# Patient Record
Sex: Female | Born: 1955
Health system: Southern US, Community
[De-identification: ages and names within clinical notes are randomized; demographics above are authoritative.]

## PROBLEM LIST (undated history)

## (undated) DIAGNOSIS — E079 Disorder of thyroid, unspecified: Secondary | ICD-10-CM

## (undated) DIAGNOSIS — N809 Endometriosis, unspecified: Secondary | ICD-10-CM

## (undated) DIAGNOSIS — D649 Anemia, unspecified: Secondary | ICD-10-CM

## (undated) DIAGNOSIS — J45909 Unspecified asthma, uncomplicated: Secondary | ICD-10-CM

## (undated) DIAGNOSIS — I10 Essential (primary) hypertension: Secondary | ICD-10-CM

## (undated) DIAGNOSIS — E119 Type 2 diabetes mellitus without complications: Secondary | ICD-10-CM

## (undated) DIAGNOSIS — K219 Gastro-esophageal reflux disease without esophagitis: Secondary | ICD-10-CM

## (undated) DIAGNOSIS — E039 Hypothyroidism, unspecified: Secondary | ICD-10-CM

## (undated) DIAGNOSIS — F419 Anxiety disorder, unspecified: Secondary | ICD-10-CM

## (undated) DIAGNOSIS — J449 Chronic obstructive pulmonary disease, unspecified: Secondary | ICD-10-CM

## (undated) DIAGNOSIS — F32A Depression, unspecified: Secondary | ICD-10-CM

## (undated) DIAGNOSIS — G4730 Sleep apnea, unspecified: Secondary | ICD-10-CM

## (undated) DIAGNOSIS — K259 Gastric ulcer, unspecified as acute or chronic, without hemorrhage or perforation: Secondary | ICD-10-CM

## (undated) HISTORY — DX: Chronic obstructive pulmonary disease, unspecified: J44.9

## (undated) HISTORY — DX: Endometriosis, unspecified: N80.9

## (undated) HISTORY — DX: Disorder of thyroid, unspecified: E07.9

## (undated) HISTORY — DX: Essential (primary) hypertension: I10

## (undated) HISTORY — DX: Anxiety disorder, unspecified: F41.9

## (undated) HISTORY — DX: Type 2 diabetes mellitus without complications: E11.9

## (undated) HISTORY — DX: Depression, unspecified: F32.A

## (undated) HISTORY — PX: TUBAL LIGATION: SHX77

## (undated) HISTORY — DX: Anemia, unspecified: D64.9

---

## 2016-07-10 DIAGNOSIS — Z6841 Body Mass Index (BMI) 40.0 and over, adult: Secondary | ICD-10-CM | POA: Diagnosis not present

## 2016-07-10 DIAGNOSIS — E119 Type 2 diabetes mellitus without complications: Secondary | ICD-10-CM | POA: Diagnosis not present

## 2016-07-10 DIAGNOSIS — J9610 Chronic respiratory failure, unspecified whether with hypoxia or hypercapnia: Secondary | ICD-10-CM | POA: Diagnosis not present

## 2016-07-10 DIAGNOSIS — K279 Peptic ulcer, site unspecified, unspecified as acute or chronic, without hemorrhage or perforation: Secondary | ICD-10-CM | POA: Diagnosis not present

## 2016-07-10 DIAGNOSIS — I10 Essential (primary) hypertension: Secondary | ICD-10-CM | POA: Diagnosis not present

## 2016-07-10 DIAGNOSIS — E039 Hypothyroidism, unspecified: Secondary | ICD-10-CM | POA: Diagnosis not present

## 2016-07-10 DIAGNOSIS — J439 Emphysema, unspecified: Secondary | ICD-10-CM | POA: Diagnosis not present

## 2016-07-10 DIAGNOSIS — Z1389 Encounter for screening for other disorder: Secondary | ICD-10-CM | POA: Diagnosis not present

## 2016-07-10 DIAGNOSIS — Z23 Encounter for immunization: Secondary | ICD-10-CM | POA: Diagnosis not present

## 2016-08-07 DIAGNOSIS — J439 Emphysema, unspecified: Secondary | ICD-10-CM | POA: Diagnosis not present

## 2016-08-07 DIAGNOSIS — Z6841 Body Mass Index (BMI) 40.0 and over, adult: Secondary | ICD-10-CM | POA: Diagnosis not present

## 2016-08-07 DIAGNOSIS — E7800 Pure hypercholesterolemia, unspecified: Secondary | ICD-10-CM | POA: Diagnosis not present

## 2016-08-07 DIAGNOSIS — I10 Essential (primary) hypertension: Secondary | ICD-10-CM | POA: Diagnosis not present

## 2016-08-07 DIAGNOSIS — E039 Hypothyroidism, unspecified: Secondary | ICD-10-CM | POA: Diagnosis not present

## 2016-08-07 DIAGNOSIS — F329 Major depressive disorder, single episode, unspecified: Secondary | ICD-10-CM | POA: Diagnosis not present

## 2016-08-07 DIAGNOSIS — E119 Type 2 diabetes mellitus without complications: Secondary | ICD-10-CM | POA: Diagnosis not present

## 2016-12-09 DIAGNOSIS — Z6839 Body mass index (BMI) 39.0-39.9, adult: Secondary | ICD-10-CM | POA: Diagnosis not present

## 2016-12-09 DIAGNOSIS — E119 Type 2 diabetes mellitus without complications: Secondary | ICD-10-CM | POA: Diagnosis not present

## 2016-12-09 DIAGNOSIS — E039 Hypothyroidism, unspecified: Secondary | ICD-10-CM | POA: Diagnosis not present

## 2016-12-09 DIAGNOSIS — F329 Major depressive disorder, single episode, unspecified: Secondary | ICD-10-CM | POA: Diagnosis not present

## 2016-12-09 DIAGNOSIS — Z1389 Encounter for screening for other disorder: Secondary | ICD-10-CM | POA: Diagnosis not present

## 2016-12-09 DIAGNOSIS — I10 Essential (primary) hypertension: Secondary | ICD-10-CM | POA: Diagnosis not present

## 2016-12-09 DIAGNOSIS — E669 Obesity, unspecified: Secondary | ICD-10-CM | POA: Diagnosis not present

## 2016-12-09 DIAGNOSIS — J439 Emphysema, unspecified: Secondary | ICD-10-CM | POA: Diagnosis not present

## 2017-01-12 DIAGNOSIS — E876 Hypokalemia: Secondary | ICD-10-CM | POA: Diagnosis not present

## 2017-04-14 DIAGNOSIS — J9610 Chronic respiratory failure, unspecified whether with hypoxia or hypercapnia: Secondary | ICD-10-CM | POA: Diagnosis not present

## 2017-04-14 DIAGNOSIS — Z0183 Encounter for blood typing: Secondary | ICD-10-CM | POA: Diagnosis not present

## 2017-04-14 DIAGNOSIS — J439 Emphysema, unspecified: Secondary | ICD-10-CM | POA: Diagnosis not present

## 2017-04-14 DIAGNOSIS — E039 Hypothyroidism, unspecified: Secondary | ICD-10-CM | POA: Diagnosis not present

## 2017-04-14 DIAGNOSIS — I10 Essential (primary) hypertension: Secondary | ICD-10-CM | POA: Diagnosis not present

## 2017-04-14 DIAGNOSIS — G250 Essential tremor: Secondary | ICD-10-CM | POA: Diagnosis not present

## 2017-04-14 DIAGNOSIS — E119 Type 2 diabetes mellitus without complications: Secondary | ICD-10-CM | POA: Diagnosis not present

## 2017-04-14 DIAGNOSIS — Z23 Encounter for immunization: Secondary | ICD-10-CM | POA: Diagnosis not present

## 2017-04-14 DIAGNOSIS — F329 Major depressive disorder, single episode, unspecified: Secondary | ICD-10-CM | POA: Diagnosis not present

## 2017-04-14 DIAGNOSIS — E7800 Pure hypercholesterolemia, unspecified: Secondary | ICD-10-CM | POA: Diagnosis not present

## 2017-08-20 DIAGNOSIS — E119 Type 2 diabetes mellitus without complications: Secondary | ICD-10-CM | POA: Diagnosis not present

## 2017-08-20 DIAGNOSIS — F329 Major depressive disorder, single episode, unspecified: Secondary | ICD-10-CM | POA: Diagnosis not present

## 2017-08-20 DIAGNOSIS — I10 Essential (primary) hypertension: Secondary | ICD-10-CM | POA: Diagnosis not present

## 2017-08-20 DIAGNOSIS — Z1211 Encounter for screening for malignant neoplasm of colon: Secondary | ICD-10-CM | POA: Diagnosis not present

## 2017-08-20 DIAGNOSIS — E7800 Pure hypercholesterolemia, unspecified: Secondary | ICD-10-CM | POA: Diagnosis not present

## 2017-08-20 DIAGNOSIS — M549 Dorsalgia, unspecified: Secondary | ICD-10-CM | POA: Diagnosis not present

## 2017-08-20 DIAGNOSIS — E039 Hypothyroidism, unspecified: Secondary | ICD-10-CM | POA: Diagnosis not present

## 2017-08-26 DIAGNOSIS — Z1211 Encounter for screening for malignant neoplasm of colon: Secondary | ICD-10-CM | POA: Diagnosis not present

## 2017-08-26 DIAGNOSIS — Z1212 Encounter for screening for malignant neoplasm of rectum: Secondary | ICD-10-CM | POA: Diagnosis not present

## 2017-10-01 DIAGNOSIS — Z1211 Encounter for screening for malignant neoplasm of colon: Secondary | ICD-10-CM | POA: Diagnosis not present

## 2017-10-14 DIAGNOSIS — K5732 Diverticulitis of large intestine without perforation or abscess without bleeding: Secondary | ICD-10-CM | POA: Diagnosis not present

## 2017-10-14 DIAGNOSIS — J449 Chronic obstructive pulmonary disease, unspecified: Secondary | ICD-10-CM | POA: Diagnosis not present

## 2017-10-14 DIAGNOSIS — D124 Benign neoplasm of descending colon: Secondary | ICD-10-CM | POA: Diagnosis not present

## 2017-10-14 DIAGNOSIS — E119 Type 2 diabetes mellitus without complications: Secondary | ICD-10-CM | POA: Diagnosis not present

## 2017-10-14 DIAGNOSIS — D127 Benign neoplasm of rectosigmoid junction: Secondary | ICD-10-CM | POA: Diagnosis not present

## 2017-10-14 DIAGNOSIS — K5730 Diverticulosis of large intestine without perforation or abscess without bleeding: Secondary | ICD-10-CM | POA: Diagnosis not present

## 2017-10-14 DIAGNOSIS — K5790 Diverticulosis of intestine, part unspecified, without perforation or abscess without bleeding: Secondary | ICD-10-CM | POA: Diagnosis not present

## 2017-10-14 DIAGNOSIS — R195 Other fecal abnormalities: Secondary | ICD-10-CM | POA: Diagnosis not present

## 2017-10-14 DIAGNOSIS — D369 Benign neoplasm, unspecified site: Secondary | ICD-10-CM | POA: Diagnosis not present

## 2017-10-14 DIAGNOSIS — K621 Rectal polyp: Secondary | ICD-10-CM | POA: Diagnosis not present

## 2017-10-14 DIAGNOSIS — K635 Polyp of colon: Secondary | ICD-10-CM | POA: Diagnosis not present

## 2017-10-14 DIAGNOSIS — K644 Residual hemorrhoidal skin tags: Secondary | ICD-10-CM | POA: Diagnosis not present

## 2017-10-14 DIAGNOSIS — K648 Other hemorrhoids: Secondary | ICD-10-CM | POA: Diagnosis not present

## 2017-10-14 DIAGNOSIS — Z1211 Encounter for screening for malignant neoplasm of colon: Secondary | ICD-10-CM | POA: Diagnosis not present

## 2017-10-14 DIAGNOSIS — E785 Hyperlipidemia, unspecified: Secondary | ICD-10-CM | POA: Diagnosis not present

## 2017-10-14 LAB — HM COLONOSCOPY

## 2017-11-25 DIAGNOSIS — Z6841 Body Mass Index (BMI) 40.0 and over, adult: Secondary | ICD-10-CM | POA: Diagnosis not present

## 2017-11-25 DIAGNOSIS — L259 Unspecified contact dermatitis, unspecified cause: Secondary | ICD-10-CM | POA: Diagnosis not present

## 2017-12-28 DIAGNOSIS — E119 Type 2 diabetes mellitus without complications: Secondary | ICD-10-CM | POA: Diagnosis not present

## 2017-12-28 DIAGNOSIS — Z79899 Other long term (current) drug therapy: Secondary | ICD-10-CM | POA: Diagnosis not present

## 2017-12-28 DIAGNOSIS — J439 Emphysema, unspecified: Secondary | ICD-10-CM | POA: Diagnosis not present

## 2017-12-28 DIAGNOSIS — Z1339 Encounter for screening examination for other mental health and behavioral disorders: Secondary | ICD-10-CM | POA: Diagnosis not present

## 2017-12-28 DIAGNOSIS — E039 Hypothyroidism, unspecified: Secondary | ICD-10-CM | POA: Diagnosis not present

## 2017-12-28 DIAGNOSIS — Z1331 Encounter for screening for depression: Secondary | ICD-10-CM | POA: Diagnosis not present

## 2017-12-28 DIAGNOSIS — Z1159 Encounter for screening for other viral diseases: Secondary | ICD-10-CM | POA: Diagnosis not present

## 2017-12-28 DIAGNOSIS — M549 Dorsalgia, unspecified: Secondary | ICD-10-CM | POA: Diagnosis not present

## 2017-12-28 DIAGNOSIS — I10 Essential (primary) hypertension: Secondary | ICD-10-CM | POA: Diagnosis not present

## 2017-12-28 DIAGNOSIS — E7800 Pure hypercholesterolemia, unspecified: Secondary | ICD-10-CM | POA: Diagnosis not present

## 2017-12-28 DIAGNOSIS — J9610 Chronic respiratory failure, unspecified whether with hypoxia or hypercapnia: Secondary | ICD-10-CM | POA: Diagnosis not present

## 2018-01-29 DIAGNOSIS — Z6841 Body Mass Index (BMI) 40.0 and over, adult: Secondary | ICD-10-CM | POA: Diagnosis not present

## 2018-01-29 DIAGNOSIS — L309 Dermatitis, unspecified: Secondary | ICD-10-CM | POA: Diagnosis not present

## 2018-01-29 DIAGNOSIS — R05 Cough: Secondary | ICD-10-CM | POA: Diagnosis not present

## 2018-01-29 DIAGNOSIS — E039 Hypothyroidism, unspecified: Secondary | ICD-10-CM | POA: Diagnosis not present

## 2018-01-29 DIAGNOSIS — F329 Major depressive disorder, single episode, unspecified: Secondary | ICD-10-CM | POA: Diagnosis not present

## 2018-01-29 DIAGNOSIS — J439 Emphysema, unspecified: Secondary | ICD-10-CM | POA: Diagnosis not present

## 2018-01-29 DIAGNOSIS — K219 Gastro-esophageal reflux disease without esophagitis: Secondary | ICD-10-CM | POA: Diagnosis not present

## 2018-03-02 DIAGNOSIS — J439 Emphysema, unspecified: Secondary | ICD-10-CM | POA: Diagnosis not present

## 2018-03-02 DIAGNOSIS — Z6841 Body Mass Index (BMI) 40.0 and over, adult: Secondary | ICD-10-CM | POA: Diagnosis not present

## 2018-03-02 DIAGNOSIS — L309 Dermatitis, unspecified: Secondary | ICD-10-CM | POA: Diagnosis not present

## 2018-03-02 DIAGNOSIS — Z79899 Other long term (current) drug therapy: Secondary | ICD-10-CM | POA: Diagnosis not present

## 2018-03-02 DIAGNOSIS — R05 Cough: Secondary | ICD-10-CM | POA: Diagnosis not present

## 2018-03-02 DIAGNOSIS — E1140 Type 2 diabetes mellitus with diabetic neuropathy, unspecified: Secondary | ICD-10-CM | POA: Diagnosis not present

## 2018-03-02 DIAGNOSIS — Z23 Encounter for immunization: Secondary | ICD-10-CM | POA: Diagnosis not present

## 2018-03-02 DIAGNOSIS — G5790 Unspecified mononeuropathy of unspecified lower limb: Secondary | ICD-10-CM | POA: Diagnosis not present

## 2018-03-02 DIAGNOSIS — K219 Gastro-esophageal reflux disease without esophagitis: Secondary | ICD-10-CM | POA: Diagnosis not present

## 2018-03-17 DIAGNOSIS — J439 Emphysema, unspecified: Secondary | ICD-10-CM | POA: Diagnosis not present

## 2018-03-17 DIAGNOSIS — J9610 Chronic respiratory failure, unspecified whether with hypoxia or hypercapnia: Secondary | ICD-10-CM | POA: Diagnosis not present

## 2018-03-17 DIAGNOSIS — L309 Dermatitis, unspecified: Secondary | ICD-10-CM | POA: Diagnosis not present

## 2018-03-17 DIAGNOSIS — M549 Dorsalgia, unspecified: Secondary | ICD-10-CM | POA: Diagnosis not present

## 2018-03-17 DIAGNOSIS — Z6841 Body Mass Index (BMI) 40.0 and over, adult: Secondary | ICD-10-CM | POA: Diagnosis not present

## 2018-05-06 DIAGNOSIS — J439 Emphysema, unspecified: Secondary | ICD-10-CM | POA: Diagnosis not present

## 2018-05-06 DIAGNOSIS — D229 Melanocytic nevi, unspecified: Secondary | ICD-10-CM | POA: Diagnosis not present

## 2018-05-06 DIAGNOSIS — Z6841 Body Mass Index (BMI) 40.0 and over, adult: Secondary | ICD-10-CM | POA: Diagnosis not present

## 2018-05-06 DIAGNOSIS — D179 Benign lipomatous neoplasm, unspecified: Secondary | ICD-10-CM | POA: Diagnosis not present

## 2018-05-13 DIAGNOSIS — J449 Chronic obstructive pulmonary disease, unspecified: Secondary | ICD-10-CM | POA: Diagnosis not present

## 2018-06-13 DIAGNOSIS — J449 Chronic obstructive pulmonary disease, unspecified: Secondary | ICD-10-CM | POA: Diagnosis not present

## 2018-07-06 DIAGNOSIS — E119 Type 2 diabetes mellitus without complications: Secondary | ICD-10-CM | POA: Diagnosis not present

## 2018-07-06 DIAGNOSIS — E039 Hypothyroidism, unspecified: Secondary | ICD-10-CM | POA: Diagnosis not present

## 2018-07-06 DIAGNOSIS — J9610 Chronic respiratory failure, unspecified whether with hypoxia or hypercapnia: Secondary | ICD-10-CM | POA: Diagnosis not present

## 2018-07-06 DIAGNOSIS — K219 Gastro-esophageal reflux disease without esophagitis: Secondary | ICD-10-CM | POA: Diagnosis not present

## 2018-07-06 DIAGNOSIS — G629 Polyneuropathy, unspecified: Secondary | ICD-10-CM | POA: Diagnosis not present

## 2018-07-06 DIAGNOSIS — E7800 Pure hypercholesterolemia, unspecified: Secondary | ICD-10-CM | POA: Diagnosis not present

## 2018-07-06 DIAGNOSIS — Z79899 Other long term (current) drug therapy: Secondary | ICD-10-CM | POA: Diagnosis not present

## 2018-07-06 DIAGNOSIS — J439 Emphysema, unspecified: Secondary | ICD-10-CM | POA: Diagnosis not present

## 2018-07-06 DIAGNOSIS — F329 Major depressive disorder, single episode, unspecified: Secondary | ICD-10-CM | POA: Diagnosis not present

## 2018-07-06 DIAGNOSIS — I10 Essential (primary) hypertension: Secondary | ICD-10-CM | POA: Diagnosis not present

## 2018-07-06 DIAGNOSIS — M549 Dorsalgia, unspecified: Secondary | ICD-10-CM | POA: Diagnosis not present

## 2018-07-13 DIAGNOSIS — R0602 Shortness of breath: Secondary | ICD-10-CM | POA: Diagnosis not present

## 2018-07-13 DIAGNOSIS — G4733 Obstructive sleep apnea (adult) (pediatric): Secondary | ICD-10-CM | POA: Diagnosis not present

## 2018-07-14 DIAGNOSIS — R0602 Shortness of breath: Secondary | ICD-10-CM | POA: Diagnosis not present

## 2018-07-14 DIAGNOSIS — G4733 Obstructive sleep apnea (adult) (pediatric): Secondary | ICD-10-CM | POA: Diagnosis not present

## 2018-07-14 DIAGNOSIS — J449 Chronic obstructive pulmonary disease, unspecified: Secondary | ICD-10-CM | POA: Diagnosis not present

## 2018-08-12 DIAGNOSIS — J449 Chronic obstructive pulmonary disease, unspecified: Secondary | ICD-10-CM | POA: Diagnosis not present

## 2018-08-17 DIAGNOSIS — E039 Hypothyroidism, unspecified: Secondary | ICD-10-CM | POA: Diagnosis not present

## 2018-08-17 DIAGNOSIS — M549 Dorsalgia, unspecified: Secondary | ICD-10-CM | POA: Diagnosis not present

## 2018-08-17 DIAGNOSIS — Z720 Tobacco use: Secondary | ICD-10-CM | POA: Diagnosis not present

## 2018-08-17 DIAGNOSIS — J309 Allergic rhinitis, unspecified: Secondary | ICD-10-CM | POA: Diagnosis not present

## 2020-05-07 ENCOUNTER — Other Ambulatory Visit: Payer: Self-pay | Admitting: Family Medicine

## 2020-05-07 ENCOUNTER — Ambulatory Visit (INDEPENDENT_AMBULATORY_CARE_PROVIDER_SITE_OTHER): Payer: Medicare Other | Admitting: Family Medicine

## 2020-05-07 ENCOUNTER — Other Ambulatory Visit: Payer: Self-pay

## 2020-05-07 ENCOUNTER — Encounter: Payer: Self-pay | Admitting: Family Medicine

## 2020-05-07 VITALS — BP 130/88 | HR 73 | Temp 96.3°F | Ht 67.0 in | Wt 270.0 lb

## 2020-05-07 DIAGNOSIS — Z23 Encounter for immunization: Secondary | ICD-10-CM

## 2020-05-07 DIAGNOSIS — E1169 Type 2 diabetes mellitus with other specified complication: Secondary | ICD-10-CM | POA: Diagnosis not present

## 2020-05-07 DIAGNOSIS — I152 Hypertension secondary to endocrine disorders: Secondary | ICD-10-CM

## 2020-05-07 DIAGNOSIS — E039 Hypothyroidism, unspecified: Secondary | ICD-10-CM

## 2020-05-07 DIAGNOSIS — K219 Gastro-esophageal reflux disease without esophagitis: Secondary | ICD-10-CM | POA: Insufficient documentation

## 2020-05-07 DIAGNOSIS — G629 Polyneuropathy, unspecified: Secondary | ICD-10-CM | POA: Insufficient documentation

## 2020-05-07 DIAGNOSIS — E1159 Type 2 diabetes mellitus with other circulatory complications: Secondary | ICD-10-CM

## 2020-05-07 DIAGNOSIS — J449 Chronic obstructive pulmonary disease, unspecified: Secondary | ICD-10-CM

## 2020-05-07 DIAGNOSIS — Z720 Tobacco use: Secondary | ICD-10-CM | POA: Insufficient documentation

## 2020-05-07 DIAGNOSIS — F321 Major depressive disorder, single episode, moderate: Secondary | ICD-10-CM

## 2020-05-07 DIAGNOSIS — G8929 Other chronic pain: Secondary | ICD-10-CM | POA: Insufficient documentation

## 2020-05-07 DIAGNOSIS — E1122 Type 2 diabetes mellitus with diabetic chronic kidney disease: Secondary | ICD-10-CM | POA: Insufficient documentation

## 2020-05-07 DIAGNOSIS — L609 Nail disorder, unspecified: Secondary | ICD-10-CM

## 2020-05-07 LAB — T4, FREE: Free T4: 0.94 ng/dL (ref 0.60–1.60)

## 2020-05-07 LAB — TSH: TSH: 2.33 u[IU]/mL (ref 0.35–4.50)

## 2020-05-07 LAB — T3, FREE: T3, Free: 3.5 pg/mL (ref 2.3–4.2)

## 2020-05-07 MED ORDER — BUPROPION HCL ER (SR) 150 MG PO TB12: ORAL_TABLET | ORAL | 1 refills | Status: DC

## 2020-05-07 NOTE — Progress Notes (Signed)
Subjective:     Valerie Lamb is a 64 y.o. female presenting for Establish Care (concerns about her thyroid ) and Immunizations (Tdap)     HPI   #Depression  - husband was diagnosed with liver cancer in February - sisters have cancer now - first marriage was not healthy  #GERD - tried stopping for 1 month but severe symptoms  #Tobacco use - tried chantix w/ improvement - but when she stopped this she started smoking more - interested in completely quitting  #Nail changes - 6 months - did have her thyoid medications in that time   Review of Systems   Social History   Tobacco Use  Smoking Status Current Every Day Smoker  . Packs/day: 0.25  . Years: 46.00  . Pack years: 11.50  Smokeless Tobacco Never Used  Tobacco Comment   2 ppd previously, cut back 2021        Objective:    BP Readings from Last 3 Encounters:  05/07/20 130/88   Wt Readings from Last 3 Encounters:  05/07/20 270 lb (122.5 kg)    BP 130/88   Pulse 73   Temp (!) 96.3 F (35.7 C) (Temporal)   Ht 5\' 7"  (1.702 m)   Wt 270 lb (122.5 kg)   SpO2 96%   BMI 42.29 kg/m    Physical Exam Constitutional:      General: She is not in acute distress.    Appearance: She is well-developed. She is not diaphoretic.  HENT:     Right Ear: External ear normal.     Left Ear: External ear normal.     Nose: Nose normal.  Eyes:     Conjunctiva/sclera: Conjunctivae normal.  Cardiovascular:     Rate and Rhythm: Normal rate and regular rhythm.     Heart sounds: No murmur heard.   Pulmonary:     Effort: Pulmonary effort is normal. No respiratory distress.     Breath sounds: Normal breath sounds. No wheezing.  Musculoskeletal:     Cervical back: Neck supple.  Skin:    General: Skin is warm and dry.     Capillary Refill: Capillary refill takes less than 2 seconds.  Neurological:     Mental Status: She is alert. Mental status is at baseline.  Psychiatric:        Mood and Affect: Mood normal.         Behavior: Behavior normal.     Depression screen PHQ 2/9 05/07/2020  Decreased Interest 3  Down, Depressed, Hopeless 2  PHQ - 2 Score 5  Altered sleeping 3  Tired, decreased energy 3  Change in appetite 3  Feeling bad or failure about yourself  3  Trouble concentrating 3  Moving slowly or fidgety/restless 2  Suicidal thoughts 0  PHQ-9 Score 22  Difficult doing work/chores Somewhat difficult        Assessment & Plan:   Problem List Items Addressed This Visit      Cardiovascular and Mediastinum   Hypertension associated with diabetes ([place])    BP at goal on HCTZ 25 mg. Cont but may consider switching to ACE or ARB given diabetes      Relevant Medications   glipiZIDE (GLUCOTROL) 5 MG tablet   hydrochlorothiazide (HYDRODIURIL) 25 MG tablet   pioglitazone (ACTOS) 30 MG tablet     Respiratory   COPD (chronic obstructive pulmonary disease) (HCC)    Stable. Cont tiotropium daily. Work on quitting smoking.       Relevant  Medications   Tiotropium Bromide Monohydrate 2.5 MCG/ACT AERS   albuterol (VENTOLIN HFA) 108 (90 Base) MCG/ACT inhaler     Digestive   GERD (gastroesophageal reflux disease)    Suspect worse with tobacco use. Cont omeprazole. Smoking cessation      Relevant Medications   omeprazole (PRILOSEC) 20 MG capsule     Endocrine   Hypothyroidism    Brittle nails, will recheck. Cont levothyroxine 100 mcg daily.       Relevant Medications   levothyroxine (SYNTHROID) 100 MCG tablet   Other Relevant Orders   TSH   T4, free   T3, free   Type 2 diabetes mellitus with other specified complication ([place])    Encouraged diabetic diet. C/b HTN and neuropathy. Cont Actos 30 mg and gabapentin 5 mg.       Relevant Medications   glipiZIDE (GLUCOTROL) 5 MG tablet   pioglitazone (ACTOS) 30 MG tablet     Nervous and Auditory   Neuropathy    Discussed OK to increase gabapentin 600 mg to BID.         Other   Current moderate episode of major depressive  disorder without prior episode (HCC) - Primary    PHQ-9 elevated for months in setting of husband's cancer diagnosis. Encouraged therapy and will start Wellbutrin to help with smoking cessation. Return in 6 weeks.      Relevant Medications   buPROPion (WELLBUTRIN SR) 150 MG 12 hr tablet   Tobacco abuse    Wellbutrin for cessation support       Other Visit Diagnoses    Need for Tdap vaccination       Relevant Orders   Tdap vaccine greater than or equal to 7yo IM (Completed)       Return in about 6 weeks (around 06/18/2020) for depression and diabetes.  Lesleigh Noe, MD  This visit occurred during the SARS-CoV-2 public health emergency.  Safety protocols were in place, including screening questions prior to the visit, additional usage of staff PPE, and extensive cleaning of exam room while observing appropriate contact time as indicated for disinfecting solutions.

## 2020-05-07 NOTE — Assessment & Plan Note (Signed)
PHQ-9 elevated for months in setting of husband's cancer diagnosis. Encouraged therapy and will start Wellbutrin to help with smoking cessation. Return in 6 weeks.

## 2020-05-07 NOTE — Patient Instructions (Signed)
Diabetes - try to follow diabetic diet - we will plan to check your diabetes at our next visit - continue medication    Wellbutrin - for smoking - set a quit date 2 weeks after starting - for depression see below    You are going to start a new antidepressant medication.   One of the risks of this medication is increase in suicidal thoughts.   Your suicide Action plan is as follows:  1) Call family 2) Call the Suicide Hotline [phone]) [phone] which is available 24 hours 3) Call the Clinic    The most common side effect is stomach upset. If this happens it means the medication is working. It should get better in 1-3 weeks.   Medication for depression and anxiety often takes 6-8 weeks to have a noticeable difference so stick with it. Also the best way for recovery is taking medication and seeing a therapist -- this is so important.    How to help anxiety and depression  1) Regular Exercise - walking, jogging, cycling, dancing, strength training - aiming for 150 minutes of exercise a week --> Yoga has been shown in research to reduce depression and anxiety -- with even just one hour long session per week  2)  Begin a Mindfulness/Meditation practice -- this can take a little as 3 minutes and is helpful for all kinds of mood issues -- You can find resources in books -- Or you can download apps like  ---- Headspace App  ---- Calm  ---- Insignt Timer ---- Stop, Breathe & Think  # With each of these Apps - you should decline the "start free trial" offer and as you search through the App should be able to access some of their free content. You can also chose to pay for the content if you find one that works well for you.   # Many of them also offer sleep specific content which may help with insomnia  3) Healthy Diet -- Avoid or decrease Caffeine -- Avoid or decrease Alcohol -- Drink plenty of water, have a balanced diet -- Avoid cigarettes and marijuana (as well as other  recreational drugs)  4) Find a therapist  -- [facility] is one option. Call [phone] -- Or you can check out www.psychologytoday.com -- you can read bios of therapists and see if they accept insurance -- Check with your insurance to see if you have coverage and who may take your insurance

## 2020-05-07 NOTE — Assessment & Plan Note (Signed)
Stable. Cont tiotropium daily. Work on quitting smoking.

## 2020-05-07 NOTE — Assessment & Plan Note (Signed)
Encouraged diabetic diet. C/b HTN and neuropathy. Cont Actos 30 mg and gabapentin 5 mg.

## 2020-05-07 NOTE — Assessment & Plan Note (Signed)
Suspect worse with tobacco use. Cont omeprazole. Smoking cessation

## 2020-05-07 NOTE — Assessment & Plan Note (Signed)
BP at goal on HCTZ 25 mg. Cont but may consider switching to ACE or ARB given diabetes

## 2020-05-07 NOTE — Assessment & Plan Note (Signed)
Brittle nails, will recheck. Cont levothyroxine 100 mcg daily.

## 2020-05-07 NOTE — Assessment & Plan Note (Signed)
Discussed OK to increase gabapentin 600 mg to BID.

## 2020-05-07 NOTE — Assessment & Plan Note (Signed)
Wellbutrin for cessation support

## 2020-05-10 DIAGNOSIS — N950 Postmenopausal bleeding: Secondary | ICD-10-CM | POA: Insufficient documentation

## 2020-05-15 DIAGNOSIS — N8501 Benign endometrial hyperplasia: Secondary | ICD-10-CM | POA: Insufficient documentation

## 2020-05-30 ENCOUNTER — Other Ambulatory Visit: Payer: Self-pay | Admitting: Family Medicine

## 2020-05-30 DIAGNOSIS — F321 Major depressive disorder, single episode, moderate: Secondary | ICD-10-CM

## 2020-06-05 ENCOUNTER — Telehealth: Payer: Self-pay

## 2020-06-05 NOTE — Telephone Encounter (Signed)
Pt left v/m requesting refill omeprazole 40 mg taking one cap bid to [facility].

## 2020-06-06 ENCOUNTER — Encounter: Payer: Self-pay | Admitting: Family Medicine

## 2020-06-06 MED ORDER — OMEPRAZOLE 20 MG PO CPDR
40.0000 mg | DELAYED_RELEASE_CAPSULE | Freq: Two times a day (BID) | ORAL | 0 refills | Status: DC
Start: 2020-06-06 — End: 2020-06-29

## 2020-06-06 NOTE — Telephone Encounter (Signed)
Will send in 30 day supply of 40 mg BID. Will discuss reducing dose at our follow-up visit in a few weeks

## 2020-06-06 NOTE — Telephone Encounter (Signed)
This medication is on pt's med list as 40 mg once a day but they are requesting an amount for BID. Last filled by historical provider. Please advise.

## 2020-06-12 DIAGNOSIS — Z01818 Encounter for other preprocedural examination: Secondary | ICD-10-CM | POA: Diagnosis not present

## 2020-06-15 ENCOUNTER — Encounter: Payer: Self-pay | Admitting: Family Medicine

## 2020-06-19 ENCOUNTER — Encounter: Payer: Self-pay | Admitting: Family Medicine

## 2020-06-25 ENCOUNTER — Telehealth (INDEPENDENT_AMBULATORY_CARE_PROVIDER_SITE_OTHER): Payer: Medicare Other | Admitting: Family Medicine

## 2020-06-25 DIAGNOSIS — G4700 Insomnia, unspecified: Secondary | ICD-10-CM | POA: Diagnosis not present

## 2020-06-25 DIAGNOSIS — Z720 Tobacco use: Secondary | ICD-10-CM

## 2020-06-25 DIAGNOSIS — F321 Major depressive disorder, single episode, moderate: Secondary | ICD-10-CM

## 2020-06-25 MED ORDER — ALPRAZOLAM 0.25 MG PO TABS: 0.2500 mg | ORAL_TABLET | Freq: Every day | ORAL | 0 refills | Status: DC | PRN

## 2020-06-25 NOTE — Assessment & Plan Note (Signed)
Chronic and worsening - difficulty staying asleep. Advised nicotine reduction. Trial of melatonin then unisom. Will consider trazodone if not responding.

## 2020-06-25 NOTE — Assessment & Plan Note (Signed)
Husband likely transitioning to hospice care. More anger. No change with wellbutrin. Will stop. Xanax for extremely bad days. Will consider SSRI at follow-up. Strongly encouraged therapy and support groups - she is going to consider.

## 2020-06-25 NOTE — Progress Notes (Signed)
I connected with Valerie Lamb on 06/25/20 at  9:20 AM EST by video and verified that I am speaking with the correct person using two identifiers.   I discussed the limitations, risks, security and privacy concerns of performing an evaluation and management service by video and the availability of in person appointments. I also discussed with the patient that there may be a patient responsible charge related to this service. The patient expressed understanding and agreed to proceed.  Patient location: Home Provider Location: [place] Participants: Lesleigh Noe and Valerie Lamb   Subjective:     Valerie Lamb is a 65 y.o. female presenting for Follow-up (6 week- having bursts of anger ), Numbness (Pinky and ring finger on L hand x 2 weeks ), Tremors (Both hands x few weeks ), and Insomnia     HPI   #Burst of anger - wellbutrin was started in December  - has been smoking more than normal - that is her escape which only works for a few minutes - husband went to the hospital over the weekend - and is likely going to hospice care - losing her temper quickly and this resolves quickly as well - can be sitting there and the dog or her husband will set her off  - has is starting to find a hospice group  - has not done therapy at this time - is working with hospice  No change with wellbutrin  #insomnia - months/years - was not able to address at the last office visit - waking up every 1/2 hour and is awake for 2-3 hours - last cig before bed - does not get out of bed in the middle of the night to smoke, last cig is 6-7 pm and bedtime 10 pm - treatments: warm milk, dark room, no TV, covers clock - has not tried melatonin   Review of Systems   05/07/2020: Clinic - HTN - at goal (consider ACE). Depression - husband w/ cancer. Wellbutrin.   Social History   Tobacco Use  Smoking Status Current Every Day Smoker  . Packs/day: 0.25  . Years: 46.00  . Pack years:  11.50  Smokeless Tobacco Never Used  Tobacco Comment   2 ppd previously, cut back 2021        Objective:   BP Readings from Last 3 Encounters:  05/07/20 130/88   Wt Readings from Last 3 Encounters:  05/07/20 270 lb (122.5 kg)   There were no vitals taken for this visit.   Physical Exam Constitutional:      Appearance: Normal appearance. She is not ill-appearing.  HENT:     Head: Normocephalic and atraumatic.     Right Ear: External ear normal.     Left Ear: External ear normal.  Eyes:     Conjunctiva/sclera: Conjunctivae normal.  Pulmonary:     Effort: Pulmonary effort is normal. No respiratory distress.  Neurological:     Mental Status: She is alert. Mental status is at baseline.  Psychiatric:        Mood and Affect: Mood is depressed. Affect is tearful.        Behavior: Behavior normal.        Thought Content: Thought content normal.             Assessment & Plan:   Problem List Items Addressed This Visit      Other   Current moderate episode of major depressive disorder without prior episode ([place])  Husband likely transitioning to hospice care. More anger. No change with wellbutrin. Will stop. Xanax for extremely bad days. Will consider SSRI at follow-up. Strongly encouraged therapy and support groups - she is going to consider.       Tobacco abuse - Primary    More stress so has been unable to quit. Wellbutrin ineffective. Will readdress attempts to quit at follow-up when overall stress is lower.       Insomnia    Chronic and worsening - difficulty staying asleep. Advised nicotine reduction. Trial of melatonin then unisom. Will consider trazodone if not responding.         Due to virtual visit - advised in-person visit for numbness and tremor  Return for mychart message in 1 week. 2-3 weeks inperson.  Lesleigh Noe, MD

## 2020-06-25 NOTE — Assessment & Plan Note (Signed)
More stress so has been unable to quit. Wellbutrin ineffective. Will readdress attempts to quit at follow-up when overall stress is lower.

## 2020-06-25 NOTE — Patient Instructions (Signed)
#  insomnia - Try Melatonin 3-5 mg, take 1 hour before bed - If not effect - try over the counter Unisom (or store brand comparison)  Mychart next week if insomnia not improved  We can try Trazodone  You can also try the Xanax at night for sleep - but would avoid taking every night as can be habit forming   #anger - Stop wellbutrin - Take once a day for 3-4 days. Then stop  We may consider: Zoloft, Lexapro, or Prozac to help with anxiety/depression symptoms   Talk with hospice about support groups -- I think this will be helpful for you.

## 2020-06-27 ENCOUNTER — Other Ambulatory Visit: Payer: Self-pay | Admitting: Family Medicine

## 2020-06-27 DIAGNOSIS — F321 Major depressive disorder, single episode, moderate: Secondary | ICD-10-CM

## 2020-06-29 ENCOUNTER — Other Ambulatory Visit: Payer: Self-pay | Admitting: Family Medicine

## 2020-07-02 ENCOUNTER — Encounter: Payer: Medicare Other | Admitting: Obstetrics and Gynecology

## 2020-07-04 ENCOUNTER — Telehealth: Payer: Self-pay

## 2020-07-04 NOTE — Telephone Encounter (Signed)
Noted will see patient.

## 2020-07-04 NOTE — Telephone Encounter (Signed)
Valerie Lamb front office mgr received thru my chart a note pt had scheduled appt with Dr Einar Pheasant on 07/30/20 for numbness in fingers. I spoke with pt; starting 2 months ago lt little finger numbness constantly; no numbness in other fingers but numbness does go up hand and arm. Pt can grasp cup and no trouble walking and using lt leg. No H/A, dizziness, CP or SOB.Pt said had so much on her mind at 05/07/20 to establish care pt forgot to mention. Pt can only come in morning time for appt. Pt rescheduled appt for 07/12/20 at 12 noon; UC & ED precautions given and pt voiced understanding. Sending note to DR Einar Pheasant. No covid symptoms and no recent exposure to anyone with covid.

## 2020-07-05 ENCOUNTER — Encounter: Payer: Medicare Other | Admitting: Obstetrics and Gynecology

## 2020-07-12 ENCOUNTER — Other Ambulatory Visit: Payer: Self-pay | Admitting: Family Medicine

## 2020-07-12 ENCOUNTER — Other Ambulatory Visit: Payer: Self-pay

## 2020-07-12 ENCOUNTER — Ambulatory Visit (INDEPENDENT_AMBULATORY_CARE_PROVIDER_SITE_OTHER): Payer: Medicare Other | Admitting: Family Medicine

## 2020-07-12 VITALS — BP 142/82 | HR 96 | Temp 97.9°F | Ht 67.0 in | Wt 262.5 lb

## 2020-07-12 DIAGNOSIS — I152 Hypertension secondary to endocrine disorders: Secondary | ICD-10-CM | POA: Diagnosis not present

## 2020-07-12 DIAGNOSIS — R251 Tremor, unspecified: Secondary | ICD-10-CM | POA: Diagnosis not present

## 2020-07-12 DIAGNOSIS — E1169 Type 2 diabetes mellitus with other specified complication: Secondary | ICD-10-CM | POA: Diagnosis not present

## 2020-07-12 DIAGNOSIS — E1159 Type 2 diabetes mellitus with other circulatory complications: Secondary | ICD-10-CM

## 2020-07-12 DIAGNOSIS — R200 Anesthesia of skin: Secondary | ICD-10-CM | POA: Diagnosis not present

## 2020-07-12 DIAGNOSIS — N939 Abnormal uterine and vaginal bleeding, unspecified: Secondary | ICD-10-CM | POA: Insufficient documentation

## 2020-07-12 DIAGNOSIS — E785 Hyperlipidemia, unspecified: Secondary | ICD-10-CM | POA: Diagnosis not present

## 2020-07-12 DIAGNOSIS — F321 Major depressive disorder, single episode, moderate: Secondary | ICD-10-CM

## 2020-07-12 LAB — COMPREHENSIVE METABOLIC PANEL
ALT: 27 U/L (ref 0–35)
AST: 27 U/L (ref 0–37)
Albumin: 4.1 g/dL (ref 3.5–5.2)
Alkaline Phosphatase: 73 U/L (ref 39–117)
BUN: 11 mg/dL (ref 6–23)
CO2: 31 mEq/L (ref 19–32)
Calcium: 10.2 mg/dL (ref 8.4–10.5)
Chloride: 98 mEq/L (ref 96–112)
Creatinine, Ser: 1.01 mg/dL (ref 0.40–1.20)
GFR: 59.3 mL/min — ABNORMAL LOW (ref >60.00)
Glucose, Bld: 211 mg/dL — ABNORMAL HIGH (ref 70–99)
Potassium: 3.8 mEq/L (ref 3.5–5.1)
Sodium: 137 mEq/L (ref 135–145)
Total Bilirubin: 0.8 mg/dL (ref 0.2–1.2)
Total Protein: 7.1 g/dL (ref 6.0–8.3)

## 2020-07-12 LAB — HEMOGLOBIN A1C: Hgb A1c MFr Bld: 10.2 % — ABNORMAL HIGH (ref 4.6–6.5)

## 2020-07-12 LAB — LIPID PANEL
Cholesterol: 173 mg/dL (ref 0–200)
HDL: 41.9 mg/dL (ref >39.00)
LDL Cholesterol: 103 mg/dL — ABNORMAL HIGH (ref 0–99)
NonHDL: 131.07
Total CHOL/HDL Ratio: 4
Triglycerides: 138 mg/dL (ref 0.0–149.0)
VLDL: 27.6 mg/dL (ref 0.0–40.0)

## 2020-07-12 MED ORDER — MEDROXYPROGESTERONE ACETATE 10 MG PO TABS: 10.0000 mg | ORAL_TABLET | Freq: Two times a day (BID) | ORAL | 0 refills | Status: DC

## 2020-07-12 MED ORDER — PROPRANOLOL HCL 40 MG PO TABS: 40.0000 mg | ORAL_TABLET | Freq: Two times a day (BID) | ORAL | 0 refills | Status: DC

## 2020-07-12 MED ORDER — GLIPIZIDE 10 MG PO TABS: 10.0000 mg | ORAL_TABLET | Freq: Every day | ORAL | 3 refills | Status: DC

## 2020-07-12 NOTE — Assessment & Plan Note (Signed)
Labs today. Cont lovastatin 40 mg

## 2020-07-12 NOTE — Assessment & Plan Note (Signed)
BP slightly elevated likely 2/2 to stress. Cont hctz 25 mg. Propranolol for tremor

## 2020-07-12 NOTE — Assessment & Plan Note (Signed)
Husband recently died. Less stress overall. Doing OK off medications. F/u as needed. Cont rare prn xanax

## 2020-07-12 NOTE — Assessment & Plan Note (Signed)
Labs today. Cont actos 30 mg and glipizide 5 mg

## 2020-07-12 NOTE — Assessment & Plan Note (Signed)
Etiology unclear. Discussed overall reassuring exam but referral to neurology to discuss further evaluation and work-up if needed.

## 2020-07-12 NOTE — Assessment & Plan Note (Signed)
Refill provera until she is able to meet with new GYN

## 2020-07-12 NOTE — Progress Notes (Signed)
Subjective:     Valerie Lamb is a 65 y.o. female presenting for Numbness (L pinky finger, constantly numb, and runs up arm slightly ) and Tremors (In both hands, when holding items or trying to type )     HPI  #Numbness - left pinky finger - constantly numb - no pain  - just tingling - travels up the wrist and occasionally up the arm - for a few months  - nothing that seems to make it better or worse - not worse in morning or night - does not rest the elbow  #Tremor - when she is trying to use her hands - with pen, knife, moving the mouse on computer - will have to stabilize - does not happen at rest - no family hx of tremor - no difficulty walking  - seems to have started since she started thyroid medication    Review of Systems  06/25/2020: Virtual visit - Bursts of anger - husband to hospice care - stop wellbutrin. Consider SSRI. Xanax prn. Tobacco - wellbutrin ineffective  Social History   Tobacco Use  Smoking Status Current Every Day Smoker  . Packs/day: 0.25  . Years: 46.00  . Pack years: 11.50  Smokeless Tobacco Never Used  Tobacco Comment   2 ppd previously, cut back 2021        Objective:    BP Readings from Last 3 Encounters:  07/12/20 (!) 142/82  05/07/20 130/88   Wt Readings from Last 3 Encounters:  07/12/20 262 lb 8 oz (119.1 kg)  05/07/20 270 lb (122.5 kg)    BP (!) 142/82   Pulse 96   Temp 97.9 F (36.6 C) (Temporal)   Ht 5\' 7"  (1.702 m)   Wt 262 lb 8 oz (119.1 kg)   SpO2 95%   BMI 41.11 kg/m    Physical Exam Constitutional:      General: She is not in acute distress.    Appearance: She is well-developed. She is not diaphoretic.  HENT:     Right Ear: External ear normal.     Left Ear: External ear normal.     Nose: Nose normal.  Eyes:     Conjunctiva/sclera: Conjunctivae normal.  Cardiovascular:     Rate and Rhythm: Normal rate.  Pulmonary:     Effort: Pulmonary effort is normal.  Musculoskeletal:     Cervical  back: Neck supple.     Comments: Left arm: Inspection: no rashes, erythema at the hand. Cyst on the forearm Palpation: no ttp along the hand or elbow ROM: normal finger, wrist, elbow  Strength: normal finger, wrist, elbow Tinel at elbow - negative   Skin:    General: Skin is warm and dry.     Capillary Refill: Capillary refill takes less than 2 seconds.  Neurological:     Mental Status: She is alert. Mental status is at baseline.     Comments: Normal finger to nose. No cogwheel signs. No resting tremor. Some tremor with hand and finger extended.   Psychiatric:        Mood and Affect: Mood normal.        Behavior: Behavior normal.           Assessment & Plan:   Problem List Items Addressed This Visit      Cardiovascular and Mediastinum   Hypertension associated with diabetes ([place])    BP slightly elevated likely 2/2 to stress. Cont hctz 25 mg. Propranolol for tremor  Relevant Medications   lovastatin (MEVACOR) 40 MG tablet   propranolol (INDERAL) 40 MG tablet   Other Relevant Orders   Comprehensive metabolic panel     Endocrine   Type 2 diabetes mellitus with other specified complication ([place])    Labs today. Cont actos 30 mg and glipizide 5 mg       Relevant Medications   lovastatin (MEVACOR) 40 MG tablet   Other Relevant Orders   Hemoglobin A1c   Hyperlipidemia associated with type 2 diabetes mellitus ([place])    Labs today. Cont lovastatin 40 mg      Relevant Medications   lovastatin (MEVACOR) 40 MG tablet   Other Relevant Orders   Lipid panel     Genitourinary   Abnormal uterine bleeding    Refill provera until she is able to meet with new GYN      Relevant Medications   medroxyPROGESTERone (PROVERA) 10 MG tablet     Other   Current moderate episode of major depressive disorder without prior episode [facility])    Husband recently died. Less stress overall. Doing OK off medications. F/u as needed. Cont rare prn xanax      Numbness of finger     Etiology unclear. Discussed overall reassuring exam but referral to neurology to discuss further evaluation and work-up if needed.       Relevant Orders   Ambulatory referral to Neurology   Tremor - Primary    Action tremor. No other worrisome features. May be related to levothyroxine. Her last thyroid was normal. Discussed trial of propranolol 40 mg bid and referral to neurology if symptoms do not respond.       Relevant Medications   propranolol (INDERAL) 40 MG tablet   Other Relevant Orders   Ambulatory referral to Neurology       Return in about 3 months (around 10/09/2020).  Lesleigh Noe, MD  This visit occurred during the SARS-CoV-2 public health emergency.  Safety protocols were in place, including screening questions prior to the visit, additional usage of staff PPE, and extensive cleaning of exam room while observing appropriate contact time as indicated for disinfecting solutions.

## 2020-07-12 NOTE — Assessment & Plan Note (Signed)
Action tremor. No other worrisome features. May be related to levothyroxine. Her last thyroid was normal. Discussed trial of propranolol 40 mg bid and referral to neurology if symptoms do not respond.

## 2020-07-12 NOTE — Patient Instructions (Addendum)
Tremor -- try propranolol 40 mg twice daily -- if lightheaded, take 1/2 tablet, twice daily  #Referral I have placed a referral to a specialist for you. You should receive a phone call from the specialty office. Make sure your voicemail is not full and that if you are able to answer your phone to unknown or new numbers.   It may take up to 2 weeks to hear about the referral. If you do not hear anything in 2 weeks, please call our office and ask to speak with the referral coordinator.

## 2020-07-13 ENCOUNTER — Encounter: Payer: Self-pay | Admitting: Family Medicine

## 2020-07-13 DIAGNOSIS — E1169 Type 2 diabetes mellitus with other specified complication: Secondary | ICD-10-CM

## 2020-07-13 MED ORDER — TRULICITY 0.75 MG/0.5ML ~~LOC~~ SOAJ: 0.7500 mg | SUBCUTANEOUS | 1 refills | Status: DC

## 2020-07-24 ENCOUNTER — Encounter: Payer: Self-pay | Admitting: Obstetrics and Gynecology

## 2020-07-24 ENCOUNTER — Ambulatory Visit (INDEPENDENT_AMBULATORY_CARE_PROVIDER_SITE_OTHER): Payer: Medicare Other | Admitting: Obstetrics and Gynecology

## 2020-07-24 ENCOUNTER — Other Ambulatory Visit: Payer: Self-pay

## 2020-07-24 ENCOUNTER — Other Ambulatory Visit (HOSPITAL_COMMUNITY)
Admission: RE | Admit: 2020-07-24 | Discharge: 2020-07-24 | Disposition: A | Discharge status: Home / Self Care | Payer: Medicare Other | Source: Ambulatory Visit | Attending: Obstetrics and Gynecology | Admitting: Obstetrics and Gynecology

## 2020-07-24 VITALS — BP 130/80 | Ht 67.0 in | Wt 261.6 lb

## 2020-07-24 DIAGNOSIS — Z720 Tobacco use: Secondary | ICD-10-CM

## 2020-07-24 DIAGNOSIS — Z124 Encounter for screening for malignant neoplasm of cervix: Secondary | ICD-10-CM | POA: Diagnosis present

## 2020-07-24 DIAGNOSIS — Z01818 Encounter for other preprocedural examination: Secondary | ICD-10-CM

## 2020-07-24 DIAGNOSIS — R87622 Low grade squamous intraepithelial lesion on cytologic smear of vagina (LGSIL): Secondary | ICD-10-CM | POA: Insufficient documentation

## 2020-07-24 DIAGNOSIS — N898 Other specified noninflammatory disorders of vagina: Secondary | ICD-10-CM

## 2020-07-24 DIAGNOSIS — N8500 Endometrial hyperplasia, unspecified: Secondary | ICD-10-CM

## 2020-07-24 DIAGNOSIS — Z1231 Encounter for screening mammogram for malignant neoplasm of breast: Secondary | ICD-10-CM | POA: Diagnosis not present

## 2020-07-24 DIAGNOSIS — N950 Postmenopausal bleeding: Secondary | ICD-10-CM | POA: Diagnosis not present

## 2020-07-24 DIAGNOSIS — Z1151 Encounter for screening for human papillomavirus (HPV): Secondary | ICD-10-CM | POA: Diagnosis not present

## 2020-07-24 NOTE — Patient Instructions (Addendum)
[ ]   EKG [ ]  Mammogram [ ]  Lung cancer screening [ ]  Surgical clearance [ ]  Hysteroscopy D&C     Postmenopausal Bleeding Postmenopausal bleeding is any bleeding that a woman has after she has entered menopause. Menopause is the end of a woman's fertile years. After menopause, a woman no longer ovulates and does not have menstrual periods. Therefore, she should no longer have bleeding from her vagina. Postmenopausal bleeding may have various causes, including:  Menopausal hormone therapy (MHT).  Endometrial atrophy. After menopause, low estrogen hormone levels cause the membrane that lines the uterus (endometrium) to become thin. You may have bleeding as the endometrium thins.  Endometrial hyperplasia. This condition is caused by excess estrogen hormones and low levels of progesterone hormones. The excess estrogen causes the endometrium to thicken, which can lead to bleeding. In some cases, this can lead to cancer of the uterus.  Endometrial cancer.  Noncancerous growths (polyps) on the endometrium, the lining of the uterus, or the cervix.  Uterine fibroids. These are noncancerous growths in or around the uterus muscle tissue that can cause heavy bleeding. Any type of postmenopausal bleeding, even if it appears to be a typical menstrual period, should be checked by your health care provider. Treatment will depend on the cause of the bleeding. Follow these instructions at home:  Pay attention to any changes in your symptoms. Let your health care provider know about them.  Avoid using tampons and douches as told by your health care provider.  Change your pads regularly.  Get regular pelvic exams, including Pap tests, as told by your health care provider.  Take iron supplements as told by your health care provider.  Take over-the-counter and prescription medicines only as told by your health care provider.  Keep all follow-up visits. This is important.   Contact a health care  provider if:  You have new bleeding from the vagina after menopause.  You have pain in your abdomen. Get help right away if:  You have a fever or chills.  You have severe pain with bleeding.  You are passing blood clots.  You have heavy bleeding, need more than 1 pad an hour, and have never experienced this before.  You have headaches or feel faint or dizzy. Summary  Postmenopausal bleeding is any bleeding that a woman has after she has entered into menopause.  Postmenopausal bleeding may have various causes. Treatment will depend on the cause of the bleeding.  Any type of postmenopausal bleeding, even if it appears to be a typical menstrual period, should be checked by your health care provider.  Be sure to pay attention to any changes in your symptoms and keep all follow-up visits. This information is not intended to replace advice given to you by your health care provider. Make sure you discuss any questions you have with your health care provider. Document Revised: 11/03/2019 Document Reviewed: 11/03/2019 Elsevier Patient Education  [place].

## 2020-07-24 NOTE — H&P (View-Only) (Signed)
Patient ID: Valerie Lamb, female   DOB: 1955-07-17, 65 y.o.   MRN: [id number]  Reason for Consult: Gynecologic Exam   Referred by Lesleigh Noe, MD  Subjective:     HPI:  Valerie Lamb is a 65 y.o. female. She is here today for a secondary consult regarding postmenopausal bleeding.   She reports that has had 3 months of postmenopausal bleeding. The bleeding is heavy. She has had sensations of flooding or gushing of blood. She has had accidents where she bleed through her clothing. She has been having severe pain interfering with her normal activities.   She had a pelvic US which showed a thickened endometrial strip of 11 mm. She was seen by an OBGYN with [facility] who performed an endometrial biopsy. They biopsy showed endometrial hyperplasia without atypia on an endometrial polyp. This OBGYN recommended a hysteroscopy D&C. She was started on Provera and has been taking 10 mg daily. She reports her bleeding has been improving.   Unfortunately the patient's husband passed away 3 weeks ago and the patient has been understandably grieving.  Patient reports that she has had difficulty waking up from surgery in the past.  She reports that she has previously had D&Cs and that she had a difficult time coming out of anesthesia. Patient currently has sleep apnea and uses a CPAP machine nightly.  Gynecological History Menarche: 16 Menopause: 54 Last pap smear: unknown Last Mammogram: 2016 in [place] per patient History of STDs: denies Sexually Active: no  Reports a history of endometriosis.  Denies a history of abnormal pap smears.   Has a family History of breast and endometrial cancer.   Obstetrical History OB History  Gravida Para Term Preterm AB Living  4 4 4     3   SAB IAB Ectopic Multiple Live Births          4    # Outcome Date GA Lbr Len/2nd Weight Sex Delivery Anes PTL Lv  4 Term 09/11/80    F Vag-Spont   LIV  3 Term 05/05/79    M Vag-Spont   LIV  2 Term 10/10/77     Valerie Lamb Vag-Spont   DEC  1 Term 05/19/76    Valerie Lamb   LIV     Past Medical History:  Diagnosis Date  . Anemia   . Anxiety   . COPD (chronic obstructive pulmonary disease) ([place])   . Depression   . Diabetes mellitus without complication ([place])   . Endometriosis   . Hypertension   . Thyroid disease    Family History  Problem Relation Age of Onset  . Alcohol abuse Mother   . Arthritis Mother   . Asthma Mother   . COPD Mother   . Depression Mother   . Drug abuse Mother   . Early death Mother   . Hyperlipidemia Mother   . Hypertension Mother   . Alcohol abuse Father   . Cancer Father    Past Surgical History:  Procedure Laterality Date  . TUBAL LIGATION      Short Social History:  Social History   Tobacco Use  . Smoking status: Current Every Day Smoker    Packs/day: 0.25    Years: 46.00    Pack years: 11.50  . Smokeless tobacco: Never Used  . Tobacco comment: 2 ppd previously, cut back 2021  Substance Use Topics  . Alcohol use: Never    Allergies  Allergen Reactions  . Metformin Nausea And Vomiting  Current Outpatient Medications  Medication Sig Dispense Refill  . albuterol (VENTOLIN HFA) 108 (90 Base) MCG/ACT inhaler Inhale into the lungs.    . ALPRAZolam (XANAX) 0.25 MG tablet Take 1 tablet (0.25 mg total) by mouth daily as needed for anxiety. 15 tablet 0  . Dulaglutide (TRULICITY) 3.81 OF/7.5ZW SOPN Inject 0.75 mg into the skin once a week. 2 mL 1  . gabapentin (NEURONTIN) 300 MG capsule Take 600 mg by mouth at bedtime.    Marland Kitchen glipiZIDE (GLUCOTROL) 10 MG tablet Take 1 tablet (10 mg total) by mouth daily. 90 tablet 3  . hydrochlorothiazide (HYDRODIURIL) 25 MG tablet Take 25 mg by mouth daily.    Marland Kitchen levothyroxine (SYNTHROID) 100 MCG tablet Take 100 mcg by mouth daily.    Marland Kitchen lovastatin (MEVACOR) 40 MG tablet Take 40 mg by mouth daily.    . medroxyPROGESTERone (PROVERA) 10 MG tablet Take 1 tablet (10 mg total) by mouth in the morning and at bedtime. 60 tablet 0  .  omeprazole (PRILOSEC) 20 MG capsule TAKE 2 CAPSULES (40 MG TOTAL) BY MOUTH 2 (TWO) TIMES DAILY BEFORE A MEAL. 120 capsule 0  . pioglitazone (ACTOS) 30 MG tablet Take 30 mg by mouth daily.    . propranolol (INDERAL) 40 MG tablet Take 1 tablet (40 mg total) by mouth 2 (two) times daily. 60 tablet 0  . Tiotropium Bromide Monohydrate 2.5 MCG/ACT AERS Inhale 2 puffs into the lungs daily.      No current facility-administered medications for this visit.    Review of Systems  Constitutional: Negative for chills, fatigue, fever and unexpected weight change.  HENT: Negative for trouble swallowing.  Eyes: Negative for loss of vision.  Respiratory: Negative for cough, shortness of breath and wheezing.  Cardiovascular: Negative for chest pain, leg swelling, palpitations and syncope.  GI: Negative for abdominal pain, blood in stool, diarrhea, nausea and vomiting.  GU: Negative for difficulty urinating, dysuria, frequency and hematuria.  Musculoskeletal: Positive for joint pain. Negative for back pain and leg pain.  Skin: Negative for rash.  Neurological: Positive for numbness. Negative for dizziness, headaches, light-headedness and seizures.  Psychiatric: Positive for depressed mood. Negative for behavioral problem, confusion and sleep disturbance.        Objective:  Objective   Vitals:   07/24/20 0857  BP: 130/80  Weight: 261 lb 9.6 oz (118.7 kg)  Height: 5\' 7"  (1.702 m)   Body mass index is 40.97 kg/m.  Physical Exam Vitals and nursing note reviewed. Exam conducted with a chaperone present.  Constitutional:      Appearance: She is well-developed and well-nourished.  HENT:     Head: Normocephalic and atraumatic.  Eyes:     Extraocular Movements: EOM normal.     Pupils: Pupils are equal, round, and reactive to light.  Cardiovascular:     Rate and Rhythm: Normal rate and regular rhythm.  Pulmonary:     Effort: Pulmonary effort is normal. No respiratory distress.  Chest:  Breasts:      Right: Normal.     Left: Normal.    Genitourinary:    Comments: External: Normal appearing vulva. No lesions noted.  Speculum examination: Normal appearing cervix. No blood in the vaginal vault. No discharge.  Vaginal lesion/polypoid tissue noted above the cervix . Colposcopic biopsy of tissue performed.  Bimanual examination: Uterus midline, non-tender, normal in size, shape and contour.  No CMT. No adnexal masses. No adnexal tenderness. Pelvis not fixed.  Skin:    General: Skin is  warm and dry.  Neurological:     Mental Status: She is alert and oriented to person, place, and time.  Psychiatric:        Mood and Affect: Mood and affect normal.        Behavior: Behavior normal.        Thought Content: Thought content normal.        Judgment: Judgment normal.     Assessment/Plan:    65 yo with postmenopausal bleeding and prior biopsy proven endometrial hyperplasia without atypia.  1. Patient has a family history of endometrial cancer and risk factors for endometrial cancer such as obesity. Her strong preference is for a hysterectomy. We discussed scheduling this procedure. Given the thickness of her endometrium I recommended a D&C prior to surgery to rule out higher grade lesions as part of presurgical planning.  2. Vaginal lesion- colposcopy biopsy today 3. Cervical cancer screening- Pap smear today.   4. Reports difficulty waking up from surgery in the past- will need cardiac and medical clearance. Asked patient o proceed to the hospital for a ECG.  5. History of tobacco use- will refer for low dose CT lung cancer screening.  6. Breast cancer screening- mammogram ordered.   More than 45 minutes were spent face to face with the patient in the room, reviewing the medical record, labs and images, and coordinating care for the patient. The plan of management was discussed in detail and counseling was provided.      Adrian Prows MD [facility] [place]  [facility] 07/24/2020 9:13 AM

## 2020-07-24 NOTE — Progress Notes (Signed)
Patient ID: Valerie Lamb, female   DOB: [birth year], 65 y.o.   MRN: [id number]  Reason for Consult: Gynecologic Exam   Referred by Valerie Noe, MD  Subjective:     HPI:  Valerie Lamb is a 65 y.o. female. She is here today for a secondary consult regarding postmenopausal bleeding.   She reports that has had 3 months of postmenopausal bleeding. The bleeding is heavy. She has had sensations of flooding or gushing of blood. She has had accidents where she bleed through her clothing. She has been having severe pain interfering with her normal activities.   She had a pelvic US which showed a thickened endometrial strip of 11 mm. She was seen by an OBGYN with [facility] Lamb who performed an endometrial biopsy. They biopsy showed endometrial hyperplasia without atypia on an endometrial polyp. This OBGYN recommended a hysteroscopy D&C. She was started on Provera and has been taking 10 mg daily. She reports her bleeding has been improving.   Unfortunately the patient's husband passed away 3 weeks ago and the patient has been understandably grieving.  Patient reports that she has had difficulty waking up from surgery in the past.  She reports that she has previously had D&Cs and that she had a difficult time coming out of anesthesia. Patient currently has sleep apnea and uses a CPAP machine nightly.  Gynecological History Menarche: 16 Menopause: 44 Last pap smear: unknown Last Mammogram: 2016 in [place] per patient History of STDs: denies Sexually Active: no  Reports a history of endometriosis.  Denies a history of abnormal pap smears.   Has a family History of breast and endometrial cancer.   Obstetrical History OB History  Gravida Para Term Preterm AB Living  4 4 4     3   SAB IAB Ectopic Multiple Live Births          4    # Outcome Date GA Lbr Len/2nd Weight Sex Delivery Anes PTL Lv  4 Term 09/11/80    F Vag-Spont   LIV  3 Term 05/05/79    M Vag-Spont   LIV  2 Term 10/10/77     Valerie Lamb Vag-Spont   DEC  1 Term 05/19/76    Valerie Lamb   LIV     Past Medical History:  Diagnosis Date  . Anemia   . Anxiety   . COPD (chronic obstructive pulmonary disease) ([place])   . Depression   . Diabetes mellitus without complication ([place])   . Endometriosis   . Hypertension   . Thyroid disease    Family History  Problem Relation Age of Onset  . Alcohol abuse Mother   . Arthritis Mother   . Asthma Mother   . COPD Mother   . Depression Mother   . Drug abuse Mother   . Early death Mother   . Hyperlipidemia Mother   . Hypertension Mother   . Alcohol abuse Father   . Cancer Father    Past Surgical History:  Procedure Laterality Date  . TUBAL LIGATION      Short Social History:  Social History   Tobacco Use  . Smoking status: Current Every Day Smoker    Packs/day: 0.25    Years: 46.00    Pack years: 11.50  . Smokeless tobacco: Never Used  . Tobacco comment: 2 ppd previously, cut back 2021  Substance Use Topics  . Alcohol use: Never    Allergies  Allergen Reactions  . Metformin Nausea And Vomiting  Current Outpatient Medications  Medication Sig Dispense Refill  . albuterol (VENTOLIN HFA) 108 (90 Base) MCG/ACT inhaler Inhale into the lungs.    . ALPRAZolam (XANAX) 0.25 MG tablet Take 1 tablet (0.25 mg total) by mouth daily as needed for anxiety. 15 tablet 0  . Dulaglutide (TRULICITY) 1.61 WR/6.0AV SOPN Inject 0.75 mg into the skin once a week. 2 mL 1  . gabapentin (NEURONTIN) 300 MG capsule Take 600 mg by mouth at bedtime.    Marland Kitchen glipiZIDE (GLUCOTROL) 10 MG tablet Take 1 tablet (10 mg total) by mouth daily. 90 tablet 3  . hydrochlorothiazide (HYDRODIURIL) 25 MG tablet Take 25 mg by mouth daily.    Marland Kitchen levothyroxine (SYNTHROID) 100 MCG tablet Take 100 mcg by mouth daily.    Marland Kitchen lovastatin (MEVACOR) 40 MG tablet Take 40 mg by mouth daily.    . medroxyPROGESTERone (PROVERA) 10 MG tablet Take 1 tablet (10 mg total) by mouth in the morning and at bedtime. 60 tablet 0  .  omeprazole (PRILOSEC) 20 MG capsule TAKE 2 CAPSULES (40 MG TOTAL) BY MOUTH 2 (TWO) TIMES DAILY BEFORE A MEAL. 120 capsule 0  . pioglitazone (ACTOS) 30 MG tablet Take 30 mg by mouth daily.    . propranolol (INDERAL) 40 MG tablet Take 1 tablet (40 mg total) by mouth 2 (two) times daily. 60 tablet 0  . Tiotropium Bromide Monohydrate 2.5 MCG/ACT AERS Inhale 2 puffs into the lungs daily.      No current facility-administered medications for this visit.    Review of Systems  Constitutional: Negative for chills, fatigue, fever and unexpected weight change.  HENT: Negative for trouble swallowing.  Eyes: Negative for loss of vision.  Respiratory: Negative for cough, shortness of breath and wheezing.  Cardiovascular: Negative for chest pain, leg swelling, palpitations and syncope.  GI: Negative for abdominal pain, blood in stool, diarrhea, nausea and vomiting.  GU: Negative for difficulty urinating, dysuria, frequency and hematuria.  Musculoskeletal: Positive for joint pain. Negative for back pain and leg pain.  Skin: Negative for rash.  Neurological: Positive for numbness. Negative for dizziness, headaches, light-headedness and seizures.  Psychiatric: Positive for depressed mood. Negative for behavioral problem, confusion and sleep disturbance.        Objective:  Objective   Vitals:   07/24/20 0857  BP: 130/80  Weight: 261 lb 9.6 oz (118.7 kg)  Height: 5\' 7"  (1.702 m)   Body mass index is 40.97 kg/m.  Physical Exam Vitals and nursing note reviewed. Exam conducted with a chaperone present.  Constitutional:      Appearance: She is well-developed and well-nourished.  HENT:     Head: Normocephalic and atraumatic.  Eyes:     Extraocular Movements: EOM normal.     Pupils: Pupils are equal, round, and reactive to light.  Cardiovascular:     Rate and Rhythm: Normal rate and regular rhythm.  Pulmonary:     Effort: Pulmonary effort is normal. No respiratory distress.  Chest:  Breasts:      Right: Normal.     Left: Normal.    Genitourinary:    Comments: External: Normal appearing vulva. No lesions noted.  Speculum examination: Normal appearing cervix. No blood in the vaginal vault. No discharge.  Vaginal lesion/polypoid tissue noted above the cervix . Colposcopic biopsy of tissue performed.  Bimanual examination: Uterus midline, non-tender, normal in size, shape and contour.  No CMT. No adnexal masses. No adnexal tenderness. Pelvis not fixed.  Skin:    General: Skin is  warm and dry.  Neurological:     Mental Status: She is alert and oriented to person, place, and time.  Psychiatric:        Mood and Affect: Mood and affect normal.        Behavior: Behavior normal.        Thought Content: Thought content normal.        Judgment: Judgment normal.     Assessment/Plan:    65 yo with postmenopausal bleeding and prior biopsy proven endometrial hyperplasia without atypia.  1. Patient has a family history of endometrial cancer and risk factors for endometrial cancer such as obesity. Her strong preference is for a hysterectomy. We discussed scheduling this procedure. Given the thickness of her endometrium I recommended a D&C prior to surgery to rule out higher grade lesions as part of presurgical planning.  2. Vaginal lesion- colposcopy biopsy today 3. Cervical cancer screening- Pap smear today.   4. Reports difficulty waking up from surgery in the past- will need cardiac and medical clearance. Asked patient o proceed to the hospital for a ECG.  5. History of tobacco use- will refer for low dose CT lung cancer screening.  6. Breast cancer screening- mammogram ordered.   More than 45 minutes were spent face to face with the patient in the room, reviewing the medical record, labs and images, and coordinating care for the patient. The plan of management was discussed in detail and counseling was provided.      Adrian Prows MD [facility] [place]  [facility] 07/24/2020 9:13 AM

## 2020-07-25 ENCOUNTER — Encounter: Payer: Self-pay | Admitting: Obstetrics and Gynecology

## 2020-07-25 LAB — SURGICAL PATHOLOGY

## 2020-07-26 LAB — CYTOLOGY - PAP
Comment: NEGATIVE
Diagnosis: UNDETERMINED — AB
High risk HPV: NEGATIVE

## 2020-07-29 ENCOUNTER — Encounter: Payer: Self-pay | Admitting: Family Medicine

## 2020-07-30 ENCOUNTER — Ambulatory Visit: Payer: Medicare Other | Admitting: Family Medicine

## 2020-07-31 ENCOUNTER — Telehealth: Payer: Self-pay | Admitting: *Deleted

## 2020-07-31 DIAGNOSIS — Z87891 Personal history of nicotine dependence: Secondary | ICD-10-CM

## 2020-07-31 DIAGNOSIS — F17200 Nicotine dependence, unspecified, uncomplicated: Secondary | ICD-10-CM

## 2020-07-31 DIAGNOSIS — Z122 Encounter for screening for malignant neoplasm of respiratory organs: Secondary | ICD-10-CM

## 2020-07-31 NOTE — Telephone Encounter (Signed)
Received referral for initial lung cancer screening scan. Contacted patient and obtained smoking history,(current, 46 pack year) as well as answering questions related to screening process. Patient denies signs of lung cancer such as weight loss or hemoptysis. Patient denies comorbidity that would prevent curative treatment if lung cancer were found. Patient is scheduled for shared decision making visit and CT scan on 08/15/20 at 1030am.

## 2020-08-03 ENCOUNTER — Telehealth: Payer: Self-pay

## 2020-08-03 NOTE — Telephone Encounter (Signed)
Patient returned my call to schedule Hysteroscopy D&C, vaginal biopsy w [facility]  DOS 3/24  H&P 3/17 @ 3:50   Covid testing 3/22 @ 8-10:30, [address], drive up and wear mask. Advised pt to quarantine until DOS.  Pre-admit phone call appointment to be requested - date and time will be included on H&P paper work. Also all appointments will be updated on pt MyChart. Explained that this appointment has a call window. Based on the time scheduled will indicate if the call will be received within a 4 hour window before 1:00 or after.  Advised that pt may also receive calls from the hospital pharmacy and pre-service center.  Confirmed pt has [facility] Medicare as primary insurance. No secondary insurance.  **DOS would have pt case starting later in the day. She adv that she is a diabetic and is concerned about NPO for an extended amt of time. I adv that I will ask about this and let her know.

## 2020-08-03 NOTE — Telephone Encounter (Signed)
-----   Message from Homero Fellers, MD sent at 07/25/2020  5:05 PM EST ----- Regarding: surgery Surgery Booking Request Patient Full Name:  Valerie Lamb  MRN: [id number]  DOB: 03/16/1956  Surgeon: Homero Fellers, MD  Requested Surgery Date and Time: In the next month Primary Diagnosis AND Code: VAIN and Endometrial hyperplasia without atypia  Secondary Diagnosis and Code:  Surgical Procedure: Hysteroscopy D&C, vaginal biopsy RNFA Requested?: No L&D Notification: No Admission Status: same day surgery Length of Surgery: 50 min Special Case Needs: No H&P: Yes Phone Interview???:  No Interpreter: No Medical Clearance:  Yes and cardiac clearance  Special Scheduling Instructions: No Any known health/anesthesia issues, diabetes, sleep apnea, latex allergy, defibrillator/pacemaker?: Yes History of sleep apnea and "difficulty waking up" after last minor surgery.   Acuity: P2   (P1 highest, P2 delay may cause harm, P3 low, elective gyn, P4 lowest)

## 2020-08-03 NOTE — Telephone Encounter (Signed)
Called pt and left VM to rtn call

## 2020-08-04 ENCOUNTER — Other Ambulatory Visit: Payer: Self-pay | Admitting: Family Medicine

## 2020-08-04 DIAGNOSIS — R251 Tremor, unspecified: Secondary | ICD-10-CM

## 2020-08-04 DIAGNOSIS — N939 Abnormal uterine and vaginal bleeding, unspecified: Secondary | ICD-10-CM

## 2020-08-07 ENCOUNTER — Telehealth: Payer: Self-pay

## 2020-08-07 NOTE — Telephone Encounter (Signed)
Pt calling; needs to speak to someone about all these appt sched this month; has questions and concerns and needs answers before she can make any of these appts.  [phone]

## 2020-08-07 NOTE — Telephone Encounter (Signed)
She plans to refuse covid testing before suregery.  She wants her H&P to be a phone visit.  She plans to not have lung cancer screening CT.

## 2020-08-07 NOTE — Telephone Encounter (Signed)
rec'd Josem Kaufmann - [phone]

## 2020-08-08 ENCOUNTER — Telehealth: Payer: Self-pay

## 2020-08-08 NOTE — Telephone Encounter (Signed)
Noted. Would recommend appointment for surgical clearance. Can plan to also address optimization for possible hysterectomy

## 2020-08-08 NOTE — Telephone Encounter (Signed)
I spoke to [facility] at [facility] in regards to pt's medical clearance for upcoming procedure. Dr. Einar Pheasant wanted to know if they had a threshold for the pt's A1C considering it was at 10 last month. Kellee spoke to Dr. Gilman Schmidt and she says that an A1C of 10 is fine for this procedure (hysteroscopy D/C and biopsy) but she will want it down to an 8 if the pt ends up having a hysterectomy, which she thinks is very likely in the near future.  Please advise if an appt is needed to fill out medical clearance paperwork.

## 2020-08-08 NOTE — Telephone Encounter (Signed)
Please call and schedule pt for surgical clearance. Thank you

## 2020-08-09 NOTE — Telephone Encounter (Signed)
Called patient and scheduled for 3/14.

## 2020-08-10 ENCOUNTER — Encounter: Payer: Self-pay | Admitting: Family Medicine

## 2020-08-13 ENCOUNTER — Ambulatory Visit (INDEPENDENT_AMBULATORY_CARE_PROVIDER_SITE_OTHER): Payer: Medicare Other | Admitting: Family Medicine

## 2020-08-13 ENCOUNTER — Ambulatory Visit (INDEPENDENT_AMBULATORY_CARE_PROVIDER_SITE_OTHER)
Admission: RE | Admit: 2020-08-13 | Discharge: 2020-08-13 | Disposition: A | Discharge status: Home / Self Care | Payer: Medicare Other | Source: Ambulatory Visit | Attending: Family Medicine | Admitting: Family Medicine

## 2020-08-13 ENCOUNTER — Encounter: Payer: Self-pay | Admitting: Family Medicine

## 2020-08-13 ENCOUNTER — Other Ambulatory Visit: Payer: Self-pay

## 2020-08-13 VITALS — BP 110/70 | HR 66 | Temp 97.8°F | Wt 258.5 lb

## 2020-08-13 DIAGNOSIS — I152 Hypertension secondary to endocrine disorders: Secondary | ICD-10-CM | POA: Diagnosis not present

## 2020-08-13 DIAGNOSIS — Z720 Tobacco use: Secondary | ICD-10-CM | POA: Diagnosis not present

## 2020-08-13 DIAGNOSIS — J449 Chronic obstructive pulmonary disease, unspecified: Secondary | ICD-10-CM

## 2020-08-13 DIAGNOSIS — E1159 Type 2 diabetes mellitus with other circulatory complications: Secondary | ICD-10-CM

## 2020-08-13 DIAGNOSIS — F321 Major depressive disorder, single episode, moderate: Secondary | ICD-10-CM

## 2020-08-13 DIAGNOSIS — Z01818 Encounter for other preprocedural examination: Secondary | ICD-10-CM | POA: Insufficient documentation

## 2020-08-13 DIAGNOSIS — E1169 Type 2 diabetes mellitus with other specified complication: Secondary | ICD-10-CM

## 2020-08-13 DIAGNOSIS — G4700 Insomnia, unspecified: Secondary | ICD-10-CM

## 2020-08-13 MED ORDER — TRULICITY 1.5 MG/0.5ML ~~LOC~~ SOAJ: 1.5000 mg | SUBCUTANEOUS | 1 refills | Status: DC

## 2020-08-13 MED ORDER — ALPRAZOLAM 0.25 MG PO TABS: 0.2500 mg | ORAL_TABLET | Freq: Every day | ORAL | 0 refills | Status: DC | PRN

## 2020-08-13 NOTE — Assessment & Plan Note (Signed)
EKG normal. Labs with poorly controlled DM and CKD stage 3. BP normal today. COPD which is well controlled. Discussed optimized from a cardiac standpoint for D&C and biopsy. Discussed with patient if needing more extensive surgery would recommend improved DM control and tobacco cessation (already working on quitting).

## 2020-08-13 NOTE — Assessment & Plan Note (Signed)
Poorly controlled and elevated at home CBG 200s. Cont actos 30 mg and glipizide. Increase trulicity 2.20>2.5. Mychart in 3 weeks with CBGs and anticipating increasing trulicity to 3 mg weekly. Discussed if anticipating more involved surgery in the future will need to consider insulin for glucose control on the short term.

## 2020-08-13 NOTE — Assessment & Plan Note (Signed)
Working on cutting back. Down to 1 pack per week. From 2 ppd originally. OK for D&C, however, discussed would ideally recommend complete smoking cessation 1-2 months prior to major surgery if indicated. Pt is motivated to quit.

## 2020-08-13 NOTE — Assessment & Plan Note (Signed)
Grieving the loss of her husband. Refill xanax for short term anxiety.

## 2020-08-13 NOTE — Telephone Encounter (Signed)
Adjust to phone visit for Friday.

## 2020-08-13 NOTE — Patient Instructions (Signed)
#  continue to quit smoking  #Diabetes - increase Trulicity to 1.5 mg weekly - Mychart in 3 weeks - with your glucose monitoring - Fasting (morning) goal <140 - 2 hours after a meal goal <180  Chest X-ray today

## 2020-08-13 NOTE — Progress Notes (Addendum)
Subjective:    Valerie Lamb is a 65 y.o. female who presents to the office today for a preoperative consultation at the request of surgeon Dr. Gilman Schmidt who plans on performing hysteroscopy and vaginal biopsy on March 24.   This consultation is requested for the specific conditions prompting preoperative evaluation (i.e. because of potential affect on operative risk): Diabetes, HTN, COPD, tobacco use. Planned anesthesia is unknown. The patient has the following known anesthesia issues: has had low Oxygen following anesthesia. .    Patient has a bleeding risk of: uterine bleeding - getting procedure for this issue. Patient does not have objections to receiving blood products if needed.  Respiratory Risk Factors:  Denies: cough, sputum, chronic bronchitis or shortness with long walking - baseline per patient Social History   Tobacco Use  . Smoking status: Current Every Day Smoker    Packs/day: 1.00    Years: 46.00    Pack years: 46.00    Start date: 56  . Smokeless tobacco: Never Used  . Tobacco comment: 2 ppd previously, cut back 2021  Substance Use Topics  . Alcohol use: Never   Tobacco use - down to 1 pack per week - working very hard to cut this back  Diabetes - due for refill  - has had difficulty getting this due to stock issues - has been checking cbg at home - ~200 for fasting - actos 30 mg, and glipizide 10 mg   #COPD - was never on anything - does not need the albuterol but keeps this with her - no wheezing   If symptoms present: Consider pulmonary function testing or peak flow, CXR, ECG (>40 yo), hemoglobin, glucose (>45 yo)  Cardiac Risk Factors Age >40, other risks If presents - should obtain ECG. Further tests indicated if ECG with abnormalities   METs - Is able to complete exercise of 4 or more METs No > 4 METs: climbing 1 flight of stairs, mowing the lawn (walking), gardening, golfing w/o a cart, doubles tennis, swimming, riding a bike, square  dancing, jogging   Patient is having a Minimal risk risk surgery.  High Risk surgery: emergency surgery, anticipated increased blood loss, aortic or peripheral vascular surgery Intermediate Risk: Abdominal/thoracic, head and neck, carotid endarterectomy, orthopedic surgery, prostate surgery Low Risk: Breast surgery, cataract surgery, superficial surgery, endoscopy   The following portions of the patient's history were reviewed and updated as appropriate: allergies, current medications, past family history, past medical history, past social history, past surgical history and problem list.  Review of Systems Review of Systems  Constitutional: Negative for chills and fever.  HENT: Negative.   Eyes: Negative.   Respiratory: Positive for cough, sputum production and shortness of breath. Negative for wheezing.   Cardiovascular: Negative for chest pain and palpitations.  Gastrointestinal: Negative.   Genitourinary: Negative.   Musculoskeletal: Negative.   Skin: Negative.   Neurological: Negative for dizziness and headaches.  Endo/Heme/Allergies: Negative.   Psychiatric/Behavioral: Positive for depression.      Objective:      Physical Exam BP Readings from Last 3 Encounters:  08/13/20 110/70  07/24/20 130/80  07/12/20 (!) 142/82   Wt Readings from Last 3 Encounters:  08/13/20 258 lb 8 oz (117.3 kg)  07/24/20 261 lb 9.6 oz (118.7 kg)  07/12/20 262 lb 8 oz (119.1 kg)    Physical Exam Constitutional:      General: She is not in acute distress.    Appearance: She is well-developed. She is not diaphoretic.  HENT:     Right Ear: External ear normal.     Left Ear: External ear normal.  Eyes:     Conjunctiva/sclera: Conjunctivae normal.  Cardiovascular:     Rate and Rhythm: Normal rate and regular rhythm.     Heart sounds: No murmur heard.   Pulmonary:     Effort: Pulmonary effort is normal. No respiratory distress.     Breath sounds: Normal breath sounds. No wheezing.   Musculoskeletal:     Cervical back: Neck supple.  Skin:    General: Skin is warm and dry.     Capillary Refill: Capillary refill takes less than 2 seconds.  Neurological:     Mental Status: She is alert. Mental status is at baseline.  Psychiatric:        Mood and Affect: Mood normal.        Behavior: Behavior normal.      Cardiographics ECG: normal sinus rhythm, no blocks or conduction defects, no ischemic changes if >40 yo Echocardiogram: not done  Imaging Chest x-ray: no effusion, some opacities will f/u final read   Lab Review   Lab Results  Component Value Date   HGBA1C 10.2 (H) 07/12/2020   Lab Results  Component Value Date   CHOL 173 07/12/2020   HDL 41.90 07/12/2020   LDLCALC 103 (H) 07/12/2020   TRIG 138.0 07/12/2020   CHOLHDL 4 07/12/2020     Chemistry      Component Value Date/Time   NA 137 07/12/2020 1240   K 3.8 07/12/2020 1240   CL 98 07/12/2020 1240   CO2 31 07/12/2020 1240   BUN 11 07/12/2020 1240   CREATININE 1.01 07/12/2020 1240      Component Value Date/Time   CALCIUM 10.2 07/12/2020 1240   ALKPHOS 73 07/12/2020 1240   AST 27 07/12/2020 1240   ALT 27 07/12/2020 1240   BILITOT 0.8 07/12/2020 1240            Assessment:    64 y.o. female with planned surgery as above.   Known risk factors for perioperative complications: Chronic pulmonary disease Diabetes mellitus Renal dysfunction tobacco use   Difficulty with intubation is not anticipated.  Cardiac Risk Estimation: per the Revised Cardiac Risk Index (Circ. 100:1043, 1999), the patient's risk factors for cardiac complications include poorly controlled diabetes and tobacco use, putting her in: RCI RISK CLASS I (0 risk factors, risk of major cardiac compl. appr. 0.5%)  Major Clinical Predictors: Present ? No, if Yes ->obtain cardiology consult MI <6 weeks ago, unstable angina, decompensated CHF, significant arrhythmia causing hemodynamic instability, severe valvular disease   Intermediate Clinical Predictors: Present ? No, if Yes -> cardiology if METS <4 or high risk procedure, OK for surgery if METs>4 AND low or intermediate risk Mild angina pectoris, MI >6 weeks ago, compensated CHF, DM Minor clinical Predictors: Present ? No, if Yes -> cardiology if METS <4 AND high risk procedure, OK for surgery if METs>4 OR low or intermediate risk Advanced age, abnormal ECG, cardiac rhythm other than sinus, local functional capacity, hx of stroke, uncontrolled HTN  Current medications which may produce withdrawal symptoms if withheld perioperatively: none.      Plan:   Problem List Items Addressed This Visit      Cardiovascular and Mediastinum   Hypertension associated with diabetes ([place])    BP well controlled. Cont hctz 25 mg.       Relevant Medications   Dulaglutide (TRULICITY) 1.5 GL/8.7FI SOPN  Respiratory   COPD (chronic obstructive pulmonary disease) (H[facility])    Pt reports breathing stable. Lungs CTAB on exam. Cont spiriva and prn albuterol. As upcoming surgery and hx of difficulty coming out of anesthesia will get CXR. Pt declined lung cancer screening as not interested in treatment at this time.       Relevant Orders   DG Chest 2 View     Endocrine   Type 2 diabetes mellitus with diabetic chronic kidney disease ([place])    Poorly controlled and elevated at home CBG 200s. Cont actos 30 mg and glipizide. Increase trulicity 6.28>3.1. Mychart in 3 weeks with CBGs and anticipating increasing trulicity to 3 mg weekly. Discussed if anticipating more involved surgery in the future will need to consider insulin for glucose control on the short term.       Relevant Medications   Dulaglutide (TRULICITY) 1.5 DV/7.6HY SOPN     Other   Current moderate episode of major depressive disorder without prior episode (HCC)    Grieving the loss of her husband. Refill xanax for short term anxiety.       Relevant Medications   ALPRAZolam (XANAX) 0.25 MG tablet   Tobacco  abuse    Working on cutting back. Down to 1 pack per week. From 2 ppd originally. OK for D&C, however, discussed would ideally recommend complete smoking cessation 1-2 months prior to major surgery if indicated. Pt is motivated to quit.       Relevant Orders   DG Chest 2 View   Insomnia   Relevant Medications   ALPRAZolam (XANAX) 0.25 MG tablet   Pre-operative clearance - Primary    EKG normal. Labs with poorly controlled DM and CKD stage 3. BP normal today. COPD which is well controlled. Discussed optimized from a cardiac standpoint for D&C and biopsy. Discussed with patient if needing more extensive surgery would recommend improved DM control and tobacco cessation (already working on quitting).       Relevant Orders   EKG 12-Lead (Completed)       1. Preoperative workup as follows ECG, CXR - labs done in February . 2. Change in medication regimen before surgery: none, ok to hold medication if needed or can take with sips of water. . 3. Prophylaxis for cardiac events with perioperative beta-blockers: not indicated. 4. Invasive hemodynamic monitoring perioperatively: not indicated. 5. Deep vein thrombosis prophylaxis postoperatively:regimen to be chosen by surgical team. 6. Surveillance for postoperative MI with ECG immediately postoperatively and on postoperative days 1 and 2 AND troponin levels 24 hours postoperatively and on day 4 or hospital discharge (whichever comes first): not indicated. 7. Other measures: See individual plan.   Overall for this planned D&C and vaginal biopsy pt is optimized for surgery.   Discussed with patient the recommendations for diabetes and tobacco specifically if planning future hysterectomy. See individual plan.   Optimized pending final CXR read  Addendum: CXR normal. Optimized for biopsy  Lesleigh Noe, MD

## 2020-08-13 NOTE — Assessment & Plan Note (Signed)
BP well controlled. Cont hctz 25 mg.

## 2020-08-13 NOTE — Assessment & Plan Note (Signed)
Pt reports breathing stable. Lungs CTAB on exam. Cont spiriva and prn albuterol. As upcoming surgery and hx of difficulty coming out of anesthesia will get CXR. Pt declined lung cancer screening as not interested in treatment at this time.

## 2020-08-15 ENCOUNTER — Telehealth: Payer: Medicare Other | Admitting: Oncology

## 2020-08-15 ENCOUNTER — Ambulatory Visit: Payer: Medicare Other

## 2020-08-16 ENCOUNTER — Encounter: Payer: Self-pay | Admitting: Family Medicine

## 2020-08-16 ENCOUNTER — Other Ambulatory Visit: Payer: Self-pay

## 2020-08-16 ENCOUNTER — Ambulatory Visit (INDEPENDENT_AMBULATORY_CARE_PROVIDER_SITE_OTHER): Payer: Medicare Other | Admitting: Obstetrics and Gynecology

## 2020-08-16 ENCOUNTER — Encounter: Payer: Self-pay | Admitting: Obstetrics and Gynecology

## 2020-08-16 VITALS — Wt 258.0 lb

## 2020-08-16 DIAGNOSIS — N8500 Endometrial hyperplasia, unspecified: Secondary | ICD-10-CM | POA: Diagnosis not present

## 2020-08-16 DIAGNOSIS — N950 Postmenopausal bleeding: Secondary | ICD-10-CM | POA: Diagnosis not present

## 2020-08-16 DIAGNOSIS — N893 Dysplasia of vagina, unspecified: Secondary | ICD-10-CM | POA: Diagnosis not present

## 2020-08-16 NOTE — Progress Notes (Signed)
Virtual Visit via Telephone Note  I connected with Valerie Lamb on 08/16/20 at  3:50 PM EDT by telephone and verified that I am speaking with the correct person using two identifiers.   I discussed the limitations, risks, security and privacy concerns of performing an evaluation and management service by telephone and the availability of in person appointments. I also discussed with the patient that there may be a patient responsible charge related to this service. The patient expressed understanding and agreed to proceed.  The patient was at home I spoke with the patient from my  office The names of people involved in this encounter were: Dr. Gilman Schmidt and Antony Odea.  History of Present Illness: She is feeling well. No complaints or questiosn   Observations/Objective:   Physical Exam could not be performed. Because of the COVID-19 outbreak this visit was performed over the phone and not in person.   Assessment and Plan: 65 yo with postmenopausal bleeding, endometrial hyperplasia and VAIN, plan for hysteroscopy D&C with destruction of VAIN lesion  I have had a careful discussion with this patient about all the options available and the risk/benefits of each. I  She understands that most patients have surgery with little difficulty, but problems can happen ranging from minor to fatal. These include nausea, vomiting, pain, bleeding, infection, poor healing, Unexpected reactions may occur from any drug or anesthetic given. Unintended injury may occur to other pelvic or abdominal structures such as Fallopian tubes, ovaries, bladder, ureter (tube from kidney to bladder), or bowel. Nerves going from the pelvis to the legs may be injured. Any such injury may require immediate or later additional surgery to correct the problem. Excessive blood loss requiring transfusion is very unlikely but possible. Dangerous blood clots may form in the legs or lungs. Physical and sexual activity will be restricted  in varying degrees for an indeterminate period of time but most often 2-4 weeks. Finally, she understands that it is impossible to list every possible undesirable effect and that the condition for which surgery is done is not always cured or significantly improved, and in rare cases may be even worse.   Follow Up Instructions: Follow up for surgery next week.   I discussed the assessment and treatment plan with the patient. The patient was provided an opportunity to ask questions and all were answered. The patient agreed with the plan and demonstrated an understanding of the instructions.   The patient was advised to call back or seek an in-person evaluation if the symptoms worsen or if the condition fails to improve as anticipated.  I provided 10 minutes of non-face-to-face time during this encounter.  Adrian Prows MD [facility], [facility] 08/16/2020 4:54 PM

## 2020-08-17 ENCOUNTER — Encounter
Admission: RE | Admit: 2020-08-17 | Discharge: 2020-08-17 | Disposition: A | Discharge status: Home / Self Care | Payer: Medicare Other | Source: Ambulatory Visit | Attending: Diagnostic Radiology | Admitting: Diagnostic Radiology

## 2020-08-17 ENCOUNTER — Inpatient Hospital Stay: Admission: RE | Admit: 2020-08-17 | Payer: Medicare Other | Source: Ambulatory Visit

## 2020-08-17 ENCOUNTER — Telehealth: Payer: Self-pay

## 2020-08-17 HISTORY — DX: Gastric ulcer, unspecified as acute or chronic, without hemorrhage or perforation: K25.9

## 2020-08-17 HISTORY — DX: Sleep apnea, unspecified: G47.30

## 2020-08-17 NOTE — Telephone Encounter (Signed)
Pt called to inform that PAT told her that they are no longer doing rapid Covid tests. I will call PAT to discuss on Monday.

## 2020-08-17 NOTE — Patient Instructions (Signed)
Your procedure is scheduled on: 07/26/20 Report to [place]. To find out your arrival time please call [phone] between 1PM - 3PM on 07/25/20.  Remember: Instructions that are not followed completely may result in serious medical risk, up to and including death, or upon the discretion of your surgeon and anesthesiologist your surgery may need to be rescheduled.     _X__ 1. Do not eat food after midnight the night before your procedure.                 No gum chewing or hard candies. You may drink clear liquids up to 2 hours                 before you are scheduled to arrive for your surgery- DO not drink clear                 liquids within 2 hours of the start of your surgery.                 Clear Liquids include:  water, apple juice without pulp, clear carbohydrate                 drink such as Clearfast or Gatorade, Black Coffee or Tea (Do not add                 anything to coffee or tea). Diabetics water only  __X__2.  On the morning of surgery brush your teeth with toothpaste and water, you                 may rinse your mouth with mouthwash if you wish.  Do not swallow any              toothpaste of mouthwash.     _X__ 3.  No Alcohol for 24 hours before or after surgery.   _X__ 4.  Do Not Smoke or use e-cigarettes For 24 Hours Prior to Your Surgery.                 Do not use any chewable tobacco products for at least 6 hours prior to                 surgery.  ____  5.  Bring all medications with you on the day of surgery if instructed.   __X__  6.  Notify your doctor if there is any change in your medical condition      (cold, fever, infections).     Do not wear jewelry, make-up, hairpins, clips or nail polish. Do not wear lotions, powders, or perfumes.  Do not shave 48 hours prior to surgery. Men may shave face and neck. Do not bring valuables to the hospital.    [facility] is not responsible for any belongings or  valuables.  Contacts, dentures/partials or body piercings may not be worn into surgery. Bring a case for your contacts, glasses or hearing aids, a denture cup will be supplied. Leave your suitcase in the car. After surgery it may be brought to your room. For patients admitted to the hospital, discharge time is determined by your treatment team.   Patients discharged the day of surgery will not be allowed to drive home.   Please read over the following fact sheets that you were given:    __X__ Take these medicines the morning of surgery with A SIP OF WATER:    1.  levothyroxine (SYNTHROID) 100 MCG tablet  2. omeprazole (PRILOSEC) 20 MG capsule  3. propranolol (INDERAL) 40 MG tablet  4. ALPRAZolam (XANAX) 0.25 MG tablet if needed  5.  6.  ____ Fleet Enema (as directed)   __X__ Use CHG Soap/SAGE wipes as directed  __X__ Use inhalers on the day of surgery  ____ Stop metformin/Janumet/Farxiga 2 days prior to surgery    ____ Take 1/2 of usual insulin dose the night before surgery. No insulin the morning          of surgery.   ____ Stop Blood Thinners Coumadin/Plavix/Xarelto/Pleta/Pradaxa/Eliquis/Effient/Aspirin  on   Or contact your Surgeon, Cardiologist or Medical Doctor regarding  ability to stop your blood thinners  __X__ Stop Anti-inflammatories 7 days before surgery such as Advil, Ibuprofen, Motrin,  BC or Goodies Powder, Naprosyn, Naproxen, Aleve, Aspirin    __X__ Stop all herbal supplements, fish oil or vitamin E until after surgery.  MELATONIN AND IBUPROFEN STOP TODAY 08/17/20  ____ Bring C-Pap to the hospital.

## 2020-08-20 ENCOUNTER — Other Ambulatory Visit: Admission: RE | Admit: 2020-08-20 | Payer: Medicare Other | Source: Ambulatory Visit

## 2020-08-21 ENCOUNTER — Telehealth: Payer: Self-pay

## 2020-08-21 ENCOUNTER — Other Ambulatory Visit: Payer: Medicare Other

## 2020-08-21 NOTE — Telephone Encounter (Signed)
I returned patients call. She was asking about arrival times and Covid testing. I confirmed with Katie in PAT that she is getting a rapid covid test done on [facility].  Pt has scheduled transportation and plans to arrive at 7:30am. Her surgery is on the OR board to begin 10:08 AM.

## 2020-08-23 ENCOUNTER — Other Ambulatory Visit
Admission: RE | Admit: 2020-08-23 | Discharge: 2020-08-23 | Disposition: A | Discharge status: Home / Self Care | Payer: Medicare Other | Source: Ambulatory Visit | Attending: Obstetrics and Gynecology | Admitting: Obstetrics and Gynecology

## 2020-08-23 ENCOUNTER — Other Ambulatory Visit: Payer: Self-pay

## 2020-08-23 ENCOUNTER — Ambulatory Visit
Admission: RE | Admit: 2020-08-23 | Discharge: 2020-08-23 | Disposition: A | Discharge status: Home / Self Care | Payer: Medicare Other | Attending: Obstetrics and Gynecology | Admitting: Obstetrics and Gynecology

## 2020-08-23 ENCOUNTER — Ambulatory Visit: Payer: Medicare Other | Admitting: Anesthesiology

## 2020-08-23 ENCOUNTER — Encounter: Payer: Self-pay | Admitting: Obstetrics and Gynecology

## 2020-08-23 ENCOUNTER — Encounter
Admission: RE | Disposition: A | Discharge status: Home / Self Care | Payer: Self-pay | Source: Home / Self Care | Attending: Obstetrics and Gynecology

## 2020-08-23 DIAGNOSIS — E119 Type 2 diabetes mellitus without complications: Secondary | ICD-10-CM | POA: Diagnosis not present

## 2020-08-23 DIAGNOSIS — Z888 Allergy status to other drugs, medicaments and biological substances status: Secondary | ICD-10-CM | POA: Insufficient documentation

## 2020-08-23 DIAGNOSIS — E785 Hyperlipidemia, unspecified: Secondary | ICD-10-CM | POA: Diagnosis not present

## 2020-08-23 DIAGNOSIS — Z8261 Family history of arthritis: Secondary | ICD-10-CM | POA: Diagnosis not present

## 2020-08-23 DIAGNOSIS — Z20822 Contact with and (suspected) exposure to covid-19: Secondary | ICD-10-CM | POA: Diagnosis not present

## 2020-08-23 DIAGNOSIS — Z7984 Long term (current) use of oral hypoglycemic drugs: Secondary | ICD-10-CM | POA: Insufficient documentation

## 2020-08-23 DIAGNOSIS — G4730 Sleep apnea, unspecified: Secondary | ICD-10-CM | POA: Insufficient documentation

## 2020-08-23 DIAGNOSIS — Z811 Family history of alcohol abuse and dependence: Secondary | ICD-10-CM | POA: Diagnosis not present

## 2020-08-23 DIAGNOSIS — Z79899 Other long term (current) drug therapy: Secondary | ICD-10-CM | POA: Diagnosis not present

## 2020-08-23 DIAGNOSIS — Z634 Disappearance and death of family member: Secondary | ICD-10-CM | POA: Diagnosis not present

## 2020-08-23 DIAGNOSIS — J449 Chronic obstructive pulmonary disease, unspecified: Secondary | ICD-10-CM | POA: Diagnosis not present

## 2020-08-23 DIAGNOSIS — Z79890 Hormone replacement therapy (postmenopausal): Secondary | ICD-10-CM | POA: Insufficient documentation

## 2020-08-23 DIAGNOSIS — Z8049 Family history of malignant neoplasm of other genital organs: Secondary | ICD-10-CM | POA: Insufficient documentation

## 2020-08-23 DIAGNOSIS — F17210 Nicotine dependence, cigarettes, uncomplicated: Secondary | ICD-10-CM | POA: Insufficient documentation

## 2020-08-23 DIAGNOSIS — Z803 Family history of malignant neoplasm of breast: Secondary | ICD-10-CM | POA: Insufficient documentation

## 2020-08-23 DIAGNOSIS — Z825 Family history of asthma and other chronic lower respiratory diseases: Secondary | ICD-10-CM | POA: Diagnosis not present

## 2020-08-23 DIAGNOSIS — N879 Dysplasia of cervix uteri, unspecified: Secondary | ICD-10-CM | POA: Diagnosis not present

## 2020-08-23 DIAGNOSIS — N8500 Endometrial hyperplasia, unspecified: Secondary | ICD-10-CM | POA: Diagnosis not present

## 2020-08-23 DIAGNOSIS — Z809 Family history of malignant neoplasm, unspecified: Secondary | ICD-10-CM | POA: Insufficient documentation

## 2020-08-23 DIAGNOSIS — Z8349 Family history of other endocrine, nutritional and metabolic diseases: Secondary | ICD-10-CM | POA: Diagnosis not present

## 2020-08-23 DIAGNOSIS — N858 Other specified noninflammatory disorders of uterus: Secondary | ICD-10-CM | POA: Diagnosis not present

## 2020-08-23 DIAGNOSIS — Z818 Family history of other mental and behavioral disorders: Secondary | ICD-10-CM | POA: Insufficient documentation

## 2020-08-23 DIAGNOSIS — K219 Gastro-esophageal reflux disease without esophagitis: Secondary | ICD-10-CM | POA: Diagnosis not present

## 2020-08-23 DIAGNOSIS — Z813 Family history of other psychoactive substance abuse and dependence: Secondary | ICD-10-CM | POA: Insufficient documentation

## 2020-08-23 DIAGNOSIS — N8502 Endometrial intraepithelial neoplasia [EIN]: Secondary | ICD-10-CM | POA: Diagnosis present

## 2020-08-23 DIAGNOSIS — Z8249 Family history of ischemic heart disease and other diseases of the circulatory system: Secondary | ICD-10-CM | POA: Insufficient documentation

## 2020-08-23 DIAGNOSIS — I129 Hypertensive chronic kidney disease with stage 1 through stage 4 chronic kidney disease, or unspecified chronic kidney disease: Secondary | ICD-10-CM | POA: Diagnosis not present

## 2020-08-23 DIAGNOSIS — N189 Chronic kidney disease, unspecified: Secondary | ICD-10-CM | POA: Diagnosis not present

## 2020-08-23 HISTORY — PX: HYSTEROSCOPY WITH D & C: SHX1775

## 2020-08-23 LAB — SARS CORONAVIRUS 2 BY RT PCR (HOSPITAL ORDER, PERFORMED IN ~~LOC~~ HOSPITAL LAB): SARS Coronavirus 2: NEGATIVE

## 2020-08-23 LAB — CBC
HCT: 45.1 % (ref 36.0–46.0)
Hemoglobin: 16 g/dL — ABNORMAL HIGH (ref 12.0–15.0)
MCH: 30.5 pg (ref 26.0–34.0)
MCHC: 35.5 g/dL (ref 30.0–36.0)
MCV: 86.1 fL (ref 80.0–100.0)
Platelets: 169 10*3/uL (ref 150–400)
RBC: 5.24 MIL/uL — ABNORMAL HIGH (ref 3.87–5.11)
RDW: 13.4 % (ref 11.5–15.5)
WBC: 7.1 10*3/uL (ref 4.0–10.5)
nRBC: 0 % (ref 0.0–0.2)

## 2020-08-23 LAB — GLUCOSE, CAPILLARY
Glucose-Capillary: 247 mg/dL — ABNORMAL HIGH (ref 70–99)
Glucose-Capillary: 247 mg/dL — ABNORMAL HIGH (ref 70–99)
Glucose-Capillary: 247 mg/dL — ABNORMAL HIGH (ref 70–99)
Glucose-Capillary: 178 mg/dL — ABNORMAL HIGH (ref 70–99)

## 2020-08-23 LAB — TYPE AND SCREEN
ABO/RH(D): O POS
Antibody Screen: NEGATIVE

## 2020-08-23 LAB — ABO/RH: ABO/RH(D): O POS

## 2020-08-23 SURGERY — DILATATION AND CURETTAGE /HYSTEROSCOPY
Anesthesia: General

## 2020-08-23 MED ORDER — IPRATROPIUM-ALBUTEROL 0.5-2.5 (3) MG/3ML IN SOLN: 3.0000 mL | Freq: Once | RESPIRATORY_TRACT | Status: AC

## 2020-08-23 MED ORDER — FENTANYL CITRATE (PF) 100 MCG/2ML IJ SOLN
INTRAMUSCULAR | Status: AC
  Administered 2020-08-23: 25 ug via INTRAVENOUS
  Filled 2020-08-23: qty 2

## 2020-08-23 MED ORDER — PROPOFOL 10 MG/ML IV BOLUS
INTRAVENOUS | Status: DC | PRN
  Administered 2020-08-23: 120 mg via INTRAVENOUS

## 2020-08-23 MED ORDER — LIDOCAINE HCL (CARDIAC) PF 100 MG/5ML IV SOSY
PREFILLED_SYRINGE | INTRAVENOUS | Status: DC | PRN
  Administered 2020-08-23: 100 mg via INTRAVENOUS

## 2020-08-23 MED ORDER — ONDANSETRON HCL 4 MG/2ML IJ SOLN
INTRAMUSCULAR | Status: DC | PRN
  Administered 2020-08-23 (×2): 4 mg via INTRAVENOUS

## 2020-08-23 MED ORDER — DEXAMETHASONE SODIUM PHOSPHATE 10 MG/ML IJ SOLN
INTRAMUSCULAR | Status: DC | PRN
  Administered 2020-08-23: 10 mg via INTRAVENOUS

## 2020-08-23 MED ORDER — LACTATED RINGERS IV SOLN: INTRAVENOUS | Status: DC

## 2020-08-23 MED ORDER — FENTANYL CITRATE (PF) 100 MCG/2ML IJ SOLN
INTRAMUSCULAR | Status: DC | PRN
  Administered 2020-08-23: 50 ug via INTRAVENOUS

## 2020-08-23 MED ORDER — CHLORHEXIDINE GLUCONATE 0.12 % MT SOLN
15.0000 mL | Freq: Once | OROMUCOSAL | Status: AC
  Administered 2020-08-23: 15 mL via OROMUCOSAL

## 2020-08-23 MED ORDER — SODIUM CHLORIDE 0.9 % IV SOLN: INTRAVENOUS | Status: DC

## 2020-08-23 MED ORDER — ACETAMINOPHEN 500 MG PO TABS: 1000.0000 mg | ORAL_TABLET | Freq: Four times a day (QID) | ORAL | 0 refills | Status: DC | PRN

## 2020-08-23 MED ORDER — ALBUTEROL SULFATE HFA 108 (90 BASE) MCG/ACT IN AERS
INHALATION_SPRAY | RESPIRATORY_TRACT | Status: DC | PRN
  Administered 2020-08-23: 2 via RESPIRATORY_TRACT

## 2020-08-23 MED ORDER — FENTANYL CITRATE (PF) 100 MCG/2ML IJ SOLN
INTRAMUSCULAR | Status: AC
  Filled 2020-08-23: qty 2

## 2020-08-23 MED ORDER — OXYCODONE HCL 5 MG PO TABS: 5.0000 mg | ORAL_TABLET | Freq: Once | ORAL | Status: DC | PRN

## 2020-08-23 MED ORDER — FERRIC SUBSULFATE 259 MG/GM EX SOLN
CUTANEOUS | Status: AC
  Filled 2020-08-23: qty 8

## 2020-08-23 MED ORDER — ORAL CARE MOUTH RINSE: 15.0000 mL | Freq: Once | OROMUCOSAL | Status: AC

## 2020-08-23 MED ORDER — LIDOCAINE HCL (PF) 2 % IJ SOLN
INTRAMUSCULAR | Status: AC
  Filled 2020-08-23: qty 10

## 2020-08-23 MED ORDER — ALBUTEROL SULFATE HFA 108 (90 BASE) MCG/ACT IN AERS
INHALATION_SPRAY | RESPIRATORY_TRACT | Status: AC
  Filled 2020-08-23: qty 13.4

## 2020-08-23 MED ORDER — IPRATROPIUM-ALBUTEROL 0.5-2.5 (3) MG/3ML IN SOLN
RESPIRATORY_TRACT | Status: AC
  Administered 2020-08-23: 3 mL via RESPIRATORY_TRACT
  Filled 2020-08-23: qty 3

## 2020-08-23 MED ORDER — MIDAZOLAM HCL 2 MG/2ML IJ SOLN
INTRAMUSCULAR | Status: DC | PRN
  Administered 2020-08-23: 2 mg via INTRAVENOUS

## 2020-08-23 MED ORDER — IPRATROPIUM-ALBUTEROL 0.5-2.5 (3) MG/3ML IN SOLN: 3.0000 mL | RESPIRATORY_TRACT | Status: DC

## 2020-08-23 MED ORDER — POVIDONE-IODINE 10 % EX SWAB
2.0000 "application " | Freq: Once | CUTANEOUS | Status: AC
  Administered 2020-08-23: 2 via TOPICAL

## 2020-08-23 MED ORDER — ACETAMINOPHEN 10 MG/ML IV SOLN: 1000.0000 mg | Freq: Once | INTRAVENOUS | Status: DC | PRN

## 2020-08-23 MED ORDER — FENTANYL CITRATE (PF) 100 MCG/2ML IJ SOLN
25.0000 ug | INTRAMUSCULAR | Status: DC | PRN
Start: 2020-08-23 — End: 2020-08-23
  Administered 2020-08-23: 50 ug via INTRAVENOUS

## 2020-08-23 MED ORDER — DEXAMETHASONE SODIUM PHOSPHATE 10 MG/ML IJ SOLN
INTRAMUSCULAR | Status: AC
  Filled 2020-08-23: qty 1

## 2020-08-23 MED ORDER — GLYCOPYRROLATE 0.2 MG/ML IJ SOLN
INTRAMUSCULAR | Status: DC | PRN
  Administered 2020-08-23: .2 mg via INTRAVENOUS

## 2020-08-23 MED ORDER — ACETAMINOPHEN 10 MG/ML IV SOLN
INTRAVENOUS | Status: DC | PRN
  Administered 2020-08-23: 1000 mg via INTRAVENOUS

## 2020-08-23 MED ORDER — ONDANSETRON HCL 4 MG/2ML IJ SOLN: 4.0000 mg | Freq: Once | INTRAMUSCULAR | Status: DC | PRN

## 2020-08-23 MED ORDER — ACETAMINOPHEN 10 MG/ML IV SOLN
INTRAVENOUS | Status: AC
  Filled 2020-08-23: qty 100

## 2020-08-23 MED ORDER — SUGAMMADEX SODIUM 200 MG/2ML IV SOLN
INTRAVENOUS | Status: DC | PRN
  Administered 2020-08-23: 300 mg via INTRAVENOUS

## 2020-08-23 MED ORDER — GLYCOPYRROLATE 0.2 MG/ML IJ SOLN
INTRAMUSCULAR | Status: AC
  Filled 2020-08-23: qty 2

## 2020-08-23 MED ORDER — IODINE STRONG (LUGOLS) 5 % PO SOLN
ORAL | Status: AC
  Filled 2020-08-23: qty 1

## 2020-08-23 MED ORDER — ROCURONIUM BROMIDE 100 MG/10ML IV SOLN
INTRAVENOUS | Status: DC | PRN
  Administered 2020-08-23: 60 mg via INTRAVENOUS

## 2020-08-23 MED ORDER — CHLORHEXIDINE GLUCONATE 0.12 % MT SOLN
OROMUCOSAL | Status: AC
  Filled 2020-08-23: qty 15

## 2020-08-23 MED ORDER — ONDANSETRON HCL 4 MG/2ML IJ SOLN
INTRAMUSCULAR | Status: AC
  Filled 2020-08-23: qty 8

## 2020-08-23 MED ORDER — SILVER NITRATE-POT NITRATE 75-25 % EX MISC
CUTANEOUS | Status: DC | PRN
  Administered 2020-08-23: 2

## 2020-08-23 MED ORDER — MIDAZOLAM HCL 2 MG/2ML IJ SOLN
INTRAMUSCULAR | Status: AC
  Filled 2020-08-23: qty 2

## 2020-08-23 MED ORDER — OXYCODONE HCL 5 MG/5ML PO SOLN: 5.0000 mg | Freq: Once | ORAL | Status: DC | PRN

## 2020-08-23 MED ORDER — DROPERIDOL 2.5 MG/ML IJ SOLN
0.6250 mg | Freq: Once | INTRAMUSCULAR | Status: AC
  Administered 2020-08-23: 0.625 mg via INTRAVENOUS
  Filled 2020-08-23 (×2): qty 2

## 2020-08-23 SURGICAL SUPPLY — 19 items
CATH ROBINSON RED A/P 16FR (CATHETERS) ×2 IMPLANT
DEVICE MYOSURE LITE (MISCELLANEOUS) IMPLANT
DEVICE MYOSURE REACH (MISCELLANEOUS) ×2 IMPLANT
ELECT REM PT RETURN 9FT ADLT (ELECTROSURGICAL)
ELECTRODE REM PT RTRN 9FT ADLT (ELECTROSURGICAL) IMPLANT
GAUZE 4X4 16PLY RFD (DISPOSABLE) ×2 IMPLANT
GLOVE SURG SYN 6.5 ES PF (GLOVE) ×2 IMPLANT
GLOVE SURG UNDER POLY LF SZ6.5 (GLOVE) ×4 IMPLANT
GOWN STRL REUS W/ TWL LRG LVL3 (GOWN DISPOSABLE) ×2 IMPLANT
GOWN STRL REUS W/TWL LRG LVL3 (GOWN DISPOSABLE) ×2
IV NS IRRIG 3000ML ARTHROMATIC (IV SOLUTION) ×2 IMPLANT
KIT PROCEDURE FLUENT (KITS) ×2 IMPLANT
MANIFOLD NEPTUNE II (INSTRUMENTS) ×2 IMPLANT
PACK DNC HYST (MISCELLANEOUS) ×2 IMPLANT
PAD OB MATERNITY 4.3X12.25 (PERSONAL CARE ITEMS) ×2 IMPLANT
PAD PREP 24X41 OB/GYN DISP (PERSONAL CARE ITEMS) ×2 IMPLANT
PENCIL ELECTRO HAND CTR (MISCELLANEOUS) ×2 IMPLANT
SEAL ROD LENS SCOPE MYOSURE (ABLATOR) ×2 IMPLANT
TOWEL OR 17X26 4PK STRL BLUE (TOWEL DISPOSABLE) ×2 IMPLANT

## 2020-08-23 NOTE — Progress Notes (Signed)
Dr. Bertell Maria at bedside.  Patient placed back on room air.  Per Dr. Bertell Maria continue to monitor on room air for 30 minutes and if patient remains above 88% sats RA ok to go home.

## 2020-08-23 NOTE — Transfer of Care (Signed)
Immediate Anesthesia Transfer of Care Note  Patient: Valerie Lamb  Procedure(s) Performed: DILATATION AND CURETTAGE /HYSTEROSCOPY, polypectomy (N/A )  Patient Location: PACU  Anesthesia Type:General  Level of Consciousness: awake, drowsy and patient cooperative  Airway & Oxygen Therapy: Patient Spontanous Breathing and Patient connected to face mask oxygen  Post-op Assessment: Report given to RN and Post -op Vital signs reviewed and stable  Post vital signs: Reviewed and stable  Last Vitals:  Vitals Value Taken Time  BP 101/62 08/23/20 1145  Temp 36.1 C 08/23/20 1136  Pulse 76 08/23/20 1147  Resp 13 08/23/20 1147  SpO2 93 % 08/23/20 1147  Vitals shown include unvalidated device data.  Last Pain:  Vitals:   08/23/20 1147  TempSrc:   PainSc: 5          Complications: No complications documented.

## 2020-08-23 NOTE — Op Note (Signed)
Operative Note  08/23/2020  PRE-OP DIAGNOSIS: Endometrial hyperplasia without atypia, VAIN  POST-OP DIAGNOSIS: same   SURGEON: Adrian Prows MD  PROCEDURE: Procedure(s): DILATATION AND CURETTAGE /HYSTEROSCOPY, polypectomy   ANESTHESIA: General   ESTIMATED BLOOD LOSS: 10 cc   SPECIMENS:  Endometrial curettings  FLUID DEFICIT: 95 cc  COMPLICATIONS: None  DISPOSITION: PACU - hemodynamically stable.  CONDITION: stable  FINDINGS: Exam under anesthesia revealed  8 cm uterus with bilateral adnexa without masses or fullness. Hysteroscopy revealed endometrial polyp in the uterine cavity with bilateral tubal ostia and normal appearing endocervical canal. Thick septation at the fundus of  the uterine cavity.   PROCEDURE IN DETAIL: After informed consent was obtained, the patient was taken to the operating room where anesthesia was obtained without difficulty. The patient was positioned in the dorsal lithotomy position in [place]. The patient's bladder was catheterized with an in and out foley catheter. The patient was examined under anesthesia, with the above noted findings. The weightedspeculum was placed inside the patient's vagina, and the the anterior lip of the cervix was seen and grasped with the tenaculum.  The uterine cavity was sounded to 8 cm, and then the cervix was progressively dilated to a 18 French-Pratt dilator. The 0 degree hysteroscope was introduced, with saline fluid used to distend the intrauterine cavity, with the above noted findings.  The Myosure was used to remove the uterine polyp.  Once the cavity was sampled entirely the hysteroscope was removed.   The uterine cavity was curetted until a gritty texture was noted, yielding endometrial curettings. Excellent hemostasis was noted, and all instruments were removed, with excellent hemostasis noted throughout. She was then taken out of dorsal lithotomy.  Minimal discrepancy in fluid was noted.  The patient  tolerated the procedure well. Sponge, lap and needle counts were correct x2. The patient was taken to recovery room in excellent condition.  Excision of remaining VAIN lesion was planned however this was not performed. The biopsy site suggested the lesion had been removed in entirety and lesion was slow healing. Vaginal inspection did not show any other concerning lesions.  Adrian Prows MD [facility] 08/23/2020 11:35 AM

## 2020-08-23 NOTE — Anesthesia Procedure Notes (Signed)
Procedure Name: Intubation Performed by: Fletcher-Harrison, Gershom Brobeck, CRNA Pre-anesthesia Checklist: Patient identified, Emergency Drugs available, Suction available and Patient being monitored Patient Re-evaluated:Patient Re-evaluated prior to induction Oxygen Delivery Method: Circle system utilized Preoxygenation: Pre-oxygenation with 100% oxygen Induction Type: IV induction Ventilation: Mask ventilation without difficulty Laryngoscope Size: McGraph and 3 Grade View: Grade I Tube type: Oral Tube size: 7.0 mm Number of attempts: 1 Airway Equipment and Method: Stylet and Oral airway Placement Confirmation: ETT inserted through vocal cords under direct vision,  positive ETCO2,  breath sounds checked- equal and bilateral and CO2 detector Secured at: 21 cm Tube secured with: Tape Dental Injury: Teeth and Oropharynx as per pre-operative assessment        

## 2020-08-23 NOTE — Discharge Instructions (Signed)
Dilation and Curettage or Vacuum Curettage, Care After This sheet gives you information about how to care for yourself after your procedure. Your doctor may also give you more specific instructions. If you have problems or questions, contact your doctor. What can I expect after the procedure? After the procedure, it is common to have:  Mild pain or cramping.  Some bleeding or spotting from the vagina. These may last for up to 2 weeks. Follow these instructions at home: Medicines  Take over-the-counter and prescription medicines only as told by your doctor. This is very important if you take blood-thinning medicine.  Ask your doctor if the medicine prescribed to you requires you to avoid driving or using machinery. Activity  If you were given a medicine to help you relax (sedative) during your procedure, it can affect you for many hours. Do not drive or use machinery until your doctor says that it is safe.  Rest as told by your doctor.  Do not sit for a long time without moving. Get up to take short walks every 1-2 hours. This is important. Ask for help if you feel weak or unsteady.  Do not lift anything that is heavier than 10 lb (4.5 kg), or the limit that you are told, until your doctor says that it is safe.  Return to your normal activities as told by your doctor. Ask your doctor what activities are safe for you.   Lifestyle For at least 2 weeks, or as long as told by your doctor:  Do not douche.  Do not use tampons.  Do not have sex. General instructions  Wear compression stockings as told by your doctor.  It is up to you to get the results of your procedure. Ask your doctor, or the department that is doing the procedure, when your results will be ready.  Keep all follow-up visits as told by your doctor. This is important. Contact a doctor if:  You have very bad cramps that get worse or do not get better with medicine.  You have very bad pain in your belly  (abdomen).  You cannot drink fluids without vomiting.  You have pain in a different part of your pelvis. The pelvis is the area just above your thighs.  You have fluid from your vagina that smells bad.  You have a rash. Get help right away if:  You are bleeding a lot from your vagina. A lot of bleeding means soaking more than one sanitary pad in 1 hour for 2 hours in a row.  You have a fever that is above 100.31F (38.0C).  Your belly feels very tender or hard.  You have chest pain.  You have trouble breathing.  You feel dizzy.  You feel light-headed.  You pass out (faint).  You have pain in your neck or shoulder area. These symptoms may be an emergency. Do not wait to see if the symptoms will go away. Get medical help right away. Call your local emergency services (911 in the [place] Do not drive yourself to the hospital. Summary  After your procedure, it is common to have pain or cramping. It is also common to have bleeding or spotting from your vagina.  Rest as told. Do not sit for a long time without moving. Get up to take short walks every 1-2 hours.  Do not lift anything that is heavier than 10 lb (4.5 kg), or the limit that you are told.  Contact your doctor if you have fluid from  your vagina that smells bad.  Get help right away if you develop any problems from the procedure. Ask your doctor what problems to watch for. This information is not intended to replace advice given to you by your health care provider. Make sure you discuss any questions you have with your health care provider. Document Revised: 06/21/2019 Document Reviewed: 06/21/2019 Elsevier Patient Education  2021 [place]   1) The drugs that you were given will stay in your system until tomorrow so for the next 24 hours you should not:  A) Drive an automobile B) Make any legal decisions C) Drink any alcoholic beverage   2) You may resume  regular meals tomorrow.  Today it is better to start with liquids and gradually work up to solid foods.  You may eat anything you prefer, but it is better to start with liquids, then soup and crackers, and gradually work up to solid foods.   3) Please notify your doctor immediately if you have any unusual bleeding, trouble breathing, redness and pain at the surgery site, drainage, fever, or pain not relieved by medication.  4) Your post-operative visit with Dr.                                     is: Date:                        Time:    Please call to schedule your post-operative visit.  5) Additional Instructions:

## 2020-08-23 NOTE — Anesthesia Preprocedure Evaluation (Signed)
Anesthesia Evaluation  Patient identified by MRN, date of birth, ID band Patient awake    Reviewed: Allergy & Precautions, NPO status , Patient's Chart, lab work & pertinent test results  History of Anesthesia Complications Negative for: history of anesthetic complications  Airway Mallampati: II  TM Distance: >3 FB Neck ROM: Full    Dental  (+) Edentulous Lower, Edentulous Upper   Pulmonary sleep apnea and Continuous Positive Airway Pressure Ventilation , COPD,  COPD inhaler, Current SmokerPatient did not abstain from smoking.,  Has oxygen at home, uses it about once per week    + decreased breath sounds      Cardiovascular Exercise Tolerance: Poor METShypertension, (-) CAD and (-) Past MI (-) dysrhythmias  Rhythm:Regular Rate:Normal - Systolic murmurs    Neuro/Psych PSYCHIATRIC DISORDERS Anxiety Depression negative neurological ROS     GI/Hepatic GERD  Controlled and Medicated,(+)     (-) substance abuse  ,   Endo/Other  diabetesHypothyroidism Morbid obesity  Renal/GU CRFRenal disease     Musculoskeletal   Abdominal (+) + obese,   Peds  Hematology   Anesthesia Other Findings Past Medical History: No date: Anemia No date: Anxiety No date: COPD (chronic obstructive pulmonary disease) (HCC) No date: Depression No date: Diabetes mellitus without complication (HCC) No date: Endometriosis No date: Gastric ulcer No date: Hypertension No date: Sleep apnea No date: Thyroid disease  Reproductive/Obstetrics                             Anesthesia Physical Anesthesia Plan  ASA: III  Anesthesia Plan: General   Post-op Pain Management:    Induction: Intravenous  PONV Risk Score and Plan: 2 and Ondansetron, Dexamethasone and Treatment may vary due to age or medical condition  Airway Management Planned: Oral ETT  Additional Equipment: None  Intra-op Plan:   Post-operative Plan:  Extubation in OR  Informed Consent: I have reviewed the patients History and Physical, chart, labs and discussed the procedure including the risks, benefits and alternatives for the proposed anesthesia with the patient or authorized representative who has indicated his/her understanding and acceptance.     Dental advisory given  Plan Discussed with: CRNA and Surgeon  Anesthesia Plan Comments: (Discussed risks of anesthesia with patient, including PONV, sore throat, lip/dental damage. Rare risks discussed as well, such as cardiorespiratory and neurological sequelae. Patient understands. Patient counseled on being higher risk for anesthesia due to comorbidities: oxygen dependent COPD. Patient was told about increased risk of cardiac and respiratory events, including death. Patient understands. Will administer albuterol preop)        Anesthesia Quick Evaluation

## 2020-08-23 NOTE — Interval H&P Note (Signed)
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  The various methods of treatment have been discussed with the patient and family. After consideration of risks, benefits and other options for treatment, the patient has consented to  Procedure(s): VAGINAL BIOPSY (N/A) DILATATION AND CURETTAGE /HYSTEROSCOPY (N/A) as a surgical intervention.  The patient's history has been reviewed, patient examined, no change in status, stable for surgery.  I have reviewed the patient's chart and labs.  Questions were answered to the patient's satisfaction.     [place]

## 2020-08-24 LAB — SURGICAL PATHOLOGY

## 2020-08-24 NOTE — Anesthesia Postprocedure Evaluation (Signed)
Anesthesia Post Note  Patient: Valerie Lamb  Procedure(s) Performed: DILATATION AND CURETTAGE /HYSTEROSCOPY, polypectomy (N/A )  Patient location during evaluation: PACU Anesthesia Type: General Level of consciousness: awake and alert Pain management: pain level controlled Vital Signs Assessment: post-procedure vital signs reviewed and stable Respiratory status: spontaneous breathing, nonlabored ventilation, respiratory function stable and patient connected to nasal cannula oxygen Cardiovascular status: blood pressure returned to baseline and stable Postop Assessment: no apparent nausea or vomiting Anesthetic complications: no   No complications documented.   Last Vitals:  Vitals:   08/23/20 1330 08/23/20 1351  BP: (!) 95/57 (!) (P) 105/56  Pulse: 75 (P) 85  Resp: 13 (P) 12  Temp: (!) 36.1 C (P) 36.4 C  SpO2: (!) 89% (P) 95%    Last Pain:  Vitals:   08/23/20 1351  TempSrc: (P) Temporal  PainSc:                  Arita Miss

## 2020-08-29 ENCOUNTER — Encounter: Payer: Self-pay | Admitting: Family Medicine

## 2020-08-29 ENCOUNTER — Other Ambulatory Visit: Payer: Self-pay

## 2020-08-29 ENCOUNTER — Encounter: Payer: Self-pay | Admitting: Obstetrics and Gynecology

## 2020-08-29 ENCOUNTER — Ambulatory Visit (INDEPENDENT_AMBULATORY_CARE_PROVIDER_SITE_OTHER): Payer: Medicare Other | Admitting: Obstetrics and Gynecology

## 2020-08-29 VITALS — Wt 258.0 lb

## 2020-08-29 DIAGNOSIS — N8502 Endometrial intraepithelial neoplasia [EIN]: Secondary | ICD-10-CM | POA: Diagnosis not present

## 2020-08-29 NOTE — Progress Notes (Signed)
Virtual Visit via Telephone Note  I connected with Valerie Lamb on 08/29/20 at  9:50 AM EDT by telephone and verified that I am speaking with the correct person using two identifiers.   I discussed the limitations, risks, security and privacy concerns of performing an evaluation and management service by telephone and the availability of in person appointments. I also discussed with the patient that there may be a patient responsible charge related to this service. The patient expressed understanding and agreed to proceed.  The patient was at home I spoke with the patient from my  office The names of people involved in this encounter were: Dr. Gilman Schmidt and Valerie Lamb.  History of Present Illness: She is feeling ill today. She  Has been having nausea and vomiting and a cough. She has not check her blood glucose today. She is having minimal to no vaginal bleeding since the procedure. She is continuing to take her Provera   Observations/Objective:  Physical Exam could not be performed. Because of the COVID-19 outbreak this visit was performed over the phone and not in person.   Assessment and Plan: 65 yo with postmenopausal bleeding and EIN on Hysteroscopy D&C Will need hysterectomy but patient needs improved glucose control piror to surgery with optimized hgba1c less than 8. Blood glucose levels need to be less than 180 to optimize surgery and her recovery.  Until optimal glucose control is achieved and surgery can be planned patient should continue daily 10 mg provera.  Would need repeat biopsy in 3 and 6 months if she has not had a hysterectomy by that time.  Will follow up with the patient in 4 weeks.  Encouraged her to reach out tho her PCP regarding her current illness and to check a blood glucose value at home.   Follow Up Instructions: Follow up in 4 weeks.    I discussed the assessment and treatment plan with the patient. The patient was provided an opportunity to ask questions  and all were answered. The patient agreed with the plan and demonstrated an understanding of the instructions.   The patient was advised to call back or seek an in-person evaluation if the symptoms worsen or if the condition fails to improve as anticipated.  I provided 15 minutes of non-face-to-face time during this encounter.  Adrian Prows MD [facility], [facility] 08/29/2020 12:31 PM     Adrian Prows MD, [facility], [facility] 08/29/2020 10:17 AM

## 2020-09-03 ENCOUNTER — Other Ambulatory Visit: Payer: Self-pay | Admitting: Family Medicine

## 2020-09-03 DIAGNOSIS — R251 Tremor, unspecified: Secondary | ICD-10-CM

## 2020-09-03 NOTE — Telephone Encounter (Signed)
fyi

## 2020-09-04 ENCOUNTER — Encounter: Payer: Self-pay | Admitting: Family Medicine

## 2020-09-04 DIAGNOSIS — N939 Abnormal uterine and vaginal bleeding, unspecified: Secondary | ICD-10-CM

## 2020-09-04 DIAGNOSIS — N8501 Benign endometrial hyperplasia: Secondary | ICD-10-CM

## 2020-09-04 NOTE — Addendum Note (Signed)
Addended by: Lesleigh Noe on: 09/04/2020 03:48 PM   Modules accepted: Orders

## 2020-09-05 ENCOUNTER — Telehealth: Payer: Self-pay | Admitting: *Deleted

## 2020-09-05 ENCOUNTER — Encounter: Payer: Self-pay | Admitting: Family Medicine

## 2020-09-05 ENCOUNTER — Other Ambulatory Visit: Payer: Self-pay

## 2020-09-05 MED ORDER — OMEPRAZOLE 40 MG PO CPDR: 40.0000 mg | DELAYED_RELEASE_CAPSULE | Freq: Two times a day (BID) | ORAL | 1 refills | Status: DC

## 2020-09-05 NOTE — Telephone Encounter (Signed)
Noted  

## 2020-09-05 NOTE — Telephone Encounter (Signed)
Contacted related to referral for lung cancer screening. Provided eduction related to why we screen for lung cancer and the benefit of early detection. Patient is adamant that she does not want to know and will not do anything about a lung cancer if found. She knows she can contact me if she has any questions in the future.

## 2020-09-13 ENCOUNTER — Other Ambulatory Visit: Payer: Self-pay

## 2020-09-13 ENCOUNTER — Encounter: Payer: Self-pay | Admitting: Family Medicine

## 2020-09-13 MED ORDER — PIOGLITAZONE HCL 30 MG PO TABS: 30.0000 mg | ORAL_TABLET | Freq: Every day | ORAL | 0 refills | Status: DC

## 2020-09-13 NOTE — Telephone Encounter (Signed)
Pt requested refill.  Last refilled by historical provider.  Last OV: 08/13/20  Next OV: 10/09/20

## 2020-10-02 ENCOUNTER — Encounter: Payer: Self-pay | Admitting: Family Medicine

## 2020-10-02 ENCOUNTER — Other Ambulatory Visit: Payer: Self-pay

## 2020-10-02 DIAGNOSIS — E1169 Type 2 diabetes mellitus with other specified complication: Secondary | ICD-10-CM

## 2020-10-02 MED ORDER — TRULICITY 1.5 MG/0.5ML ~~LOC~~ SOAJ: 1.5000 mg | SUBCUTANEOUS | 0 refills | Status: DC

## 2020-10-05 ENCOUNTER — Encounter: Payer: Self-pay | Admitting: Pharmacist

## 2020-10-05 DIAGNOSIS — Z5181 Encounter for therapeutic drug level monitoring: Secondary | ICD-10-CM

## 2020-10-05 NOTE — Progress Notes (Signed)
                                         Valerie Lamb)                                            Valerie Lamb                                        Statin Quality Measure Assessment    10/05/2020  Valerie Lamb 1956/03/21 [id number]      Per review of chart and payor information, patient has a diagnosis of diabetes but is not currently filling a statin prescription.  This places patient into the SUPD (Statin Use In Patients with Diabetes) measure for CMS.    ASCVD risk 12.9% Lovastatin is on her medication list but has not been filled since 12/21.  Called patient.  She said she is still taking it but she did not verify the last fill date on her bottle.    If deemed therapeutically appropriate, please review the patient's statin therapy with her at her upcoming appointment ton 11/09/20.     Component Value Date/Time   CHOL 173 07/12/2020 1240   TRIG 138.0 07/12/2020 1240   HDL 41.90 07/12/2020 1240   CHOLHDL 4 07/12/2020 1240   VLDL 27.6 07/12/2020 1240   LDLCALC 103 (H) 07/12/2020 1240    Please consider ONE of the following recommendations:  ? Initiate high intensity statin Atorvastatin 40mg  once daily, #90, 3 refills   Rosuvastatin 20mg  once daily, #90, 3 refills    ? Initiate moderate intensity          statin with reduced frequency if prior          statin intolerance 1x weekly, #13, 3 refills   2x weekly, #26, 3 refills   3x weekly, #39, 3 refills    ? Code for past statin intolerance or  other exclusions (required annually)  Provider Requirements: ? Associate code during an office visit or telehealth encounter  Drug Induced Myopathy G72.0   Myopathy, unspecified G72.9   Myositis, unspecified M60.9   Rhabdomyolysis Y50.35   Alcoholic fatty liver W65.6   Cirrhosis of liver K74.69   Prediabetes R73.03   PCOS E28.2   Toxic liver disease, unspecified K71.9   Adverse effect of antihyperlipidemic and  antiarteriosclerotic drugs, initial encounter C12.7N1Z    Plan: Route note to PCP Follow up after the 10/09/20 appointment.  Valerie Lamb, PharmD, [place] Clinical Pharmacist [phone]

## 2020-10-09 ENCOUNTER — Other Ambulatory Visit: Payer: Self-pay

## 2020-10-09 ENCOUNTER — Encounter: Payer: Self-pay | Admitting: Family Medicine

## 2020-10-09 ENCOUNTER — Ambulatory Visit (INDEPENDENT_AMBULATORY_CARE_PROVIDER_SITE_OTHER): Payer: Medicare Other | Admitting: Family Medicine

## 2020-10-09 VITALS — BP 110/70 | HR 72 | Temp 97.0°F | Ht 67.0 in | Wt 244.5 lb

## 2020-10-09 DIAGNOSIS — E1169 Type 2 diabetes mellitus with other specified complication: Secondary | ICD-10-CM

## 2020-10-09 DIAGNOSIS — Z720 Tobacco use: Secondary | ICD-10-CM

## 2020-10-09 DIAGNOSIS — E1122 Type 2 diabetes mellitus with diabetic chronic kidney disease: Secondary | ICD-10-CM

## 2020-10-09 DIAGNOSIS — K219 Gastro-esophageal reflux disease without esophagitis: Secondary | ICD-10-CM

## 2020-10-09 DIAGNOSIS — E785 Hyperlipidemia, unspecified: Secondary | ICD-10-CM

## 2020-10-09 DIAGNOSIS — L309 Dermatitis, unspecified: Secondary | ICD-10-CM | POA: Insufficient documentation

## 2020-10-09 DIAGNOSIS — N1831 Chronic kidney disease, stage 3a: Secondary | ICD-10-CM

## 2020-10-09 DIAGNOSIS — R251 Tremor, unspecified: Secondary | ICD-10-CM

## 2020-10-09 LAB — POCT GLYCOSYLATED HEMOGLOBIN (HGB A1C): Hemoglobin A1C: 9.3 % — AB (ref 4.0–5.6)

## 2020-10-09 MED ORDER — ATENOLOL 25 MG PO TABS: 25.0000 mg | ORAL_TABLET | Freq: Every day | ORAL | 3 refills | Status: DC

## 2020-10-09 MED ORDER — LOVASTATIN 40 MG PO TABS: 40.0000 mg | ORAL_TABLET | Freq: Every day | ORAL | 3 refills | Status: DC

## 2020-10-09 MED ORDER — EMPAGLIFLOZIN 10 MG PO TABS: 10.0000 mg | ORAL_TABLET | Freq: Every day | ORAL | 0 refills | Status: DC

## 2020-10-09 MED ORDER — TRIAMCINOLONE ACETONIDE 0.5 % EX OINT: 1.0000 | TOPICAL_OINTMENT | Freq: Two times a day (BID) | CUTANEOUS | 0 refills | Status: DC

## 2020-10-09 NOTE — Assessment & Plan Note (Signed)
Advised quitting smoking. Suspect this could be contributing to the tickle in throat. Advised course of allergy pills.

## 2020-10-09 NOTE — Assessment & Plan Note (Addendum)
Getting symptoms if she decreasing the Omeprazole 40 mg bid. Discussed referral to GI to assess why she is unable to reduce dose. May be diet/medication related.

## 2020-10-09 NOTE — Assessment & Plan Note (Signed)
Use ointment and if not improving return. Limit to <1 week on face

## 2020-10-09 NOTE — Assessment & Plan Note (Signed)
Improvement with propranolol but discussing risk with copd/asthma will switch to atenolol for tremor control. Start atenolol 25 mg and increase as tolerated for symptom control.

## 2020-10-09 NOTE — Progress Notes (Signed)
Subjective:     Valerie Lamb is a 65 y.o. female presenting for Follow-up (3 month- DM )     HPI  #Diabetes Currently taking glipizide, trulicity, and actos  Using medications without difficulties: No - does get some nausea after trulicity dose Hypoglycemic episodes:No  Hyperglycemic episodes:No  Feet problems:continues to have stabbing pain in feet/legs arms Blood Sugars averaging: has not been checking Last HgbA1c:  Lab Results  Component Value Date   HGBA1C 9.3 (A) 10/09/2020   Diet: low appetite, eating once per day, has lost 14 lbs on trulicity  Diabetes Health Maintenance Due:    Diabetes Health Maintenance Due  Topic Date Due  . FOOT EXAM  Never done  . OPHTHALMOLOGY EXAM  Never done  . URINE MICROALBUMIN  Never done  . HEMOGLOBIN A1C  04/11/2021   Has been taking her lovastatin 40 mg   Notes some eczema not responding to hydrocortisone  Also itchy throat  Review of Systems  08/13/2020: Clinic - pre-op clearance - DM poorly controlled. Increase trulicity. Cont actos and glipizide  Social History   Tobacco Use  Smoking Status Current Every Day Smoker  . Packs/day: 0.25  . Years: 46.00  . Pack years: 11.50  . Types: Cigarettes  . Start date: 1973  Smokeless Tobacco Never Used  Tobacco Comment   2 ppd previously, cut back 2021        Objective:    BP Readings from Last 3 Encounters:  10/09/20 110/70  08/23/20 (!) (P) 105/56  08/13/20 110/70   Wt Readings from Last 3 Encounters:  10/09/20 244 lb 8 oz (110.9 kg)  08/29/20 258 lb (117 kg)  08/23/20 257 lb 15.7 oz (117 kg)    BP 110/70   Pulse 72   Temp (!) 97 F (36.1 C) (Temporal)   Ht 5\' 7"  (1.702 m)   Wt 244 lb 8 oz (110.9 kg)   SpO2 94%   BMI 38.29 kg/m    Physical Exam Constitutional:      General: She is not in acute distress.    Appearance: She is well-developed. She is not diaphoretic.  HENT:     Right Ear: External ear normal.     Left Ear: External ear normal.   Eyes:     Conjunctiva/sclera: Conjunctivae normal.  Cardiovascular:     Rate and Rhythm: Normal rate.  Pulmonary:     Effort: Pulmonary effort is normal.  Musculoskeletal:     Cervical back: Neck supple.  Skin:    General: Skin is warm and dry.     Capillary Refill: Capillary refill takes less than 2 seconds.     Comments: Scaly skin on right eyebrow and right ankle  Neurological:     Mental Status: She is alert. Mental status is at baseline.  Psychiatric:        Mood and Affect: Mood normal.        Behavior: Behavior normal.           Assessment & Plan:   Problem List Items Addressed This Visit      Digestive   GERD (gastroesophageal reflux disease)    Getting symptoms if she decreasing the Omeprazole 40 mg bid. Discussed referral to GI to assess why she is unable to reduce dose. May be diet/medication related.       Relevant Orders   Ambulatory referral to Gastroenterology     Endocrine   Type 2 diabetes mellitus with diabetic chronic kidney disease ([place]) -  Primary    Lab Results  Component Value Date   HGBA1C 9.3 (A) 10/09/2020  Improved but not at goal. Will refer to pharmacist for additional support. Some nausea on days of trulicity so will continue 1.5 mg dose w/o increase. Cont actos 30 mg and glipizide 10 mg. Start Jardiance 10 mg due to CKD. Encourage diabetic diet, smoking cession.        Relevant Medications   empagliflozin (JARDIANCE) 10 MG TABS tablet   lovastatin (MEVACOR) 40 MG tablet   Other Relevant Orders   POCT glycosylated hemoglobin (Hb A1C) (Completed)   Hyperlipidemia associated with type 2 diabetes mellitus ([place])    Lab Results  Component Value Date   LDLCALC 103 (H) 07/12/2020  Not at goal. Encouraged continuing lovastatin and refill provided. If not at goal with diabetes control will consider dose adjustment       Relevant Medications   empagliflozin (JARDIANCE) 10 MG TABS tablet   lovastatin (MEVACOR) 40 MG tablet      Musculoskeletal and Integument   Eczema    Use ointment and if not improving return. Limit to <1 week on face      Relevant Medications   triamcinolone ointment (KENALOG) 0.5 %     Other   Tobacco abuse    Advised quitting smoking. Suspect this could be contributing to the tickle in throat. Advised course of allergy pills.       Tremor    Improvement with propranolol but discussing risk with copd/asthma will switch to atenolol for tremor control. Start atenolol 25 mg and increase as tolerated for symptom control.       Relevant Medications   atenolol (TENORMIN) 25 MG tablet       Return in about 3 months (around 01/09/2021) for Diabetes .  Lesleigh Noe, MD  This visit occurred during the SARS-CoV-2 public health emergency.  Safety protocols were in place, including screening questions prior to the visit, additional usage of staff PPE, and extensive cleaning of exam room while observing appropriate contact time as indicated for disinfecting solutions.

## 2020-10-09 NOTE — Assessment & Plan Note (Signed)
Lab Results  Component Value Date   LDLCALC 103 (H) 07/12/2020  Not at goal. Encouraged continuing lovastatin and refill provided. If not at goal with diabetes control will consider dose adjustment

## 2020-10-09 NOTE — Addendum Note (Signed)
Addended by: Waunita Schooner R on: 10/09/2020 12:13 PM   Modules accepted: Orders

## 2020-10-09 NOTE — Assessment & Plan Note (Signed)
Lab Results  Component Value Date   HGBA1C 9.3 (A) 10/09/2020  Improved but not at goal. Will refer to pharmacist for additional support. Some nausea on days of trulicity so will continue 1.5 mg dose w/o increase. Cont actos 30 mg and glipizide 10 mg. Start Jardiance 10 mg due to CKD. Encourage diabetic diet, smoking cession.

## 2020-10-09 NOTE — Patient Instructions (Addendum)
#  Diabetes - continue current medication - start Jardiance 10 mg - I will check and see if you are eligible for meeting with the pharmacist  #Tremor - stop propranolol - start atenolol 25 mg after 2 days if no long dizziness and still tremor can increase to 50 mg  #throat - daily allergy pill - claritin, zyrtec, allegra - store brand OK   #Eczema - triamcinolone ointment

## 2020-10-11 ENCOUNTER — Telehealth: Payer: Self-pay | Admitting: Family Medicine

## 2020-10-11 NOTE — Chronic Care Management (AMB) (Signed)
  Chronic Care Management   Note  10/11/2020 Name: Valerie Lamb MRN: [id number] DOB: November 28, 1955  Valerie Lamb is a 65 y.o. year old female who is a primary care patient of Einar Pheasant Jobe Marker, MD. I reached out to Antony Odea by phone today in response to a referral sent by Valerie Lamb PCP, Lesleigh Noe, MD.   Valerie Lamb was given information about Chronic Care Management services today including:  1. CCM service includes personalized support from designated clinical staff supervised by her physician, including individualized plan of care and coordination with other care providers 2. 24/7 contact phone numbers for assistance for urgent and routine care needs. 3. Service will only be billed when office clinical staff spend 20 minutes or more in a month to coordinate care. 4. Only one practitioner may furnish and bill the service in a calendar month. 5. The patient may stop CCM services at any time (effective at the end of the month) by phone call to the office staff.   Patient agreed to services and verbal consent obtained.   Follow up plan:   Lauretta Grill [employer] Scheduler

## 2020-10-15 ENCOUNTER — Other Ambulatory Visit: Payer: Self-pay

## 2020-10-15 ENCOUNTER — Encounter: Payer: Self-pay | Admitting: Obstetrics & Gynecology

## 2020-10-15 ENCOUNTER — Telehealth: Payer: Self-pay | Admitting: Family Medicine

## 2020-10-15 ENCOUNTER — Ambulatory Visit (INDEPENDENT_AMBULATORY_CARE_PROVIDER_SITE_OTHER): Payer: Medicare Other | Admitting: Obstetrics & Gynecology

## 2020-10-15 VITALS — BP 125/80 | HR 68 | Ht 67.0 in | Wt 240.0 lb

## 2020-10-15 DIAGNOSIS — N8501 Benign endometrial hyperplasia: Secondary | ICD-10-CM

## 2020-10-15 DIAGNOSIS — E1159 Type 2 diabetes mellitus with other circulatory complications: Secondary | ICD-10-CM

## 2020-10-15 DIAGNOSIS — E1122 Type 2 diabetes mellitus with diabetic chronic kidney disease: Secondary | ICD-10-CM | POA: Diagnosis not present

## 2020-10-15 DIAGNOSIS — I152 Hypertension secondary to endocrine disorders: Secondary | ICD-10-CM

## 2020-10-15 DIAGNOSIS — N950 Postmenopausal bleeding: Secondary | ICD-10-CM

## 2020-10-15 NOTE — Patient Instructions (Signed)
Needed: [ ]  Surgical Clearance from PCP especially given poor control of DM, HTN and smoking     Vaginal Hysterectomy  A vaginal hysterectomy is a procedure to remove all or part of the uterus through a small incision in the vagina. In this procedure, your health care provider may remove your entire uterus, including the cervix. The cervix is the opening and bottom part of the uterus and is located between the vagina and the uterus. Sometimes, the ovaries and fallopian tubes are also removed. This surgery may be done to treat problems such as:  Noncancerous growths in the uterus (uterine fibroids) that cause symptoms.  A condition that causes the lining of the uterus to grow in other areas (endometriosis).  Problems with pelvic support.  Cancer of the cervix, ovaries, uterus, or tissue that lines the uterus (endometrium).  Excessive bleeding in the uterus. When removing your uterus, your health care provider may also remove the ovaries and the fallopian tubes. After this procedure, you will no longer be able to have a baby, and you will no longer have a menstrual period. Tell a health care provider about:  Any allergies you have.  All medicines you are taking, including vitamins, herbs, eye drops, creams, and over-the-counter medicines.  Any problems you or family members have had with anesthetic medicines.  Any blood disorders you have.  Any surgeries you have had.  Any medical conditions you have.  Whether you are pregnant or may be pregnant. What are the risks? Generally, this is a safe procedure. However, problems may occur, including:  Bleeding.  Infection.  Blood clots in the legs or lungs.  Damage to nearby structures or organs.  Pain during sex.  Allergic reactions to medicines. What happens before the procedure? Staying hydrated Follow instructions from your health care provider about hydration, which may include:  Up to 2 hours before the procedure -  you may continue to drink clear liquids, such as water, clear fruit juice, black coffee, and plain tea.   Eating and drinking restrictions Follow instructions from your health care provider about eating and drinking, which may include:  8 hours before the procedure - stop eating heavy meals or foods, such as meat, fried foods, or fatty foods.  6 hours before the procedure - stop eating light meals or foods, such as toast or cereal.  6 hours before the procedure - stop drinking milk or drinks that contain milk.  2 hours before the procedure - stop drinking clear liquids. Medicines  Ask your health care provider about: ? Changing or stopping your regular medicines. This is especially important if you are taking diabetes medicines or blood thinners. ? Taking medicines such as aspirin and ibuprofen. These medicines can thin your blood. Do not take these medicines unless your health care provider tells you to take them. ? Taking over-the-counter medicines, vitamins, herbs, and supplements.  You may be asked to take a medicine to empty your colon (bowel preparation). General instructions  If you were asked to do a bowel preparation before the procedure, follow instructions from your health care provider.  This procedure can affect the way you feel about yourself. Talk with your health care provider about the physical and emotional changes hysterectomy may cause.  Do not use any products that contain nicotine or tobacco for at least 4 weeks before the procedure. These products include cigarettes, e-cigarettes, and chewing tobacco. If you need help quitting, ask your health care provider.  Plan to have a responsible  adult take you home from the hospital or clinic.  Plan to have a responsible adult care for you for the time you are told after you leave the hospital or clinic. This is important. Surgery safety Ask your health care provider:  How your surgery site will be marked.  What steps  will be taken to help prevent infection. These may include: ? Removing hair at the surgery site. ? Washing skin with a germ-killing soap. ? Receiving antibiotic medicine. What happens during the procedure?  An IV will be inserted into one of your veins.  You will be given one or more of the following: ? A medicine to help you relax (sedative). ? A medicine to numb the area (local anesthetic). ? A medicine to make you fall asleep (general anesthetic). ? A medicine that is injected into your spine to numb the area below and slightly above the injection site (spinal anesthetic). ? A medicine that is injected into an area of your body to numb everything below the injection site (regional anesthetic).  Your surgeon will make an incision in your vagina.  Your surgeon will locate and remove all or part of your uterus. Part or all of the uterus will be removed through the vagina.  Your ovaries and fallopian tubes may be removed at the same time.  The incision in your vagina will be closed with stitches (sutures) that dissolve over time. The procedure may vary among health care providers and hospitals. What happens after the procedure?  Your blood pressure, heart rate, breathing rate, and blood oxygen level will be monitored until you leave the hospital or clinic.  You will be encouraged to walk as soon as possible. You will also use a device or do breathing exercises to keep your lungs clear.  You may have to wear compression stockings. These stockings help to prevent blood clots and reduce swelling in your legs.  You will be given pain medicine as needed.  You will need to wear a sanitary pad for vaginal discharge or bleeding. Summary  A vaginal hysterectomy is a procedure to remove all or part of the uterus through the vagina.  You may need a vaginal hysterectomy to treat a variety of abnormalities of the uterus.  Plan to have a responsible adult take you home from the hospital or  clinic.  Plan to have a responsible adult care for you for the time you are told after you leave the hospital or clinic. This is important. This information is not intended to replace advice given to you by your health care provider. Make sure you discuss any questions you have with your health care provider. Document Revised: 01/20/2020 Document Reviewed: 01/20/2020 Elsevier Patient Education  [place].

## 2020-10-15 NOTE — [facility] Notes (Signed)
GYNECOLOGY OFFICE CONSULT NOTE  History:   Valerie Lamb is a 65 y.o. [phone] here today for hysterectomy consult for simple endometrial hyperplasia without atypia, discovered after being evaluated for months of postmenopausal bleeding.  History of poorly controlled T2 DM (recent A1C 9.3), HTN, obesity, OSA, COPD, 2ppd tobacco smoking. Patient was initially seen at [facility] [place] earlier this year and initial biopsy showed endometrial hyperplasia without atypia. Ultrasound revealed 9 week sized uterus, normal adnexa. Patient was started on Provera , hysteroscopy and D&C recommended. Due to events in her life, she was unable to get this done but followed up with Dr. Gilman Schmidt at [place] in 07/2020.  She underwent Hysteroscopy and D&C on 08/23/2020, this also showed simple endometrial hyperplasia without atypia.  Offered long term progestin therapy and surveillance endometrial biopsies, she adamantly declined this. Hysterectomy was recommended, patient reported she desired to undergo surgery here in [place].  Does not want to be operated on at [facility] because "they treated me badly" during her hysteroscopy, had similar complaint about office staff at [place], she still reports small amount of bleeding occasionally despite Provera. Of note, last hemoglobin was 16.0. Really desires hysterectomy here at [facility] in [place].  Reports working with her PCP to optimize her DM and HTN before surgery, they are talking about possibly starting her on insulin therapy. She denies any other concerns.    Past Medical History:  Diagnosis Date  . Anemia   . Anxiety   . COPD (chronic obstructive pulmonary disease) ([place])   . Depression   . Diabetes mellitus without complication ([place])   . Endometriosis   . Gastric ulcer   . Hypertension   . Sleep apnea   . Thyroid disease     Past Surgical History:  Procedure Laterality Date  . HYSTEROSCOPY WITH D & C N/A 08/23/2020   Procedure: DILATATION AND  CURETTAGE /HYSTEROSCOPY, polypectomy;  Surgeon: Homero Fellers, MD;  Location: [facility] ORS;  Service: Gynecology;  Laterality: N/A;  . TUBAL LIGATION      The following portions of the patient's history were reviewed and updated as appropriate: allergies, current medications, past family history, past medical history, past social history, past surgical history and problem list.   Health Maintenance:  ASCUS pap and negative HRHPV on 07/24/2020.  Normal mammogram a few years ago, ordered for one soon by Dr. Gilman Schmidt.  Review of Systems:  Pertinent items noted in HPI and remainder of comprehensive ROS otherwise negative.  Physical Exam:  BP 125/80   Pulse 68   Ht 5\' 7"  (1.702 m)   Wt 240 lb (108.9 kg)   BMI 37.59 kg/m  CONSTITUTIONAL: Well-developed, well-nourished female in no acute distress.  HEENT:  Normocephalic, atraumatic. External right and left ear normal. No scleral icterus.  NECK: Normal range of motion, supple, no masses noted on observation SKIN: No rash noted. Not diaphoretic. No erythema. No pallor. MUSCULOSKELETAL: Normal range of motion. No edema noted. NEUROLOGIC: Alert and oriented to person, place, and time. Normal muscle tone coordination. No cranial nerve deficit noted. PSYCHIATRIC: Normal mood and affect. Normal behavior. Normal judgment and thought content. CARDIOVASCULAR: Normal heart rate noted RESPIRATORY: Effort and breath sounds normal, no problems with respiration noted ABDOMEN: Obese, no masses noted. No other overt distention noted.   PELVIC: Deferred     Assessment and Plan:      1. Simple endometrial hyperplasia without atypia 2. Postmenopausal bleeding 3. Type 2 diabetes mellitus with chronic kidney disease, without  long-term current use of insulin, unspecified CKD stage ([place]) 4. Hypertension associated with diabetes ([place]) 5. Morbid Obesity ([place]) Concerned about patient's multiple co-morbidities and she is not an ideal surgical candidate.  Recommended progestin therapy (oral or IUD) with surveillance biopsies, she refused this again, desires definitive management. Discussed concerns about cardiovascular complications, VTE risks and wound healing risks with her co-morbidities.  Emphasized that her DM and overall health need to be optimized before major surgery is done.    Will discuss with Dr. Einar Pheasant (her PCP) as she needs preoperative clearance for major surgery and recommendations.  In the meantime, discussed details about proposed hysterectomy modality.  I proposed doing a total vaginal hysterectomy (TVH) and  bilateral salpingoophorectomy. The risks of surgery were discussed in detail with the patient including but not limited to: bleeding which may require transfusion or reoperation; infection which may require antibiotics or further interventions; injury to bowel, bladder, ureters or other surrounding organs; need for additional procedures including laparotomy or subsequent procedures secondary to abnormal pathology; formation of adhesions; wound healing issues, cardiovascular and thromboembolic phenomenon; incisional problems; death and other postoperative/anesthesia complications.  Patient was also advised that she will remain in house for 1-2 nights; and expected recovery time after a hysterectomy is 6-8 weeks.  Patient was told that the likelihood that her condition and symptoms will be treated effectively with this surgical management was very high; the postoperative expectations were also discussed in detail. The patient also understands the alternative treatment options which were discussed in full. All questions were answered.    Once I have discussed this with Dr. Einar Pheasant, will send order to surgical scheduler.  Patient was told that her operation may be about 2 or more months out, given our schedule and also to give time for optimization of her issues.   She was told that she will be contacted by our surgical scheduler regarding the time  and date of her surgery; routine preoperative instructions will be given to her by the preoperative nursing team.   She is aware of need for preoperative COVID testing and subsequent quarantine from time of test to time of surgery; she will be given further preoperative instructions at that [place] screening visit.   Routine postoperative instructions will be reviewed with the patient in detail after surgery.  In the meantime, she will continue Provera as prescribed; bleeding precautions were reviewed. Printed patient education handouts about the procedure was given to the patient to review at home.   Please refer to After Visit Summary for other counseling recommendations.   Return for any gynecologic concerns.    I spent 40 minutes dedicated to the care of this patient including pre-visit review of records, face to face time with the patient discussing her conditions and treatments and post visit ordering of testing, communication with her PCP about cardiac clearance.    Verita Schneiders, MD, [place] for [employer], [place]

## 2020-10-15 NOTE — Telephone Encounter (Signed)
Attempted to reach patient to schedule follow up per Dr. Einar Pheasant. Lvm asking her to call office

## 2020-10-23 ENCOUNTER — Encounter: Payer: Self-pay | Admitting: Family Medicine

## 2020-10-24 ENCOUNTER — Other Ambulatory Visit: Payer: Self-pay

## 2020-10-24 ENCOUNTER — Ambulatory Visit (INDEPENDENT_AMBULATORY_CARE_PROVIDER_SITE_OTHER): Payer: Medicare Other | Admitting: Family Medicine

## 2020-10-24 VITALS — BP 100/62 | HR 68 | Temp 96.9°F | Ht 67.0 in | Wt 241.2 lb

## 2020-10-24 DIAGNOSIS — N8501 Benign endometrial hyperplasia: Secondary | ICD-10-CM | POA: Diagnosis not present

## 2020-10-24 DIAGNOSIS — E785 Hyperlipidemia, unspecified: Secondary | ICD-10-CM | POA: Diagnosis not present

## 2020-10-24 DIAGNOSIS — E1169 Type 2 diabetes mellitus with other specified complication: Secondary | ICD-10-CM

## 2020-10-24 DIAGNOSIS — E1122 Type 2 diabetes mellitus with diabetic chronic kidney disease: Secondary | ICD-10-CM

## 2020-10-24 DIAGNOSIS — R251 Tremor, unspecified: Secondary | ICD-10-CM | POA: Diagnosis not present

## 2020-10-24 LAB — MICROALBUMIN / CREATININE URINE RATIO
Creatinine,U: 169 mg/dL
Microalb Creat Ratio: 2.8 mg/g (ref 0.0–30.0)
Microalb, Ur: 4.7 mg/dL — ABNORMAL HIGH (ref 0.0–1.9)

## 2020-10-24 MED ORDER — LANTUS SOLOSTAR 100 UNIT/ML ~~LOC~~ SOPN: 10.0000 [IU] | PEN_INJECTOR | Freq: Every day | SUBCUTANEOUS | 1 refills | Status: DC

## 2020-10-24 NOTE — Assessment & Plan Note (Signed)
Still with mild tremor, however, BP is low and notes occasionally lightheadedness. Discussed given significant improvement will continue current dose of atenolol 25 mg.

## 2020-10-24 NOTE — Patient Instructions (Addendum)
#  Checking Blood sugar - Start Lantus 10 units - Check sugars Goal for fasting <140 - Goal for 2 hours after a meal is <180  Stop Actos  Continue  - Glipizide 10 mg - Jardiance 10 mg (we may increase in this 2 weeks) - Trulicity 1.5 mg - Lantus 10 units to start, we will check in weekly and increase as needed  #Referral I have placed a referral to a specialist for you. You should receive a phone call from the specialty office. Make sure your voicemail is not full and that if you are able to answer your phone to unknown or new numbers.   It may take up to 2 weeks to hear about the referral. If you do not hear anything in 2 weeks, please call our office and ask to speak with the referral coordinator.

## 2020-10-24 NOTE — [facility] Notes (Signed)
Subjective:     Valerie Lamb is a 65 y.o. female presenting for Follow-up (DM )     HPI  #endometrial hyperplasia - planning hysterectomy - in 8-10 weeks -   #Diabetes Currently taking actos 30 mg, glipizide 10 mg, jardiance 10 mg, Trulicity 1.5 mg  Using medications without difficulties: No  - getting nausea with trulicity Hypoglycemic episodes:No  Hyperglycemic episodes:No  Feet problems:occasional pain  Last HgbA1c:  Lab Results  Component Value Date   HGBA1C 9.3 (A) 10/09/2020    Diabetes Health Maintenance Due:    Diabetes Health Maintenance Due  Topic Date Due  . OPHTHALMOLOGY EXAM  Never done  . URINE MICROALBUMIN  Never done  . HEMOGLOBIN A1C  04/11/2021  . FOOT EXAM  10/24/2021   #Tremors - endorses some lightheadedness and low bp - the tremor is mostly gone  - sometimes still has issues with fine motor due to the tremor   Review of Systems   Social History   Tobacco Use  Smoking Status Current Every Day Smoker  . Packs/day: 0.25  . Years: 46.00  . Pack years: 11.50  . Types: Cigarettes  . Start date: 1973  Smokeless Tobacco Never Used  Tobacco Comment   2 ppd previously, cut back 2021        Objective:    BP Readings from Last 3 Encounters:  10/24/20 100/62  10/15/20 125/80  10/09/20 110/70   Wt Readings from Last 3 Encounters:  10/24/20 241 lb 4 oz (109.4 kg)  10/15/20 240 lb (108.9 kg)  10/09/20 244 lb 8 oz (110.9 kg)    BP 100/62   Pulse 68   Temp (!) 96.9 F (36.1 C) (Temporal)   Ht 5\' 7"  (1.702 m)   Wt 241 lb 4 oz (109.4 kg)   SpO2 98%   BMI 37.79 kg/m    Physical Exam Constitutional:      General: She is not in acute distress.    Appearance: She is well-developed. She is not diaphoretic.  HENT:     Right Ear: External ear normal.     Left Ear: External ear normal.     Nose: Nose normal.  Eyes:     Conjunctiva/sclera: Conjunctivae normal.  Cardiovascular:     Rate and Rhythm: Normal rate and regular  rhythm.     Heart sounds: No murmur heard.   Pulmonary:     Effort: Pulmonary effort is normal. No respiratory distress.     Breath sounds: Normal breath sounds. No wheezing.  Musculoskeletal:     Cervical back: Neck supple.  Skin:    General: Skin is warm and dry.     Capillary Refill: Capillary refill takes less than 2 seconds.  Neurological:     Mental Status: She is alert. Mental status is at baseline.  Psychiatric:        Mood and Affect: Mood normal.        Behavior: Behavior normal.           Assessment & Plan:   Problem List Items Addressed This Visit      Endocrine   Type 2 diabetes mellitus with diabetic chronic kidney disease ([place]) - Primary    Poorly controlled, but improving. Unable to tolerate higher dose of Trulicity 2/2 nausea. Cont glipizide 10 mg. Cont Jardiance 10 mg (but may increase in a few weeks). Start Lantus 10 units. Continue CBG monitoring and update next week for possible dose increase.  Relevant Medications   insulin glargine (LANTUS SOLOSTAR) 100 UNIT/ML Solostar Pen   Other Relevant Orders   Ambulatory referral to Optometry   Microalbumin / creatinine urine ratio   Hyperlipidemia associated with type 2 diabetes mellitus ([place])    Will recheck at next visit. Cont lovastatin 40 mg.      Relevant Medications   insulin glargine (LANTUS SOLOSTAR) 100 UNIT/ML Solostar Pen     Genitourinary   Endometrial hyperplasia without atypia, simple    Following with OB/GYN and planning hysterectomy. Appreciate GYN support - will work on controlling diabetes with insulin.         Other   Tremor    Still with mild tremor, however, BP is low and notes occasionally lightheadedness. Discussed given significant improvement will continue current dose of atenolol 25 mg.           Return in about 11 weeks (around 01/09/2021) for Diabetes .  Lesleigh Noe, MD  This visit occurred during the SARS-CoV-2 public health emergency.  Safety protocols  were in place, including screening questions prior to the visit, additional usage of staff PPE, and extensive cleaning of exam room while observing appropriate contact time as indicated for disinfecting solutions.

## 2020-10-24 NOTE — Assessment & Plan Note (Signed)
Following with OB/GYN and planning hysterectomy. Appreciate GYN support - will work on controlling diabetes with insulin.

## 2020-10-24 NOTE — Assessment & Plan Note (Signed)
Poorly controlled, but improving. Unable to tolerate higher dose of Trulicity 2/2 nausea. Cont glipizide 10 mg. Cont Jardiance 10 mg (but may increase in a few weeks). Start Lantus 10 units. Continue CBG monitoring and update next week for possible dose increase.

## 2020-10-24 NOTE — Assessment & Plan Note (Signed)
Will recheck at next visit. Cont lovastatin 40 mg.

## 2020-10-25 ENCOUNTER — Encounter: Payer: Self-pay | Admitting: Family Medicine

## 2020-10-25 MED ORDER — PEN NEEDLES 31G X 8 MM MISC: 3 refills | Status: DC

## 2020-10-25 NOTE — Telephone Encounter (Signed)
Needles have been sent in for the patient

## 2020-10-26 ENCOUNTER — Other Ambulatory Visit: Payer: Self-pay

## 2020-10-26 MED ORDER — INSULIN PEN NEEDLE 31G X 5 MM MISC: 3 refills | Status: DC

## 2020-10-28 ENCOUNTER — Other Ambulatory Visit: Payer: Self-pay | Admitting: Family Medicine

## 2020-10-28 DIAGNOSIS — E1169 Type 2 diabetes mellitus with other specified complication: Secondary | ICD-10-CM

## 2020-10-30 ENCOUNTER — Encounter: Payer: Self-pay | Admitting: Family Medicine

## 2020-10-31 ENCOUNTER — Other Ambulatory Visit: Payer: Self-pay

## 2020-10-31 ENCOUNTER — Telehealth: Payer: Self-pay

## 2020-10-31 ENCOUNTER — Ambulatory Visit (INDEPENDENT_AMBULATORY_CARE_PROVIDER_SITE_OTHER): Payer: Medicare Other

## 2020-10-31 DIAGNOSIS — E1122 Type 2 diabetes mellitus with diabetic chronic kidney disease: Secondary | ICD-10-CM | POA: Diagnosis not present

## 2020-10-31 DIAGNOSIS — E1169 Type 2 diabetes mellitus with other specified complication: Secondary | ICD-10-CM

## 2020-10-31 DIAGNOSIS — I152 Hypertension secondary to endocrine disorders: Secondary | ICD-10-CM

## 2020-10-31 DIAGNOSIS — E785 Hyperlipidemia, unspecified: Secondary | ICD-10-CM | POA: Diagnosis not present

## 2020-10-31 DIAGNOSIS — E1159 Type 2 diabetes mellitus with other circulatory complications: Secondary | ICD-10-CM | POA: Diagnosis not present
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
## 2020-10-31 NOTE — Chronic Care Management (AMB) (Addendum)
Chronic Care Management Pharmacy Assistant   Name: Valerie Lamb  MRN: [id number] DOB: Oct 04, 1955  Valerie Lamb is an 66 y.o. year old female who presents for her initial CCM visit with the clinical pharmacist.   Conditions to be addressed/monitored: HTN, HLD, COPD and DMII  Recent office visits:  10/24/20-Dr.Cody PCP started lantus 100unit/ml 10 units check in a week referral to specialist 10/09/20-PCP start atenolol 25mg  take 1 tablet daily,stop propanolol start Jardiance 10mg  1 tablet before breakfast, added triamcinolone 0.5% topical cream,apply 2 times daily  08/13/20-Dr.Cody PCP-Increase Trulicity 3.66/4.4 to 3mg  weekly follow up 2 months 07/12/20-Dr.Cody- added propanolol 40mg  1 tablet 2 times daily  Neurology referral 06/25/20-Dr.Cody telemedicine added alprazolam 0.25 1 tablet daily prn 05/07/20-Dr.Cody labs ordered added bupropion 150mg  1 tablet daily for 3 days then increase to 2 times daily follow up 6 weeks  Recent consult visits:  10/15/20-OB-GYN no medication changes 08/23/20-[facility] started acetaminopen 1000mg  take 1 tablet every 6 hours prn pain  08/29/20-OB-GYN no medication changes 08/16/20-OB-GYN no medication changes 07/04/20-OB-GYN no medication changes 06/12/20-[facility]  Pre-op labs ordered,ordered provera 10mg  1 tablet daily  Hospital visits:  08/23/20-[facility]-OB-GYN discharged with Acetamiophen 1000mg  1 tablet every 6 hours prn pain  Medications: Outpatient Encounter Medications as of 10/31/2020  Medication Sig   acetaminophen (TYLENOL) 500 MG tablet Take 2 tablets (1,000 mg total) by mouth every 6 (six) hours as needed.   albuterol (VENTOLIN HFA) 108 (90 Base) MCG/ACT inhaler Inhale 1-2 puffs into the lungs every 6 (six) hours as needed for shortness of breath or wheezing.   ALPRAZolam (XANAX) 0.25 MG tablet Take 1 tablet (0.25 mg total) by mouth daily as needed for anxiety.   atenolol (TENORMIN) 25 MG tablet Take 1 tablet (25 mg total) by mouth daily.    empagliflozin (JARDIANCE) 10 MG TABS tablet Take 1 tablet (10 mg total) by mouth daily before breakfast.   glipiZIDE (GLUCOTROL) 10 MG tablet Take 1 tablet (10 mg total) by mouth daily.   hydrochlorothiazide (HYDRODIURIL) 25 MG tablet Take 25 mg by mouth daily.   ibuprofen (ADVIL) 800 MG tablet Take 800 mg by mouth every 8 (eight) hours as needed for moderate pain.   insulin glargine (LANTUS SOLOSTAR) 100 UNIT/ML Solostar Pen Inject 10 Units into the skin daily.   Insulin Pen Needle 31G X 5 MM MISC Use once daily to inject 10 units   levothyroxine (SYNTHROID) 100 MCG tablet Take 100 mcg by mouth daily before breakfast.   loratadine (CLARITIN) 10 MG tablet Take 10 mg by mouth daily.   lovastatin (MEVACOR) 40 MG tablet Take 1 tablet (40 mg total) by mouth at bedtime.   medroxyPROGESTERone (PROVERA) 10 MG tablet Take 1 tablet (10 mg total) by mouth in the morning and at bedtime.   melatonin 5 MG TABS Take 5 mg by mouth at bedtime as needed (sleep).   omeprazole (PRILOSEC) 40 MG capsule Take 1 capsule (40 mg total) by mouth 2 (two) times daily.   Tiotropium Bromide Monohydrate 2.5 MCG/ACT AERS Inhale 2 puffs into the lungs daily.    triamcinolone ointment (KENALOG) 0.5 % Apply 1 application topically 2 (two) times daily.   TRULICITY 1.5 IH/4.7QQ SOPN INJECT 1.5 MG INTO THE SKIN ONCE A WEEK.   No facility-administered encounter medications on file as of 10/31/2020.     Lab Results  Component Value Date/Time   HGBA1C 9.3 (A) 10/09/2020 09:59 AM   HGBA1C 10.2 (H) 07/12/2020 12:40 PM   MICROALBUR 4.7 (H) 10/24/2020 11:07  AM     BP Readings from Last 3 Encounters:  10/24/20 100/62  10/15/20 125/80  10/09/20 110/70   Have you seen any other providers since your last visit with PCP? No Any changes in your medications or health? No Any side effects from any medications? No Do you have an symptoms or problems not managed by your medications? No Any concerns about your health right now? Yes the  patient reports her hair and fingernails are very brittle and she knows the thyroid medication may be doing this  Has your provider asked that you check blood pressure, blood sugar, or follow special diet at home? Yes she does not take BP regularly but she does take BG's and yesterday 10/30/20 fasting was 187. She limits her sweets and carbs Do you get any type of exercise on a regular basis? Yes she has a 100lb dog she has to walk routinely, she has been busy cleaning out since her husband passed away 12-Jul-2020 Can you think of a goal you would like to reach for your health? Yes better nails and hair Do you have any problems getting your medications? Yes the patient stated that the pharmacy ([facility]) has been out of stock on occasion of her trulicity and jardiance  Is there anything that you would like to discuss during the appointment? No   Valerie Lamb was reminded to have all medications, supplements and any blood glucose and blood pressure readings available for review with Debbora Dus, Pharm. D, at her telephone visit on 10/31/2020 at 2:00pm    Star Rating Drugs:  Medication:  Last Fill: Day Supply Jardiance 10mg  10/12/20 90 Lantus 100 unit/ml 10/24/20 150 Lovastatin 40mg  09/24/93 90  trulicity 1.5mg /0.14ml 10/02/20  28     Follow-Up:  Pharmacist Review  Debbora Dus, CPP notified  Avel Sensor [facility] Clinical Pharmacy Assistant [phone]  I have reviewed the care management and care coordination activities outlined in this encounter and I am certifying that I agree with the content of this note. No further action required.  Debbora Dus, PharmD Clinical Pharmacist [facility] [phone]
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
## 2020-11-02 ENCOUNTER — Ambulatory Visit: Payer: Medicare Other | Admitting: Nurse Practitioner

## 2020-11-08 ENCOUNTER — Encounter: Payer: Self-pay | Admitting: Family Medicine

## 2020-11-12 ENCOUNTER — Telehealth: Payer: Self-pay

## 2020-11-12 NOTE — Progress Notes (Addendum)
See telephone note 11/12/20

## 2020-11-14 ENCOUNTER — Encounter: Payer: Self-pay | Admitting: Family Medicine

## 2020-11-16 ENCOUNTER — Telehealth: Payer: Self-pay

## 2020-11-16 NOTE — Progress Notes (Addendum)
See telephone note 11/16/20

## 2020-11-19 NOTE — Telephone Encounter (Signed)
11/16/20 spoke with the patient and she reports the following BG readings:  Fasting BG   11/14/20- 270                      11/15/20- 258                      11/16/20- 180  2 hrs after meal  11/13/20- 289    The patient did tell me that she missed her Wednesday dose of trulilcity, explained importance of not missing doses. Confirms the following medications are current: Jardiance 10 mg - 1 tablet daily Glipizide 10 mg - 1 tablet daily Lantus - 16 units daily in the morning Trulicity 1.5 mg - Inject once weekly (Wednesday)   Debbora Dus, CPP notified   CPA: 20 min Avel Sensor, [place] Assistant [phone]  I have reviewed the care management and care coordination activities outlined in this encounter and I am certifying that I agree with the content of this note. Attempted to contact patient to discuss high BG readings/missed Trulicity. Left VM with contact info on 11/19/20 at 12:30 PM.  Debbora Dus, PharmD Clinical Pharmacist [facility] at [facility] [phone]

## 2020-11-19 NOTE — Telephone Encounter (Signed)
11/12/2020  BG log       Fasting:  11/09/20- 167                    11/10/20- 176                    11/11/20- 206                    11/12/20- 224  Patient confirms taking Lantus 14 units every morning. Per chart note by Allie Bossier NP the patient will increase 2 more units tomorrow to 16 units each am due to fasting BG > 150.   Debbora Dus, CPP notified  CPA: 10 mins Avel Sensor, [place] Assistant [phone]  I have reviewed the care management and care coordination activities outlined in this encounter and I am certifying that I agree with the content of this note. No further action required.  Debbora Dus, PharmD Clinical Pharmacist [facility] at [facility] [phone]

## 2020-11-21 ENCOUNTER — Encounter: Payer: Self-pay | Admitting: Family Medicine

## 2020-11-21 DIAGNOSIS — E1122 Type 2 diabetes mellitus with diabetic chronic kidney disease: Secondary | ICD-10-CM

## 2020-11-21 NOTE — Telephone Encounter (Signed)
Routing to pharmacist as Juluis Rainier and for next week check in

## 2020-11-23 NOTE — Addendum Note (Signed)
Addended by: Pleas Koch on: 11/23/2020 01:45 PM   Modules accepted: Orders

## 2020-11-27 ENCOUNTER — Encounter: Payer: Self-pay | Admitting: Family Medicine

## 2020-11-27 ENCOUNTER — Telehealth: Payer: Self-pay | Admitting: *Deleted

## 2020-11-27 ENCOUNTER — Telehealth: Payer: Self-pay | Admitting: Obstetrics & Gynecology

## 2020-11-27 NOTE — Telephone Encounter (Signed)
Call to patient to review options for surgery (see previous phone message from Dr Harolyn Rutherford). Left message to call back to [phone]. No patient identifiers left on voice mail.

## 2020-11-27 NOTE — Telephone Encounter (Signed)
     Faculty Practice OB/GYN Physician Phone Call Documentation  I called Marquesha Robideau to discuss her plans for possible surgical intervention as outlined during our visit on 10/15/2020. Unfortunately, the call went to voicemail.  At our prior visit, patient was told that her glycemic control has to improve before surgery is considered, recommended medical management with progestin therapy instead which she adamantly declined.   I have reviewed her chart several time and it does not appear her glycemic control is improving.  She also has other concerning co-morbidities.  As such, I am no longer comfortable offering her surgical intervention.  If patient still desires surgical intervention, she can be seen by another surgeon in our group.  I still feel she can have progestin therapy (progestin IUD preferred) in the interim, as her simple endometrial hyperplasia is currently being untreated as she waits for surgical intervention.    Please call patient and let her know of my recommendations.  I do hope her glycemic control improves and she gets treatment soon for her hyperplasia.   Verita Schneiders, MD, [place] for [employer], [place]

## 2020-11-28 ENCOUNTER — Telehealth: Payer: Self-pay | Admitting: *Deleted

## 2020-11-28 NOTE — Telephone Encounter (Signed)
Received a voicemail from this am stating she is returning a call from yesterday.  Per chart review is a patient in [place] and received a call from Lamont Snowball and Dr. Harolyn Rutherford.  Will forward to [place] and [place] . Hatley Henegar,RN

## 2020-11-28 NOTE — Telephone Encounter (Signed)
Patient left message returning call. Call back to patient. Message from Dr Harolyn Rutherford was received but could not hear all of it. Reviewed recommendations from Dr Cay Schillings and that HgbA1C needs to be closer to 7 for elective surgery. Patient states blood glucose is improving and highs are now in 180 range. Requests to consult with Dr Rip Harbour due to problems with bleeding. Advised will have [place] office call her to schedule appointment. She has my number if no response by next week.   Encounter closed.

## 2020-11-29 ENCOUNTER — Telehealth: Payer: Self-pay | Admitting: *Deleted

## 2020-11-29 NOTE — Telephone Encounter (Signed)
Voice mail from patient requesting call regarding surgery. Return call to patient. Additional questions regarding referral to another surgeon. Advised this is to discuss with another provider to optimize her surgical plan.  Encounter closed.

## 2020-11-30 ENCOUNTER — Encounter: Payer: Self-pay | Admitting: Family Medicine

## 2020-12-04 ENCOUNTER — Other Ambulatory Visit: Payer: Self-pay | Admitting: Family Medicine

## 2020-12-04 ENCOUNTER — Encounter: Payer: Self-pay | Admitting: Family Medicine

## 2020-12-04 NOTE — Telephone Encounter (Signed)
Responded to other message with increase plan

## 2020-12-05 ENCOUNTER — Ambulatory Visit: Payer: Medicare Other | Admitting: Family Medicine

## 2020-12-11 ENCOUNTER — Encounter: Payer: Self-pay | Admitting: Family Medicine

## 2020-12-11 ENCOUNTER — Other Ambulatory Visit: Payer: Self-pay

## 2020-12-11 MED ORDER — ACCU-CHEK GUIDE VI STRP: ORAL_STRIP | 3 refills | Status: DC

## 2020-12-13 ENCOUNTER — Other Ambulatory Visit: Payer: Self-pay

## 2020-12-13 ENCOUNTER — Encounter: Payer: Self-pay | Admitting: Family Medicine

## 2020-12-13 ENCOUNTER — Encounter: Payer: Self-pay | Admitting: Obstetrics and Gynecology

## 2020-12-13 ENCOUNTER — Institutional Professional Consult (permissible substitution): Payer: Medicare Other | Admitting: Obstetrics and Gynecology

## 2020-12-13 ENCOUNTER — Ambulatory Visit (INDEPENDENT_AMBULATORY_CARE_PROVIDER_SITE_OTHER): Payer: Medicare Other | Admitting: Obstetrics and Gynecology

## 2020-12-13 VITALS — BP 116/76 | HR 75 | Ht 67.0 in | Wt 232.0 lb

## 2020-12-13 DIAGNOSIS — N8501 Benign endometrial hyperplasia: Secondary | ICD-10-CM

## 2020-12-13 DIAGNOSIS — N950 Postmenopausal bleeding: Secondary | ICD-10-CM | POA: Diagnosis not present

## 2020-12-13 NOTE — Progress Notes (Signed)
GYN presents for Surgical Consult on Hysterectomy. Patient is postmenopausal but start having bleeding since 05/2020.

## 2020-12-13 NOTE — Progress Notes (Signed)
Ms Swiney presents for discussion of vaginal hysterectomy. See prior office notes. In brief, pt had PMB late last yr to early this year. U/S showed thicken endometrium. EMBX simple hyperplasia on polyp. D & C confirmed, simple hyperplasia with atypia. Pt has been counseled on her treatment opitions, including medical vs surgerical. Pt desires surgerical.   Pt is currently on Provera 10 mg daily.  CHTN, OSA and uncontrolled DM. PCP Waunita Schooner  TSVD x 4, largest 9# 3 oz   PE AF VSS Lungs clear Heart RRR Abd soft + BS GU nl EGBUS uterus < 10 weeks, mobile, slightly tender, no masses. Exam limited by pt habitus  A/P PMB        Simple endometrium hyperplasia with atypia in setting of endometrial polyp.        Tx options reviewed again with pt. Pt desires TVH with possible BSO. R/B/Post op care reviewed, Hysterectomy papers signed. Will obtain Pre OP clearance. Once obtained will schedule surgery. Pt is aware and has been instructed to continue with daily Provera. F/U with Post op appt. F/U GYN U/S ordered.

## 2020-12-13 NOTE — Patient Instructions (Signed)
Vaginal Hysterectomy, Care After The following information offers guidance on how to care for yourself after your procedure. Your health care provider may also give you more specific instructions. If you have problems or questions, contact your health careprovider. What can I expect after the procedure? After the procedure, it is common to have: Pain in the lower abdomen and vagina. Vaginal bleeding and discharge for up to 1 week. You will need to use a sanitary pad after this procedure. Difficulty having a bowel movement (constipation). Temporary problems emptying the bladder. Tiredness (fatigue). Poor appetite. Less interest in sex. Feelings of sadness or other emotions. If your ovaries were also removed, it is also common to have symptoms of menopause, such as hot flashes, night sweats, and lack of sleep (insomnia). Follow these instructions at home: Medicines  Take over-the-counter and prescription medicines only as told by your health care provider. Do not take aspirin or NSAIDs, such as ibuprofen. These medicines can cause bleeding. Ask your health care provider if the medicine prescribed to you: Requires you to avoid driving or using machinery. Can cause constipation. You may need to take these actions to prevent or treat constipation: Drink enough fluid to keep your urine pale yellow. Take over-the-counter or prescription medicines. Eat foods that are high in fiber, such as beans, whole grains, and fresh fruits and vegetables. Limit foods that are high in fat and processed sugars, such as fried or sweet foods.  Activity  Rest as told by your health care provider. Return to your normal activities as told by your health care provider. Ask your health care provider what activities are safe for you Avoid sitting for a long time without moving. Get up to take short walks every 1-2 hours. This is important to improve blood flow and breathing. Ask for help if you feel weak or  unsteady. Try to have someone home with you for 1-2 weeks to help you with everyday chores. Do not lift anything that is heavier than 10 lb (4.5 kg), or the limit that you are told, until your health care provider says that it is safe. If you were given a sedative during the procedure, it can affect you for several hours. Do not drive or operate machinery until your health care provider says that it is safe.  Lifestyle Do not use any products that contain nicotine or tobacco. These products include cigarettes, chewing tobacco, and vaping devices, such as e-cigarettes. These can delay healing after surgery. If you need help quitting, ask your health care provider. Do not drink alcohol until your health care provider approves. General instructions Do not douche, use tampons, or have sex for at least 6 weeks, or as told by your health care provider. If you struggle with physical or emotional changes after your procedure, speak with your health care provider or a therapist. The stitches inside your vagina will dissolve over time and do not need to be taken out. Do not take baths, swim, or use a hot tub until your health care provider approves. You may only be allowed to take showers for 2-3 weeks Wear compression stockings as told by your health care provider. These stockings help to prevent blood clots and reduce swelling in your legs. Keep all follow-up visits. This is important. Contact a health care provider if: Your pain medicine is not helping. You have a fever. You have nausea or vomiting that does not go away. You feel dizzy. You have blood, pus, or a bad-smelling discharge from your  vagina more than 1 week after the procedure. You continue to have trouble urinating 3-5 days after the procedure. Get help right away if: You have severe pain in your abdomen or back. You faint. You have heavy vaginal bleeding and blood clots, soaking through a sanitary pad in less than 1 hour. You have  chest pain or shortness of breath. You have pain, swelling, or redness in your leg. These symptoms may represent a serious problem that is an emergency. Do not wait to see if the symptoms will go away. Get medical help right away. Call your local emergency services (911 in the [place] Do not drive yourself to the hospital. Summary After the procedure, it is common to have pain, vaginal bleeding, constipation, temporary problems emptying your bladder, and feelings of sadness or other emotions. Take over-the-counter and prescription medicines only as told by your health care provider. Rest as told by your health care provider. Return to your normal activities as told by your health care provider. Contact a health care provider if your pain medicine is not helping, or you have a fever, dizziness, or trouble urinating several days after the procedure. Get help right away if you have severe pain in your abdomen or back, or if you faint, have heavy bleeding, or have chest pain or shortness of breath. This information is not intended to replace advice given to you by your health care provider. Make sure you discuss any questions you have with your healthcare provider. Document Revised: 01/20/2020 Document Reviewed: 01/20/2020 Elsevier Patient Education  2022 [employer]. Vaginal Hysterectomy  A vaginal hysterectomy is a procedure to remove all or part of the uterus through a small incision in the vagina. In this procedure, your health care provider may remove your entire uterus, including the cervix. The cervix is the opening and bottom part of the uterus and is located between the vagina and theuterus. Sometimes, the ovaries and fallopian tubes are also removed. This surgery may be done to treat problems such as: Noncancerous growths in the uterus (uterine fibroids) that cause symptoms. A condition that causes the lining of the uterus to grow in other areas (endometriosis). Problems with pelvic  support. Cancer of the cervix, ovaries, uterus, or tissue that lines the uterus (endometrium). Excessive bleeding in the uterus. When removing your uterus, your health care provider may also remove the ovaries and the fallopian tubes. After this procedure, you will no longer beable to have a baby, and you will no longer have a menstrual period. Tell a health care provider about: Any allergies you have. All medicines you are taking, including vitamins, herbs, eye drops, creams, and over-the-counter medicines. Any problems you or family members have had with anesthetic medicines. Any blood disorders you have. Any surgeries you have had. Any medical conditions you have. Whether you are pregnant or may be pregnant. What are the risks? Generally, this is a safe procedure. However, problems may occur, including: Bleeding. Infection. Blood clots in the legs or lungs. Damage to nearby structures or organs. Pain during sex. Allergic reactions to medicines. What happens before the procedure? Staying hydrated Follow instructions from your health care provider about hydration, which may include: Up to 2 hours before the procedure - you may continue to drink clear liquids, such as water, clear fruit juice, black coffee, and plain tea.  Eating and drinking restrictions Follow instructions from your health care provider about eating and drinking, which may include: 8 hours before the procedure - stop eating heavy meals  or foods, such as meat, fried foods, or fatty foods. 6 hours before the procedure - stop eating light meals or foods, such as toast or cereal. 6 hours before the procedure - stop drinking milk or drinks that contain milk. 2 hours before the procedure - stop drinking clear liquids. Medicines Ask your health care provider about: Changing or stopping your regular medicines. This is especially important if you are taking diabetes medicines or blood thinners. Taking medicines such as  aspirin and ibuprofen. These medicines can thin your blood. Do not take these medicines unless your health care provider tells you to take them. Taking over-the-counter medicines, vitamins, herbs, and supplements. You may be asked to take a medicine to empty your colon (bowel preparation). General instructions If you were asked to do a bowel preparation before the procedure, follow instructions from your health care provider. This procedure can affect the way you feel about yourself. Talk with your health care provider about the physical and emotional changes hysterectomy may cause. Do not use any products that contain nicotine or tobacco for at least 4 weeks before the procedure. These products include cigarettes, e-cigarettes, and chewing tobacco. If you need help quitting, ask your health care provider. Plan to have a responsible adult take you home from the hospital or clinic. Plan to have a responsible adult care for you for the time you are told after you leave the hospital or clinic. This is important. Surgery safety Ask your health care provider: How your surgery site will be marked. What steps will be taken to help prevent infection. These may include: Removing hair at the surgery site. Washing skin with a germ-killing soap. Receiving antibiotic medicine. What happens during the procedure? An IV will be inserted into one of your veins. You will be given one or more of the following: A medicine to help you relax (sedative). A medicine to numb the area (local anesthetic). A medicine to make you fall asleep (general anesthetic). A medicine that is injected into your spine to numb the area below and slightly above the injection site (spinal anesthetic). A medicine that is injected into an area of your body to numb everything below the injection site (regional anesthetic). Your surgeon will make an incision in your vagina. Your surgeon will locate and remove all or part of your uterus.  Part or all of the uterus will be removed through the vagina. Your ovaries and fallopian tubes may be removed at the same time. The incision in your vagina will be closed with stitches (sutures) that dissolve over time. The procedure may vary among health care providers and hospitals. What happens after the procedure? Your blood pressure, heart rate, breathing rate, and blood oxygen level will be monitored until you leave the hospital or clinic. You will be encouraged to walk as soon as possible. You will also use a device or do breathing exercises to keep your lungs clear. You may have to wear compression stockings. These stockings help to prevent blood clots and reduce swelling in your legs. You will be given pain medicine as needed. You will need to wear a sanitary pad for vaginal discharge or bleeding. Summary A vaginal hysterectomy is a procedure to remove all or part of the uterus through the vagina. You may need a vaginal hysterectomy to treat a variety of abnormalities of the uterus. Plan to have a responsible adult take you home from the hospital or clinic. Plan to have a responsible adult care for you for the  time you are told after you leave the hospital or clinic. This is important. This information is not intended to replace advice given to you by your health care provider. Make sure you discuss any questions you have with your healthcare provider. Document Revised: 01/20/2020 Document Reviewed: 01/20/2020 Elsevier Patient Education  [employer].

## 2020-12-18 ENCOUNTER — Encounter: Payer: Self-pay | Admitting: Family Medicine

## 2020-12-19 ENCOUNTER — Telehealth: Payer: Self-pay

## 2020-12-19 ENCOUNTER — Ambulatory Visit (HOSPITAL_COMMUNITY)
Admission: RE | Admit: 2020-12-19 | Discharge: 2020-12-19 | Disposition: A | Discharge status: Home / Self Care | Payer: Medicare Other | Source: Ambulatory Visit | Attending: Obstetrics and Gynecology | Admitting: Obstetrics and Gynecology

## 2020-12-19 ENCOUNTER — Other Ambulatory Visit: Payer: Self-pay

## 2020-12-19 DIAGNOSIS — R9389 Abnormal findings on diagnostic imaging of other specified body structures: Secondary | ICD-10-CM | POA: Diagnosis not present

## 2020-12-19 DIAGNOSIS — N8501 Benign endometrial hyperplasia: Secondary | ICD-10-CM

## 2020-12-19 DIAGNOSIS — N950 Postmenopausal bleeding: Secondary | ICD-10-CM | POA: Diagnosis present

## 2020-12-19 NOTE — Telephone Encounter (Signed)
Attempted to reach by telephone to schedule CCM visit. Would like to discuss elevated blood sugars. Will try to reach again tomorrow.  Debbora Dus, PharmD Clinical Pharmacist [facility] [phone]

## 2020-12-25 ENCOUNTER — Encounter: Payer: Self-pay | Admitting: Family Medicine

## 2020-12-26 ENCOUNTER — Other Ambulatory Visit: Payer: Self-pay | Admitting: Family Medicine

## 2020-12-26 ENCOUNTER — Encounter: Payer: Self-pay | Admitting: Family Medicine

## 2020-12-26 DIAGNOSIS — E1122 Type 2 diabetes mellitus with diabetic chronic kidney disease: Secondary | ICD-10-CM

## 2020-12-26 DIAGNOSIS — E1169 Type 2 diabetes mellitus with other specified complication: Secondary | ICD-10-CM

## 2020-12-26 DIAGNOSIS — N1831 Chronic kidney disease, stage 3a: Secondary | ICD-10-CM

## 2020-12-26 MED ORDER — EMPAGLIFLOZIN 10 MG PO TABS: 10.0000 mg | ORAL_TABLET | Freq: Every day | ORAL | 0 refills | Status: DC

## 2020-12-26 MED ORDER — LANTUS SOLOSTAR 100 UNIT/ML ~~LOC~~ SOPN: 30.0000 [IU] | PEN_INJECTOR | Freq: Every day | SUBCUTANEOUS | 1 refills | Status: DC

## 2020-12-26 NOTE — Telephone Encounter (Signed)
Changed to lantus. Make sure patient is aware that her dosage amount may change.   Will still do 30 units but should check new pen for instructions.

## 2020-12-27 ENCOUNTER — Ambulatory Visit (INDEPENDENT_AMBULATORY_CARE_PROVIDER_SITE_OTHER): Payer: Medicare Other

## 2020-12-27 ENCOUNTER — Other Ambulatory Visit: Payer: Self-pay | Admitting: Family Medicine

## 2020-12-27 ENCOUNTER — Other Ambulatory Visit: Payer: Self-pay

## 2020-12-27 DIAGNOSIS — E1122 Type 2 diabetes mellitus with diabetic chronic kidney disease: Secondary | ICD-10-CM

## 2020-12-27 DIAGNOSIS — E1159 Type 2 diabetes mellitus with other circulatory complications: Secondary | ICD-10-CM | POA: Diagnosis not present

## 2020-12-27 DIAGNOSIS — I152 Hypertension secondary to endocrine disorders: Secondary | ICD-10-CM

## 2020-12-27 NOTE — Progress Notes (Signed)
Chronic Care Management Pharmacy Note  12/27/2020  Summary:  Fasting BG 150, 161 since increased to 30 units insulin 2 days ago. She continues Trulicity 1.5 mg, Jardiance 10 mg, and Glipizide 10 mg daily. She takes the glipizide at bedtime so I suggested changing this to before breakfast. Diet varies a lot, but tries to avoid all sweets and limit carbs including bread, pasta, and potatoes. Some days she eats very little and glucose is still high. She did miss her oral meds on 7/25 but reports this is very rare for her. Reviewed glucose goals in detail. She states her diabetes was well controlled until about 6 months ago. She started medroxyprogesterone 05/2020 which may worsen glucose control around that time period.  Recommendations: --See below --Consider impact of Provera on patient's BG control/ risks versus benefits  Plan: --Increase Lantus to 32 units if fasting glucose is above 140 tomorrow morning. --Take glipizide in the morning instead of bedtime  --Follow up with PCP 01/09/21  Name:  Valerie Lamb MRN:  [id number] DOB:  [birth year]  Subjective: Valerie Lamb is an 65 y.o. year old female who is a primary patient of Cody, Jobe Marker, MD.  The CCM team was consulted for assistance with disease management and care coordination needs.    Engaged with patient by telephone for follow up visit in response to provider referral for pharmacy case management and/or care coordination services.   Consent to Services:  The patient was given information about Chronic Care Management services, agreed to services, and gave verbal consent prior to initiation of services.  Please see initial visit note for detailed documentation.   Patient Care Team: Lesleigh Noe, MD as PCP - General (Family Medicine) Debbora Dus, [facility] as Pharmacist (Pharmacist)  Recent office visits:  12/25/20 - Telephone note - BG log - (7-21   161)-( 7-22 162)- ( 7-23  236)-(7-24  167)-( 7-25  159)-( 7-26  168) Increase  insulin to 30 units daily.  12/18/20 - Telephone note - BG log - (7-15    147),   (7-16  229),      ( 7-17  204),  &7-18  148),  (7-19  259). Increase insulin to 28 units daily.   Recent consult visits:  12/13/20 - OBGYN - Presents for discussion of hysterectomy.    Hospital visits:  None in previous 6 months   Objective:  Lab Results  Component Value Date   CREATININE 1.01 07/12/2020   BUN 11 07/12/2020   GFR 59.30 (L) 07/12/2020   NA 137 07/12/2020   K 3.8 07/12/2020   CALCIUM 10.2 07/12/2020   CO2 31 07/12/2020   GLUCOSE 211 (H) 07/12/2020    Lab Results  Component Value Date/Time   HGBA1C 9.3 (A) 10/09/2020 09:59 AM   HGBA1C 10.2 (H) 07/12/2020 12:40 PM   GFR 59.30 (L) 07/12/2020 12:40 PM   MICROALBUR 4.7 (H) 10/24/2020 11:07 AM    Lab Results  Component Value Date   CHOL 173 07/12/2020   HDL 41.90 07/12/2020   LDLCALC 103 (H) 07/12/2020   TRIG 138.0 07/12/2020   CHOLHDL 4 07/12/2020    Hepatic Function Latest Ref Rng & Units 07/12/2020  Total Protein 6.0 - 8.3 g/dL 7.1  Albumin 3.5 - 5.2 g/dL 4.1  AST 0 - 37 U/L 27  ALT 0 - 35 U/L 27  Alk Phosphatase 39 - 117 U/L 73  Total Bilirubin 0.2 - 1.2 mg/dL 0.8    Lab Results  Component Value Date/Time  TSH 2.33 05/07/2020 09:19 AM   FREET4 0.94 05/07/2020 09:19 AM    CBC Latest Ref Rng & Units 08/23/2020  WBC 4.0 - 10.5 K/uL 7.1  Hemoglobin 12.0 - 15.0 g/dL 16.0(H)  Hematocrit 36.0 - 46.0 % 45.1  Platelets 150 - 400 K/uL 169    No results found for: VD25OH  Clinical ASCVD: No  The ASCVD Risk score Mikey Bussing DC Jr., et al., 2013) failed to calculate for the following reasons:   Unable to determine if patient is Non-Hispanic African American    Depression screen [facility] 2/9 10/09/2020 05/07/2020  Decreased Interest 1 3  Down, Depressed, Hopeless 2 2  PHQ - 2 Score 3 5  Altered sleeping 3 3  Tired, decreased energy 3 3  Change in appetite 1 3  Feeling bad or failure about yourself  1 3  Trouble concentrating 2  3  Moving slowly or fidgety/restless 0 2  Suicidal thoughts 0 0  PHQ-9 Score 13 22  Difficult doing work/chores Somewhat difficult Somewhat difficult    Social History   Tobacco Use  Smoking Status Every Day   Packs/day: 0.25   Years: 46.00   Pack years: 11.50   Types: Cigarettes   Start date: 1973  Smokeless Tobacco Never  Tobacco Comments   2 ppd previously, cut back 2021   BP Readings from Last 3 Encounters:  12/13/20 116/76  10/24/20 100/62  10/15/20 125/80   Pulse Readings from Last 3 Encounters:  12/13/20 75  10/24/20 68  10/15/20 68   Wt Readings from Last 3 Encounters:  12/13/20 232 lb (105.2 kg)  10/24/20 241 lb 4 oz (109.4 kg)  10/15/20 240 lb (108.9 kg)   BMI Readings from Last 3 Encounters:  12/13/20 36.34 kg/m  10/24/20 37.79 kg/m  10/15/20 37.59 kg/m    Assessment/Interventions: Review of patient past medical history, allergies, medications, health status, including review of consultants reports, laboratory and other test data, was performed as part of comprehensive evaluation and provision of chronic care management services.   SDOH:  (Social Determinants of Health) assessments and interventions performed: Yes   SDOH Screenings   Alcohol Screen: Not on file  Depression (PHQ2-9): Medium Risk   PHQ-2 Score: 13  Financial Resource Strain: Low Risk    Difficulty of Paying Living Expenses: Not very hard  Food Insecurity: Not on file  Housing: Not on file  Physical Activity: Not on file  Social Connections: Not on file  Stress: Not on file  Tobacco Use: High Risk   Smoking Tobacco Use: Every Day   Smokeless Tobacco Use: Never  Transportation Needs: Not on file    [place]  Allergies  Allergen Reactions   Metformin Nausea And Vomiting    Medications Reviewed Today     Reviewed by Chancy Milroy, MD (Physician) on 12/13/20 at [place] List Status: <None>   Medication Order Taking? Sig Documenting Provider Last Dose Status  Informant  ACCU-CHEK GUIDE test strip [id number]  Use to test blood sugar up to 2 times a day. Lesleigh Noe, MD  Active   albuterol (VENTOLIN HFA) 108 (90 Base) MCG/ACT inhaler [id number] No Inhale 1-2 puffs into the lungs every 6 (six) hours as needed for shortness of breath or wheezing. [provider] Taking Active Self  ALPRAZolam (XANAX) 0.25 MG tablet [id number] No Take 1 tablet (0.25 mg total) by mouth daily as needed for anxiety. Lesleigh Noe, MD Taking Active   atenolol (TENORMIN) 25 MG tablet  [id number] No Take 1 tablet (25 mg total) by mouth daily. Lesleigh Noe, MD Taking Active   empagliflozin (JARDIANCE) 10 MG TABS tablet [id number] No Take 1 tablet (10 mg total) by mouth daily before breakfast. Lesleigh Noe, MD Taking Active   glipiZIDE (GLUCOTROL) 10 MG tablet [id number] No Take 1 tablet (10 mg total) by mouth daily. Lesleigh Noe, MD Taking Active Self  hydrochlorothiazide (HYDRODIURIL) 25 MG tablet [id number] No Take 25 mg by mouth daily. [provider] Taking Active Self  ibuprofen (ADVIL) 800 MG tablet [id number] No Take 800 mg by mouth every 8 (eight) hours as needed for moderate pain. [provider] Taking Active   insulin glargine (LANTUS SOLOSTAR) 100 UNIT/ML Solostar Pen [id number] No Inject 10 Units into the skin daily. Lesleigh Noe, MD Taking Active   Insulin Pen Needle 31G X 5 MM MISC [id number] No Use once daily to inject 10 units Lesleigh Noe, MD Taking Active   levothyroxine (SYNTHROID) 100 MCG tablet [id number] No Take 100 mcg by mouth daily before breakfast. [provider] Taking Active Self  loratadine (CLARITIN) 10 MG tablet [id number] No Take 10 mg by mouth daily. [provider] Taking Active   lovastatin (MEVACOR) 40 MG tablet [id number] No Take 1 tablet (40 mg total) by mouth at bedtime. Lesleigh Noe, MD Taking Active   medroxyPROGESTERone (PROVERA) 10 MG tablet [id number] No Take 1 tablet (10 mg total) by  mouth in the morning and at bedtime. Lesleigh Noe, MD Taking Active Self  melatonin 5 MG TABS [id number] No Take 5 mg by mouth at bedtime as needed (sleep). [provider] Taking Active Self  omeprazole (PRILOSEC) 40 MG capsule [id number] No Take 1 capsule (40 mg total) by mouth 2 (two) times daily. Lesleigh Noe, MD Taking Active   Tiotropium Bromide Monohydrate 2.5 MCG/ACT AERS [id number] No Inhale 2 puffs into the lungs daily.  [provider] Taking Active Self  triamcinolone ointment (KENALOG) 0.5 % [id number] No Apply 1 application topically 2 (two) times daily. Lesleigh Noe, MD Taking Active   TRULICITY 1.5 KX/3.8HW Bonney Aid [id number] No INJECT 1.5 MG INTO THE SKIN ONCE A WEEK. Lesleigh Noe, MD Taking Active             Patient Active Problem List   Diagnosis Date Noted   Morbid obesity ([place]) 10/15/2020   Eczema 10/09/2020   Pre-operative clearance 08/13/2020   Abnormal uterine bleeding 07/12/2020   Hyperlipidemia associated with type 2 diabetes mellitus ([place]) 07/12/2020   Numbness of finger 07/12/2020   Tremor 07/12/2020   Insomnia 06/25/2020   Endometrial hyperplasia without atypia, simple 05/15/2020   Postmenopausal bleeding 05/10/2020   Hypothyroidism 05/07/2020   Type 2 diabetes mellitus with diabetic chronic kidney disease ([place]) 05/07/2020   COPD (chronic obstructive pulmonary disease) ([place]) 05/07/2020   Hypertension associated with diabetes ([place]) 05/07/2020   GERD (gastroesophageal reflux disease) 05/07/2020   Neuropathy 05/07/2020   Chronic low back pain 05/07/2020   Current moderate episode of major depressive disorder without prior episode ([place]) 05/07/2020   Tobacco abuse 05/07/2020    Immunization History  Administered Date(s) Administered   Influenza,inj,Quad PF,6+ Mos 02/22/2020   Janssen (J&J) SARS-COV-2 Vaccination 10/01/2019   PFIZER Comirnaty([place])Covid-19 Tri-Sucrose Vaccine 05/17/2020   Tdap 05/07/2020    Conditions to be  addressed/monitored:  Hypertension and Diabetes  Patient Care Plan: CCM Pharmacy Care Plan     Problem Identified: CHL AMB "PATIENT-SPECIFIC PROBLEM"  Long-Range Goal: Disease Managament   Start Date: 10/31/2020  Priority: High  Note:   Current Barriers:   Unable to achieve control of diabetes   Pharmacist Clinical Goal(s):   Patient will contact provider office for questions/concerns as evidenced notation of same in electronic health record through collaboration with PharmD and provider.   Interventions:  1:1 collaboration with Lesleigh Noe, MD regarding development and update of comprehensive plan of care as evidenced by provider attestation and co-signature  Inter-disciplinary care team collaboration (see longitudinal plan of care)  Comprehensive medication review performed; medication list updated in electronic medical record  Hypertension (BP goal <140/90) -Controlled - per clinic readings  No updates/changes today 12/27/20 -Current treatment:  HCTZ 25 mg - 1 tablet daily  Atenolol 25 mg - 1 tablet daily (primarily for tremor) -Medications previously tried: propranolol - worsened COPD -Current home readings: none, has not checked in a while, misplaced her BP meter -Current exercise habits: walks her dog a lot and grandchildren keep her active -Denies hypotensive/hypertensive symptoms -Recommended to continue current medication  Diabetes (A1c goal <7%) -Uncontrolled - A1c 9.3% -She is sending her BG log to Dr. Einar Pheasant once a week and BG have been up and down. She was instructed per chart message today (7/26) to increase her Lantus to 30 units. Her insulin has been increased from 12 to 30 units after the past several months. She states her diet is very good and she is not sure why her BG are spiking. States this was not a problem prior to husband passing. -Current medications:  Jardiance 10 mg - 1 tablet daily (AM)  Glipizide 10 mg - 1 tablet daily (PM)   Lantus - 30  units daily in the morning (AM)  Trulicity 1.5 mg - Inject once weekly (Wednesdays) -Medications previously tried: metformin - n/v, Trulicity 3 mg - n/v, Actos 30 mg   -Current home glucose readings - checks first thing in morning and occasional after meals  fasting glucose: 150, 161 (yesterday, today)  post prandial glucose: 259 (reports BG spike > 200 after meals) -Extensive time spent counseling/teach back method on glucose goals, (Fasting 100-140) before and 2 hours after meals (< 200) and carbohydrates impact on BG.  -She uses pillbox, she missed pills on 7/25, but reports she had not missed them in 5-6 months prior. -Patient reports recent normal foot exam completed by PCP. She is still due for her annual eye exam. -Assessed patient finances. No cost concerns - meds are covered  -Recommend:  ---Trulicity on back order, [facility] is [facility]. Told her to call me Sunday is she is unable to get this and [facility] can deliver. --- If BG > 140, go ahead and increase Lantus to 32 units tomorrow.  ---Take glipizide in the morning before breakfast, instead of bedtime.   Other: Medroxyprogesterone for bleeding (continuing until hysterectomy)  Patient Goals/Self-Care Activities  Patient will:  - take medications as prescribed  Follow Up Plan: Telephone follow up appointment with care management team member scheduled for:  --PCP visit 01/09/21 --CCM 30 days     Medication Assistance: None required.  Patient affirms current coverage meets needs.  Compliance/Adherence/Medication fill history: Care Gaps: Diabetic eye exam due   Star-Rating Drugs: Medication:                Last Fill:         Day Supply Jardiance 44m  10/12/20            90 Lovastatin 54m          10/09/20            90         Trulicity 18.2CM/0.3KJ  11/29/20            28                      Patient's [facility] pharmacy [facility]  [id number] [id number]: [phone]: [phone] pill box? Yes - takes most meds at night, uses pillbox for all except the BID meds  Care Plan and Follow Up Patient Decision:  Patient agrees to Care Plan and Follow-up.  MiDebbora DusPharmD Clinical Pharmacist [phone]

## 2020-12-27 NOTE — Patient Instructions (Signed)
Dear Valerie Lamb,  Below is a summary of the goals we discussed during our follow up appointment on December 27, 2020. Please contact me anytime with questions or concerns.   Visit Information  Patient Care Plan: CCM Pharmacy Care Plan     Problem Identified: CHL AMB "PATIENT-SPECIFIC PROBLEM"      Long-Range Goal: Disease Managament   Start Date: 10/31/2020  Priority: High  Note:   Current Barriers:   Unable to achieve control of diabetes   Pharmacist Clinical Goal(s):   Patient will contact provider office for questions/concerns as evidenced notation of same in electronic health record through collaboration with PharmD and provider.   Interventions:  1:1 collaboration with Lesleigh Noe, MD regarding development and update of comprehensive plan of care as evidenced by provider attestation and co-signature  Inter-disciplinary care team collaboration (see longitudinal plan of care)  Comprehensive medication review performed; medication list updated in electronic medical record  Hypertension (BP goal <140/90) -Controlled - per clinic readings  No updates/changes today 12/27/20 -Current treatment:  HCTZ 25 mg - 1 tablet daily  Atenolol 25 mg - 1 tablet daily (primarily for tremor) -Medications previously tried: propranolol - worsened COPD -Current home readings: none, has not checked in a while, misplaced her BP meter -Current exercise habits: walks her dog a lot and grandchildren keep her active -Denies hypotensive/hypertensive symptoms -Recommended to continue current medication  Diabetes (A1c goal <7%) -Uncontrolled - A1c 9.3% -She is sending her BG log to Dr. Einar Pheasant once a week and BG have been up and down. She was instructed per chart message today (7/26) to increase her Lantus to 30 units. Her insulin has been increased from 12 to 30 units after the past several months. She states her diet is very good and she is not sure why her BG are spiking. States this was not a problem  prior to husband passing. -Current medications:  Jardiance 10 mg - 1 tablet daily (AM)  Glipizide 10 mg - 1 tablet daily (PM)   Lantus - 30 units daily in the morning (AM)  Trulicity 1.5 mg - Inject once weekly (Wednesdays) -Medications previously tried: metformin - n/v, Trulicity 3 mg - n/v, Actos 30 mg   -Current home glucose readings - checks first thing in morning and occasional after meals  fasting glucose: 150, 161 (yesterday, today)  post prandial glucose: 259 (reports BG spike > 200 after meals) -Extensive time spent counseling/teach back method on glucose goals, (Fasting 100-140) before and 2 hours after meals (< 200) and carbohydrates impact on BG.  -She uses pillbox, she missed pills on 7/25, but reports she had not missed them in 5-6 months prior. -Patient reports recent normal foot exam completed by PCP. She is still due for her annual eye exam. -Assessed patient finances. No cost concerns - meds are covered  -Recommend:  ---[facility]icity on back order, [facility]. Told her to call me Sunday is she is unable to get this and [facility] can deliver. --- If BG > 140, go ahead and increase Lantus to 32 units tomorrow.  ---Take glipizide in the morning before breakfast, instead of bedtime.   Other: Medroxyprogesterone for bleeding (continuing until hysterectomy)  Patient Goals/Self-Care Activities  Patient will:  - take medications as prescribed  Follow Up Plan: Telephone follow up appointment with care management team member scheduled for:  --PCP visit 01/09/21 --CCM 30 days      Patient verbalizes understanding of instructions provided today  and agrees to view in [facility].   Debbora Dus, PharmD Clinical Pharmacist [facility] at [facility] [phone]

## 2020-12-28 NOTE — Telephone Encounter (Signed)
Patient called back to schedule and visit completed.

## 2021-01-01 ENCOUNTER — Encounter: Payer: Self-pay | Admitting: Family Medicine

## 2021-01-08 ENCOUNTER — Encounter: Payer: Self-pay | Admitting: Family Medicine

## 2021-01-09 ENCOUNTER — Ambulatory Visit: Payer: Medicare Other | Admitting: Family Medicine

## 2021-01-15 ENCOUNTER — Telehealth: Payer: Self-pay | Admitting: Radiology

## 2021-01-15 ENCOUNTER — Other Ambulatory Visit: Payer: Self-pay

## 2021-01-15 ENCOUNTER — Ambulatory Visit (INDEPENDENT_AMBULATORY_CARE_PROVIDER_SITE_OTHER): Payer: Medicare Other | Admitting: Family Medicine

## 2021-01-15 ENCOUNTER — Encounter: Payer: Self-pay | Admitting: Family Medicine

## 2021-01-15 VITALS — BP 108/62 | HR 63 | Temp 97.6°F | Ht 67.0 in | Wt 227.8 lb

## 2021-01-15 DIAGNOSIS — Z720 Tobacco use: Secondary | ICD-10-CM

## 2021-01-15 DIAGNOSIS — E1122 Type 2 diabetes mellitus with diabetic chronic kidney disease: Secondary | ICD-10-CM

## 2021-01-15 DIAGNOSIS — N939 Abnormal uterine and vaginal bleeding, unspecified: Secondary | ICD-10-CM | POA: Diagnosis not present

## 2021-01-15 DIAGNOSIS — E1159 Type 2 diabetes mellitus with other circulatory complications: Secondary | ICD-10-CM | POA: Diagnosis not present

## 2021-01-15 DIAGNOSIS — E785 Hyperlipidemia, unspecified: Secondary | ICD-10-CM | POA: Diagnosis not present

## 2021-01-15 DIAGNOSIS — G629 Polyneuropathy, unspecified: Secondary | ICD-10-CM | POA: Diagnosis not present

## 2021-01-15 DIAGNOSIS — I152 Hypertension secondary to endocrine disorders: Secondary | ICD-10-CM | POA: Diagnosis not present

## 2021-01-15 DIAGNOSIS — E1169 Type 2 diabetes mellitus with other specified complication: Secondary | ICD-10-CM

## 2021-01-15 DIAGNOSIS — E876 Hypokalemia: Secondary | ICD-10-CM

## 2021-01-15 LAB — LIPID PANEL
Cholesterol: 139 mg/dL (ref 0–200)
HDL: 33.6 mg/dL — ABNORMAL LOW (ref >39.00)
LDL Cholesterol: 66 mg/dL (ref 0–99)
NonHDL: 104.98
Total CHOL/HDL Ratio: 4
Triglycerides: 194 mg/dL — ABNORMAL HIGH (ref 0.0–149.0)
VLDL: 38.8 mg/dL (ref 0.0–40.0)

## 2021-01-15 LAB — CBC
HCT: 48.6 % — ABNORMAL HIGH (ref 36.0–46.0)
Hemoglobin: 16.6 g/dL — ABNORMAL HIGH (ref 12.0–15.0)
MCHC: 34.3 g/dL (ref 30.0–36.0)
MCV: 89.4 fl (ref 78.0–100.0)
Platelets: 176 10*3/uL (ref 150.0–400.0)
RBC: 5.44 Mil/uL — ABNORMAL HIGH (ref 3.87–5.11)
RDW: 14.5 % (ref 11.5–15.5)
WBC: 7.8 10*3/uL (ref 4.0–10.5)

## 2021-01-15 LAB — COMPREHENSIVE METABOLIC PANEL
ALT: 15 U/L (ref 0–35)
AST: 18 U/L (ref 0–37)
Albumin: 3.9 g/dL (ref 3.5–5.2)
Alkaline Phosphatase: 54 U/L (ref 39–117)
BUN: 19 mg/dL (ref 6–23)
CO2: 27 mEq/L (ref 19–32)
Calcium: 9.2 mg/dL (ref 8.4–10.5)
Chloride: 101 mEq/L (ref 96–112)
Creatinine, Ser: 0.85 mg/dL (ref 0.40–1.20)
GFR: 72.67 mL/min (ref >60.00)
Glucose, Bld: 129 mg/dL — ABNORMAL HIGH (ref 70–99)
Potassium: 2.6 mEq/L — CL (ref 3.5–5.1)
Sodium: 138 mEq/L (ref 135–145)
Total Bilirubin: 0.7 mg/dL (ref 0.2–1.2)
Total Protein: 6.7 g/dL (ref 6.0–8.3)

## 2021-01-15 LAB — POCT GLYCOSYLATED HEMOGLOBIN (HGB A1C): Hemoglobin A1C: 7.2 % — AB (ref 4.0–5.6)

## 2021-01-15 MED ORDER — VARENICLINE TARTRATE 0.5 MG PO TABS: ORAL_TABLET | ORAL | 0 refills | Status: AC

## 2021-01-15 MED ORDER — POTASSIUM CHLORIDE CRYS ER 20 MEQ PO TBCR: 40.0000 meq | EXTENDED_RELEASE_TABLET | Freq: Every day | ORAL | 0 refills | Status: DC

## 2021-01-15 MED ORDER — PREGABALIN 50 MG PO CAPS: 50.0000 mg | ORAL_CAPSULE | Freq: Two times a day (BID) | ORAL | 0 refills | Status: DC

## 2021-01-15 NOTE — Telephone Encounter (Signed)
Pt returned our call. Lab results were relayed as well as instructions for potassium rx. Pt states that she doesn't think she can get transportation for Thursday but will try for Friday and if she can't make it she will let us know.

## 2021-01-15 NOTE — Patient Instructions (Addendum)
#  Diabetes - Great job! - Make appointment to see Sharyn Lull in 1 month  #I will give the OK for surgery  #Tobacco - start Chantix  - set quit day 1-2 weeks into starting - call if tolerating after 1 month for refill of higher dose  #Nerve pain - try lyrica - one week after starting chantix - take once daily and increase to twice daily after 1 week  If you don't get surgery - come back to see me for smoking and nerve pain in about 2 months

## 2021-01-15 NOTE — Telephone Encounter (Signed)
Attempted to call patient with no answer - advised call back. Will also send via mychart.   Routing to MA to attempt to call pt later today or for call back.   Your potassium is very low. I will send in potassium replacement.   Please take 2 pills today  Tomorrow please take 2 pills in the morning and evening Return for labs on Thursday   Do not take your Hydrochlorothiazide until we repeat labs  If you develop chest pain or palpitations go directly to the ER.

## 2021-01-15 NOTE — Telephone Encounter (Signed)
[facility] called a critical K+ 2.6, results given to Dr Einar Pheasant

## 2021-01-15 NOTE — Assessment & Plan Note (Signed)
BP controlled. Cont atenolol 25 mg

## 2021-01-15 NOTE — Assessment & Plan Note (Signed)
Lab Results  Component Value Date   HGBA1C 7.2 (A) 01/15/2021   Significant improvement. Will continue to follow diet and work on quitting smoking. Cont current medications - Trulicity 1.5 mg weekly, glipizide 10 mg daily, jardiance 10 mg daily, Lantus 36 units. F/u with pharmacist in 1 month. If still getting occasional highs may consider endocrine referral

## 2021-01-15 NOTE — Assessment & Plan Note (Signed)
Motivated to quit smoking and has done so in the past with chantix. Start chantix and chose quit date. Update in 3-4 weeks and will send in next dose.

## 2021-01-15 NOTE — Assessment & Plan Note (Signed)
No response to gabapentin. B/l symptoms. Will try lyrica to see if that improves. Start with 50 mg daily and increase to bid as tolerated after 3-5 days.
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
## 2021-01-15 NOTE — Assessment & Plan Note (Signed)
Cont lovastatin. Labs today

## 2021-01-17 ENCOUNTER — Encounter: Payer: Self-pay | Admitting: Family Medicine

## 2021-01-18 ENCOUNTER — Other Ambulatory Visit: Payer: Self-pay

## 2021-01-18 ENCOUNTER — Other Ambulatory Visit (INDEPENDENT_AMBULATORY_CARE_PROVIDER_SITE_OTHER): Payer: Medicare Other

## 2021-01-18 ENCOUNTER — Encounter: Payer: Self-pay | Admitting: Family Medicine

## 2021-01-18 DIAGNOSIS — E876 Hypokalemia: Secondary | ICD-10-CM

## 2021-01-18 LAB — BASIC METABOLIC PANEL
BUN: 21 mg/dL (ref 6–23)
CO2: 26 mEq/L (ref 19–32)
Calcium: 9.3 mg/dL (ref 8.4–10.5)
Chloride: 109 mEq/L (ref 96–112)
Creatinine, Ser: 0.78 mg/dL (ref 0.40–1.20)
GFR: 80.56 mL/min (ref >60.00)
Glucose, Bld: 115 mg/dL — ABNORMAL HIGH (ref 70–99)
Potassium: 3.8 mEq/L (ref 3.5–5.1)
Sodium: 142 mEq/L (ref 135–145)

## 2021-01-21 ENCOUNTER — Encounter: Payer: Self-pay | Admitting: Family Medicine

## 2021-01-22 ENCOUNTER — Other Ambulatory Visit: Payer: Self-pay | Admitting: Family Medicine

## 2021-01-22 DIAGNOSIS — E876 Hypokalemia: Secondary | ICD-10-CM

## 2021-01-24 ENCOUNTER — Telehealth: Payer: Self-pay | Admitting: *Deleted

## 2021-01-24 ENCOUNTER — Encounter: Payer: Self-pay | Admitting: *Deleted

## 2021-01-24 NOTE — Telephone Encounter (Signed)
Call to patient. Surgery date options discussed and patient agreeable to 02-26-21 at 1130 at [facility]. Arrive 0930. Surgery instructions reviewed and letter will be mailed with brochure/map. Advised will receive pre-op call with additional information.  Encounter closed.

## 2021-01-30 ENCOUNTER — Encounter: Payer: Self-pay | Admitting: Family Medicine

## 2021-01-30 DIAGNOSIS — E1122 Type 2 diabetes mellitus with diabetic chronic kidney disease: Secondary | ICD-10-CM

## 2021-01-30 DIAGNOSIS — N1831 Chronic kidney disease, stage 3a: Secondary | ICD-10-CM

## 2021-02-06 ENCOUNTER — Other Ambulatory Visit: Payer: Self-pay | Admitting: Family Medicine

## 2021-02-06 DIAGNOSIS — Z720 Tobacco use: Secondary | ICD-10-CM

## 2021-02-06 NOTE — Telephone Encounter (Signed)
Last refill: 01/15/21 #95 no refills  LASt OV: 01/15/21  Next OV: 03/19/21

## 2021-02-09 ENCOUNTER — Other Ambulatory Visit: Payer: Self-pay | Admitting: Family Medicine

## 2021-02-09 DIAGNOSIS — E1122 Type 2 diabetes mellitus with diabetic chronic kidney disease: Secondary | ICD-10-CM

## 2021-02-13 ENCOUNTER — Encounter: Payer: Self-pay | Admitting: Family Medicine

## 2021-02-13 NOTE — Telephone Encounter (Signed)
I do not see this in the current med list. Please review. Thank you

## 2021-02-14 MED ORDER — VARENICLINE TARTRATE 1 MG PO TABS: 1.0000 mg | ORAL_TABLET | Freq: Two times a day (BID) | ORAL | 0 refills | Status: DC

## 2021-02-15 NOTE — Telephone Encounter (Signed)
Per Dr. Verda Cumins recent office visit note, she is injecting 36 units..  Refill sent to pharmacy.

## 2021-02-17 ENCOUNTER — Other Ambulatory Visit: Payer: Self-pay | Admitting: Family Medicine

## 2021-02-17 DIAGNOSIS — E1169 Type 2 diabetes mellitus with other specified complication: Secondary | ICD-10-CM

## 2021-02-18 ENCOUNTER — Encounter: Payer: Self-pay | Admitting: Family Medicine

## 2021-02-18 DIAGNOSIS — E039 Hypothyroidism, unspecified: Secondary | ICD-10-CM

## 2021-02-18 MED ORDER — LEVOTHYROXINE SODIUM 100 MCG PO TABS: 100.0000 ug | ORAL_TABLET | Freq: Every day | ORAL | 3 refills | Status: DC

## 2021-02-18 NOTE — Addendum Note (Signed)
Addended by: Lesleigh Noe on: 02/18/2021 04:53 PM   Modules accepted: Orders

## 2021-02-20 ENCOUNTER — Telehealth: Payer: Self-pay

## 2021-02-20 NOTE — Chronic Care Management (AMB) (Addendum)
Chronic Care Management Pharmacy Assistant   Name: Valerie Lamb  MRN: [id number] DOB: 08/12/1955   Reason for Encounter: Disease State  Diabetes    Recent office visits:  01/15/21-PCP-Patient presented for follow up diabetes.Labs ordered(potassium low,cholesterol good,new A1C  7.2 -Will try lyrica to see if that improves. Start with 50 mg daily and increase to bid as tolerated after 3-5 days. Start chantix and chose quit date. Cleared for surgery w/GYN. Follow up 2 months   Recent consult visits:  None since last CCM contact  Hospital visits:  None in previous 6 months  Medications: Outpatient Encounter Medications as of 02/20/2021  Medication Sig   ACCU-CHEK GUIDE test strip Use to test blood sugar up to 2 times a day.   albuterol (VENTOLIN HFA) 108 (90 Base) MCG/ACT inhaler Inhale 1-2 puffs into the lungs every 6 (six) hours as needed for shortness of breath or wheezing.   ALPRAZolam (XANAX) 0.25 MG tablet Take 1 tablet (0.25 mg total) by mouth daily as needed for anxiety. (Patient not taking: Reported on 02/19/2021)   atenolol (TENORMIN) 25 MG tablet Take 1 tablet (25 mg total) by mouth daily.   empagliflozin (JARDIANCE) 10 MG TABS tablet Take 1 tablet (10 mg total) by mouth daily before breakfast.   glipiZIDE (GLUCOTROL) 10 MG tablet Take 1 tablet (10 mg total) by mouth daily.   hydrochlorothiazide (HYDRODIURIL) 25 MG tablet Take 25 mg by mouth daily.   ibuprofen (ADVIL) 800 MG tablet Take 800 mg by mouth every 8 (eight) hours as needed for moderate pain.   insulin glargine (LANTUS SOLOSTAR) 100 UNIT/ML Solostar Pen Inject 36 Units into the skin daily. For diabetes.   Insulin Pen Needle 31G X 5 MM MISC Use once daily to inject 10 units   levothyroxine (SYNTHROID) 100 MCG tablet Take 1 tablet (100 mcg total) by mouth daily before breakfast.   loratadine (CLARITIN) 10 MG tablet Take 10 mg by mouth daily.   lovastatin (MEVACOR) 40 MG tablet Take 1 tablet (40 mg total) by mouth at  bedtime.   medroxyPROGESTERone (PROVERA) 10 MG tablet Take 1 tablet (10 mg total) by mouth in the morning and at bedtime.   Melatonin 10 MG TABS Take 10 mg by mouth at bedtime.   omeprazole (PRILOSEC) 40 MG capsule Take 1 capsule (40 mg total) by mouth 2 (two) times daily.   potassium chloride SA (KLOR-CON M20) 20 MEQ tablet Take 1 tablet (20 mEq total) by mouth daily.   pregabalin (LYRICA) 50 MG capsule Take 1 capsule (50 mg total) by mouth 2 (two) times daily.   Tiotropium Bromide Monohydrate 2.5 MCG/ACT AERS Inhale 2 puffs into the lungs daily.    triamcinolone ointment (KENALOG) 0.5 % Apply 1 application topically 2 (two) times daily. (Patient not taking: Reported on 8/46/9629)   TRULICITY 1.5 BM/8.4XL SOPN INJECT 1.5 MG INTO THE SKIN ONCE A WEEK.   varenicline (CHANTIX CONTINUING MONTH PAK) 1 MG tablet Take 1 tablet (1 mg total) by mouth 2 (two) times daily.   No facility-administered encounter medications on file as of 02/20/2021.      Recent Relevant Labs: Lab Results  Component Value Date/Time   HGBA1C 7.2 (A) 01/15/2021 08:23 AM   HGBA1C 9.3 (A) 10/09/2020 09:59 AM   HGBA1C 10.2 (H) 07/12/2020 12:40 PM   MICROALBUR 4.7 (H) 10/24/2020 11:07 AM    Kidney Function Lab Results  Component Value Date/Time   CREATININE 0.78 01/18/2021 01:23 PM   CREATININE 0.85 01/15/2021 08:56 AM  GFR 80.56 01/18/2021 01:23 PM    Attempted contact with Antony Odea 3 times on 02/22/21,02/25/21,02/26/21. Unsuccessful outreach.   Current antihyperglycemic regimen:  Jardiance 10 mg - 1 tablet daily Glipizide 10 mg - 1 tablet daily Lantus - 12 units daily in the morning Trulicity 1.5 mg - Inject once weekly (Tuesdays)   Adherence Review: Is the patient currently on a STATIN medication? Yes Is the patient currently on ACE/ARB medication? No Does the patient have >5 day gap between last estimated fill dates? No  Care Gaps: Annual wellness visit in last year? No Most recent A1C reading:  7.2   01/15/21 Most Recent BP reading: 108/62  63-P  01/15/21  Last eye exam / retinopathy screening: schd. October 22 Last diabetic foot exam:  10/24/20    Star Rating Drugs:  Medication:  Last Fill: Day Supply Trulicity 1.5mg   02/17/21 28 Lantus   02/08/21  50 Jardiance  01/08/21  90 Lovastatin 40mg  01/06/21  90 Glipizide 10mg  12/04/20  90   PCP appointment on 03/19/21  Debbora Dus, CPP notified  Avel Sensor, [place] Assistant [phone]  I have reviewed the care management and care coordination activities outlined in this encounter and I am certifying that I agree with the content of this note. No further action required.  Debbora Dus, PharmD Clinical Pharmacist [facility] [phone]

## 2021-02-21 ENCOUNTER — Telehealth: Payer: Self-pay | Admitting: *Deleted

## 2021-02-21 NOTE — Telephone Encounter (Signed)
Call to patient- left message on voice mail advising of time change on Tuesday 9-27- arrive at 0830 for 1030 procedure.  My Chart message also sent.

## 2021-02-21 NOTE — Pre-Procedure Instructions (Signed)
Surgical Instructions    Your procedure is scheduled on Tuesday 02/26/21.   Report to [facility] Main Entrance "A" at 08:50 A.M., then check in with the Admitting office.  Call this number if you have problems the morning of surgery:  [phone]   If you have any questions prior to your surgery date call [phone]: Open Monday-Friday 8am-4pm    Remember:  Do not eat after midnight the night before your surgery  You may drink clear liquids until 07:50 A.M. the morning of your surgery.   Clear liquids allowed are: Water, Non-Citrus Juices (without pulp), Carbonated Beverages, Clear Tea, Black Coffee ONLY (NO MILK, CREAM OR POWDERED CREAMER of any kind), and Gatorade    Take these medicines the morning of surgery with A SIP OF WATER   atenolol (TENORMIN)  levothyroxine (SYNTHROID)  loratadine (CLARITIN)  omeprazole (PRILOSEC)  pregabalin (LYRICA)   Tiotropium Bromide Monohydrate     Take these medicines if needed:   albuterol (VENTOLIN HFA) 108 (90 Base)   As of today, STOP taking any Aspirin (unless otherwise instructed by your surgeon) Aleve, Naproxen, Ibuprofen, Motrin, Advil, Goody's, BC's, all herbal medications, fish oil, and all vitamins.          WHAT DO I DO ABOUT MY DIABETES MEDICATION?   Do not take oral diabetes medicines (pills) the morning of surgery.   Do not take empagliflozin (JARDIANCE) the day before surgery or the morning of surgery.    Do not take glipiZIDE (GLUCOTROL) the morning of surgery.      THE MORNING OF SURGERY, take  18  units of insulin glargine (LANTUS SOLOSTAR).  The day of surgery, do not take other diabetes injectables, including Byetta (exenatide), Bydureon (exenatide ER), Victoza (liraglutide), or Trulicity (dulaglutide).  If your CBG is greater than 220 mg/dL, you may take  of your sliding scale (correction) dose of insulin.   HOW TO MANAGE YOUR DIABETES BEFORE AND AFTER SURGERY  Why is it important to control my blood  sugar before and after surgery? Improving blood sugar levels before and after surgery helps healing and can limit problems. A way of improving blood sugar control is eating a healthy diet by:  Eating less sugar and carbohydrates  Increasing activity/exercise  Talking with your doctor about reaching your blood sugar goals High blood sugars (greater than 180 mg/dL) can raise your risk of infections and slow your recovery, so you will need to focus on controlling your diabetes during the weeks before surgery. Make sure that the doctor who takes care of your diabetes knows about your planned surgery including the date and location.  How do I manage my blood sugar before surgery? Check your blood sugar at least 4 times a day, starting 2 days before surgery, to make sure that the level is not too high or low.  Check your blood sugar the morning of your surgery when you wake up and every 2 hours until you get to the Short Stay unit.  If your blood sugar is less than 70 mg/dL, you will need to treat for low blood sugar: Do not take insulin. Treat a low blood sugar (less than 70 mg/dL) with  cup of clear juice (cranberry or apple), 4 glucose tablets, OR glucose gel. Recheck blood sugar in 15 minutes after treatment (to make sure it is greater than 70 mg/dL). If your blood sugar is not greater than 70 mg/dL on recheck, call [phone] for further instructions. Report your blood sugar to the short  stay nurse when you get to Short Stay.  If you are admitted to the hospital after surgery: Your blood sugar will be checked by the staff and you will probably be given insulin after surgery (instead of oral diabetes medicines) to make sure you have good blood sugar levels. The goal for blood sugar control after surgery is 80-180 mg/dL.  Do not wear jewelry or makeup Do not wear lotions, powders, perfumes/colognes, or deodorant. Do not shave 48 hours prior to surgery.  Men may shave face and neck. Do not  bring valuables to the hospital. DO Not wear nail polish, gel polish, artificial nails, or any other type of covering on natural nails including finger and toenails. If patients have artificial nails, gel coating, etc. that need to be removed by a nail salon please have this removed prior to surgery or surgery may need to be canceled/delayed if the surgeon/ anesthesia feels like the patient is unable to be adequately monitored.             [facility] is not responsible for any belongings or valuables.  Do NOT Smoke (Tobacco/Vaping)  24 hours prior to your procedure If you use a CPAP at night, you may bring your mask for your overnight stay.   Contacts, glasses, dentures or bridgework may not be worn into surgery, please bring cases for these belongings   For patients admitted to the hospital, discharge time will be determined by your treatment team.   Patients discharged the day of surgery will not be allowed to drive home, and someone needs to stay with them for 24 hours.  NO VISITORS WILL BE ALLOWED IN PRE-OP WHERE PATIENTS GET READY FOR SURGERY.  ONLY 1 SUPPORT PERSON MAY BE PRESENT WHILE YOU ARE IN SURGERY.  IF YOU ARE TO BE ADMITTED, ONCE YOU ARE IN YOUR ROOM YOU WILL BE ALLOWED TWO (2) VISITORS.  Minor children may have two parents present. Special consideration for safety and communication needs will be reviewed on a case by case basis.  Special instructions:    Oral Hygiene is also important to reduce your risk of infection.  Remember - BRUSH YOUR TEETH THE MORNING OF SURGERY WITH YOUR REGULAR TOOTHPASTE   [place]- Preparing For Surgery  Before surgery, you can play an important role. Because skin is not sterile, your skin needs to be as free of germs as possible. You can reduce the number of germs on your skin by washing with CHG (chlorahexidine gluconate) Soap before surgery.  CHG is an antiseptic cleaner which kills germs and bonds with the skin to continue killing germs even  after washing.     Please do not use if you have an allergy to CHG or antibacterial soaps. If your skin becomes reddened/irritated stop using the CHG.  Do not shave (including legs and underarms) for at least 48 hours prior to first CHG shower. It is OK to shave your face.  Please follow these instructions carefully.     Shower the NIGHT BEFORE SURGERY and the MORNING OF SURGERY with CHG Soap.   If you chose to wash your hair, wash your hair first as usual with your normal shampoo. After you shampoo, rinse your hair and body thoroughly to remove the shampoo.  Then [employer] and genitals (private parts) with your normal soap and rinse thoroughly to remove soap.  After that Use CHG Soap as you would any other liquid soap. You can apply CHG directly to the skin and wash  gently with a scrungie or a clean washcloth.   Apply the CHG Soap to your body ONLY FROM THE NECK DOWN.  Do not use on open wounds or open sores. Avoid contact with your eyes, ears, mouth and genitals (private parts). Wash Face and genitals (private parts)  with your normal soap.   Wash thoroughly, paying special attention to the area where your surgery will be performed.  Thoroughly rinse your body with warm water from the neck down.  DO NOT shower/wash with your normal soap after using and rinsing off the CHG Soap.  Pat yourself dry with a CLEAN TOWEL.  Wear CLEAN PAJAMAS to bed the night before surgery  Place CLEAN SHEETS on your bed the night before your surgery  DO NOT SLEEP WITH PETS.   Day of Surgery:  Take a shower with CHG soap. Wear Clean/Comfortable clothing the morning of surgery Do not apply any deodorants/lotions.   Remember to brush your teeth WITH YOUR REGULAR TOOTHPASTE.   Please read over the following fact sheets that you were given.

## 2021-02-22 ENCOUNTER — Encounter (HOSPITAL_COMMUNITY)
Admission: RE | Admit: 2021-02-22 | Discharge: 2021-02-22 | Disposition: A | Discharge status: Home / Self Care | Payer: Medicare Other | Source: Ambulatory Visit | Attending: Obstetrics and Gynecology | Admitting: Obstetrics and Gynecology

## 2021-02-22 ENCOUNTER — Encounter (HOSPITAL_COMMUNITY): Payer: Self-pay

## 2021-02-22 ENCOUNTER — Other Ambulatory Visit: Payer: Self-pay

## 2021-02-22 DIAGNOSIS — E119 Type 2 diabetes mellitus without complications: Secondary | ICD-10-CM | POA: Diagnosis not present

## 2021-02-22 DIAGNOSIS — Z01812 Encounter for preprocedural laboratory examination: Secondary | ICD-10-CM | POA: Diagnosis not present

## 2021-02-22 DIAGNOSIS — Z20822 Contact with and (suspected) exposure to covid-19: Secondary | ICD-10-CM | POA: Diagnosis not present

## 2021-02-22 DIAGNOSIS — N8502 Endometrial intraepithelial neoplasia [EIN]: Secondary | ICD-10-CM | POA: Insufficient documentation

## 2021-02-22 HISTORY — DX: Hypothyroidism, unspecified: E03.9

## 2021-02-22 HISTORY — DX: Gastro-esophageal reflux disease without esophagitis: K21.9

## 2021-02-22 HISTORY — DX: Unspecified asthma, uncomplicated: J45.909

## 2021-02-22 LAB — TYPE AND SCREEN
ABO/RH(D): O POS
Antibody Screen: NEGATIVE

## 2021-02-22 LAB — BASIC METABOLIC PANEL
Anion gap: 10 (ref 5–15)
BUN: 13 mg/dL (ref 8–23)
CO2: 28 mmol/L (ref 22–32)
Calcium: 9.9 mg/dL (ref 8.9–10.3)
Chloride: 99 mmol/L (ref 98–111)
Creatinine, Ser: 0.84 mg/dL (ref 0.44–1.00)
GFR, Estimated: >60 mL/min (ref >60)
Glucose, Bld: 151 mg/dL — ABNORMAL HIGH (ref 70–99)
Potassium: 3.3 mmol/L — ABNORMAL LOW (ref 3.5–5.1)
Sodium: 137 mmol/L (ref 135–145)

## 2021-02-22 LAB — CBC
HCT: 53.5 % — ABNORMAL HIGH (ref 36.0–46.0)
Hemoglobin: 17.8 g/dL — ABNORMAL HIGH (ref 12.0–15.0)
MCH: 29.8 pg (ref 26.0–34.0)
MCHC: 33.3 g/dL (ref 30.0–36.0)
MCV: 89.5 fL (ref 80.0–100.0)
Platelets: 222 10*3/uL (ref 150–400)
RBC: 5.98 MIL/uL — ABNORMAL HIGH (ref 3.87–5.11)
RDW: 14.3 % (ref 11.5–15.5)
WBC: 7.9 10*3/uL (ref 4.0–10.5)
nRBC: 0 % (ref 0.0–0.2)

## 2021-02-22 LAB — GLUCOSE, CAPILLARY: Glucose-Capillary: 164 mg/dL — ABNORMAL HIGH (ref 70–99)

## 2021-02-22 LAB — SARS CORONAVIRUS 2 (TAT 6-24 HRS): SARS Coronavirus 2: NEGATIVE

## 2021-02-22 NOTE — Progress Notes (Addendum)
PCP - Dr. Waunita Schooner Cardiologist - Denies  PPM/ICD - Denies  Chest x-ray - 08/13/20 EKG - 08/14/31 Stress Test - Denies ECHO - Denies Cardiac Cath - Denies  Sleep Study - Yes, pt has OSA CPAP - No  Fasting Blood Sugar - 103-122 Checks Blood Sugar _1-2_ times a day  Blood Thinner Instructions: N/A Aspirin Instructions: N/A  ERAS Protcol - Yes PRE-SURGERY Ensure or G2- No  COVID TEST- Done in PAT 02/22/21   Anesthesia review: Yes, abnormal labs  Patient denies shortness of breath, fever, cough and chest pain at PAT appointment   All instructions explained to the patient, with a verbal understanding of the material. Patient agrees to go over the instructions while at home for a better understanding. Patient also instructed to self quarantine after being tested for COVID-19. The opportunity to ask questions was provided.

## 2021-02-23 ENCOUNTER — Other Ambulatory Visit: Payer: Self-pay | Admitting: Family Medicine

## 2021-02-23 DIAGNOSIS — E876 Hypokalemia: Secondary | ICD-10-CM

## 2021-02-25 NOTE — Anesthesia Preprocedure Evaluation (Addendum)
Anesthesia Evaluation  Patient identified by MRN, date of birth, ID band Patient awake    Reviewed: Allergy & Precautions, H&P , NPO status , Patient's Chart, lab work & pertinent test results  Airway Mallampati: II   Neck ROM: full    Dental   Pulmonary asthma , sleep apnea , COPD, Current Smoker and Patient abstained from smoking.,    breath sounds clear to auscultation       Cardiovascular hypertension,  Rhythm:regular Rate:Normal     Neuro/Psych PSYCHIATRIC DISORDERS Anxiety Depression    GI/Hepatic PUD, GERD  ,  Endo/Other  diabetes, Type 2Hypothyroidism   Renal/GU      Musculoskeletal   Abdominal   Peds  Hematology   Anesthesia Other Findings   Reproductive/Obstetrics                            Anesthesia Physical Anesthesia Plan  ASA: 2  Anesthesia Plan: General   Post-op Pain Management:    Induction: Intravenous  PONV Risk Score and Plan: 2 and Ondansetron, Dexamethasone, Midazolam and Treatment may vary due to age or medical condition  Airway Management Planned: Oral ETT  Additional Equipment:   Intra-op Plan:   Post-operative Plan: Extubation in OR  Informed Consent: I have reviewed the patients History and Physical, chart, labs and discussed the procedure including the risks, benefits and alternatives for the proposed anesthesia with the patient or authorized representative who has indicated his/her understanding and acceptance.     Dental advisory given  Plan Discussed with: CRNA, Anesthesiologist and Surgeon  Anesthesia Plan Comments: (PAT note written 02/25/2021 by Myra Gianotti, PA-C. )       Anesthesia Quick Evaluation

## 2021-02-25 NOTE — Progress Notes (Signed)
Anesthesia Chart Review:  Case: [id number] Date/Time: 02/26/21 1035   Procedure: HYSTERECTOMY VAGINAL possible BSO (Bilateral)   Anesthesia type: Choice   Pre-op diagnosis: PMB, endometrial hyperplasia   Location: [facility] OR ROOM 07 / [facility]   Surgeons: Chancy Milroy, MD       DISCUSSION: Patient is a 65 year old female scheduled for the above procedure. S/p D&C/hysteroscopy with excision of uterine polyp 08/23/20 (pathology: endometrial hyperplasia with atypia, EIN arising in an endometrial polyp)..  History includes smoking, COPD, OSA (does not use CPAP), asthma, HTN, DM2, hypothyroidism, GERD, anemia, endometriosis. BMI is consistent with obesity.  02/22/2021 presurgical COVID-19 test negative.  Anesthesia team to evaluate on the day of surgery.   VS: BP 114/72   Pulse 65   Temp 36.8 C (Oral)   Resp 18   Ht 5\' 7"  (1.702 m)   Wt 104.4 kg   SpO2 98%   BMI 36.05 kg/m    PROVIDERS: Lesleigh Noe, MD is PCP . Last visit 01/25/21. DM controlled improved. HTN and HLD controlled. Chantix recommended for smoking cessation. Lyrica for neuropathy, KCl for hypokalemia.    LABS: Preoperative labs noted. K 3.3. H/H 17.8/53.5, previously 16.6/48.6 on 01/15/21. WBC normal. + Smoker.  (all labs ordered are listed, but only abnormal results are displayed)  Labs Reviewed  GLUCOSE, CAPILLARY - Abnormal; Notable for the following components:      Result Value   Glucose-Capillary 164 (*)    All other components within normal limits  BASIC METABOLIC PANEL - Abnormal; Notable for the following components:   Potassium 3.3 (*)    Glucose, Bld 151 (*)    All other components within normal limits  CBC - Abnormal; Notable for the following components:   RBC 5.98 (*)    Hemoglobin 17.8 (*)    HCT 53.5 (*)    All other components within normal limits  SARS CORONAVIRUS 2 (TAT 6-24 HRS)  TYPE AND SCREEN   A1c 7.2% 01/15/21, down from 9.3% 4 months ago.   IMAGES: CXR 08/13/20: FINDINGS: The heart  size and mediastinal contours are within normal limits. Both lungs are clear. The visualized skeletal structures are unremarkable. IMPRESSION: Negative.   EKG: 08/13/20: SR   CV: N/A  Past Medical History:  Diagnosis Date   Anemia    Anxiety    Asthma    COPD (chronic obstructive pulmonary disease) (HCC)    Depression    Diabetes mellitus without complication ([place])    Endometriosis    Gastric ulcer    GERD (gastroesophageal reflux disease)    Hypertension    Hypothyroidism    Sleep apnea    Thyroid disease     Past Surgical History:  Procedure Laterality Date   HYSTEROSCOPY WITH D & C N/A 08/23/2020   Procedure: DILATATION AND CURETTAGE /HYSTEROSCOPY, polypectomy;  Surgeon: Homero Fellers, MD;  Location: [facility] ORS;  Service: Gynecology;  Laterality: N/A;   TUBAL LIGATION      MEDICATIONS:  ACCU-CHEK GUIDE test strip   albuterol (VENTOLIN HFA) 108 (90 Base) MCG/ACT inhaler   ALPRAZolam (XANAX) 0.25 MG tablet   atenolol (TENORMIN) 25 MG tablet   empagliflozin (JARDIANCE) 10 MG TABS tablet   glipiZIDE (GLUCOTROL) 10 MG tablet   hydrochlorothiazide (HYDRODIURIL) 25 MG tablet   ibuprofen (ADVIL) 800 MG tablet   insulin glargine (LANTUS SOLOSTAR) 100 UNIT/ML Solostar Pen   Insulin Pen Needle 31G X 5 MM MISC   levothyroxine (SYNTHROID) 100 MCG tablet   loratadine (CLARITIN)  10 MG tablet   lovastatin (MEVACOR) 40 MG tablet   medroxyPROGESTERone (PROVERA) 10 MG tablet   Melatonin 10 MG TABS   omeprazole (PRILOSEC) 40 MG capsule   potassium chloride SA (KLOR-CON M20) 20 MEQ tablet   pregabalin (LYRICA) 50 MG capsule   Tiotropium Bromide Monohydrate 2.5 MCG/ACT AERS   triamcinolone ointment (KENALOG) 0.5 %   TRULICITY 1.5 BT/6.6MA SOPN   varenicline (CHANTIX CONTINUING MONTH PAK) 1 MG tablet   No current facility-administered medications for this encounter.    Myra Gianotti, PA-C Surgical Short Stay/Anesthesiology [facility] Phone [phone] [facility] Phone [phone] 02/25/2021 10:25 AM

## 2021-02-26 ENCOUNTER — Encounter (HOSPITAL_COMMUNITY)
Admission: RE | Disposition: A | Discharge status: Home / Self Care | Payer: Self-pay | Source: Ambulatory Visit | Attending: Obstetrics and Gynecology

## 2021-02-26 ENCOUNTER — Observation Stay (HOSPITAL_COMMUNITY)
Admission: RE | Admit: 2021-02-26 | Discharge: 2021-02-27 | Disposition: A | Discharge status: Home / Self Care | Payer: Medicare Other | Source: Ambulatory Visit | Attending: Obstetrics and Gynecology | Admitting: Obstetrics and Gynecology

## 2021-02-26 ENCOUNTER — Encounter (HOSPITAL_COMMUNITY): Payer: Self-pay | Admitting: Obstetrics and Gynecology

## 2021-02-26 ENCOUNTER — Other Ambulatory Visit: Payer: Self-pay

## 2021-02-26 ENCOUNTER — Observation Stay (HOSPITAL_COMMUNITY): Payer: Medicare Other | Admitting: Vascular Surgery

## 2021-02-26 ENCOUNTER — Observation Stay (HOSPITAL_COMMUNITY): Payer: Medicare Other | Admitting: Anesthesiology

## 2021-02-26 DIAGNOSIS — Z794 Long term (current) use of insulin: Secondary | ICD-10-CM | POA: Diagnosis not present

## 2021-02-26 DIAGNOSIS — D259 Leiomyoma of uterus, unspecified: Secondary | ICD-10-CM

## 2021-02-26 DIAGNOSIS — Z9889 Other specified postprocedural states: Secondary | ICD-10-CM

## 2021-02-26 DIAGNOSIS — E039 Hypothyroidism, unspecified: Secondary | ICD-10-CM | POA: Insufficient documentation

## 2021-02-26 DIAGNOSIS — N800 Endometriosis of uterus: Secondary | ICD-10-CM

## 2021-02-26 DIAGNOSIS — Z7984 Long term (current) use of oral hypoglycemic drugs: Secondary | ICD-10-CM | POA: Diagnosis not present

## 2021-02-26 DIAGNOSIS — I10 Essential (primary) hypertension: Secondary | ICD-10-CM | POA: Diagnosis not present

## 2021-02-26 DIAGNOSIS — N8502 Endometrial intraepithelial neoplasia [EIN]: Principal | ICD-10-CM

## 2021-02-26 DIAGNOSIS — Z79899 Other long term (current) drug therapy: Secondary | ICD-10-CM | POA: Insufficient documentation

## 2021-02-26 DIAGNOSIS — J45909 Unspecified asthma, uncomplicated: Secondary | ICD-10-CM | POA: Insufficient documentation

## 2021-02-26 DIAGNOSIS — K219 Gastro-esophageal reflux disease without esophagitis: Secondary | ICD-10-CM | POA: Diagnosis not present

## 2021-02-26 DIAGNOSIS — J449 Chronic obstructive pulmonary disease, unspecified: Secondary | ICD-10-CM | POA: Diagnosis not present

## 2021-02-26 DIAGNOSIS — F17210 Nicotine dependence, cigarettes, uncomplicated: Secondary | ICD-10-CM | POA: Insufficient documentation

## 2021-02-26 DIAGNOSIS — E119 Type 2 diabetes mellitus without complications: Secondary | ICD-10-CM | POA: Insufficient documentation

## 2021-02-26 HISTORY — PX: VAGINAL HYSTERECTOMY: SHX2639

## 2021-02-26 LAB — GLUCOSE, CAPILLARY
Glucose-Capillary: 153 mg/dL — ABNORMAL HIGH (ref 70–99)
Glucose-Capillary: 144 mg/dL — ABNORMAL HIGH (ref 70–99)
Glucose-Capillary: 132 mg/dL — ABNORMAL HIGH (ref 70–99)
Glucose-Capillary: 174 mg/dL — ABNORMAL HIGH (ref 70–99)

## 2021-02-26 SURGERY — HYSTERECTOMY, VAGINAL
Anesthesia: General | Laterality: Bilateral

## 2021-02-26 MED ORDER — FENTANYL CITRATE (PF) 100 MCG/2ML IJ SOLN
INTRAMUSCULAR | Status: AC
  Filled 2021-02-26: qty 2

## 2021-02-26 MED ORDER — GLIPIZIDE 10 MG PO TABS
10.0000 mg | ORAL_TABLET | Freq: Every day | ORAL | Status: DC
  Administered 2021-02-27: 10 mg via ORAL
  Filled 2021-02-26 (×2): qty 1

## 2021-02-26 MED ORDER — ONDANSETRON HCL 4 MG/2ML IJ SOLN
INTRAMUSCULAR | Status: AC
  Filled 2021-02-26: qty 2

## 2021-02-26 MED ORDER — EPHEDRINE SULFATE 50 MG/ML IJ SOLN
INTRAMUSCULAR | Status: DC | PRN
  Administered 2021-02-26 (×2): 5 mg via INTRAVENOUS

## 2021-02-26 MED ORDER — LACTATED RINGERS IV SOLN: INTRAVENOUS | Status: DC | PRN

## 2021-02-26 MED ORDER — ACETAMINOPHEN 500 MG PO TABS: 1000.0000 mg | ORAL_TABLET | ORAL | Status: AC

## 2021-02-26 MED ORDER — KETOROLAC TROMETHAMINE 30 MG/ML IJ SOLN
INTRAMUSCULAR | Status: DC | PRN
  Administered 2021-02-26: 15 mg via INTRAVENOUS

## 2021-02-26 MED ORDER — ACETAMINOPHEN 500 MG PO TABS
ORAL_TABLET | ORAL | Status: AC
  Administered 2021-02-26: 1000 mg via ORAL
  Filled 2021-02-26: qty 2

## 2021-02-26 MED ORDER — FENTANYL CITRATE (PF) 100 MCG/2ML IJ SOLN
25.0000 ug | INTRAMUSCULAR | Status: DC | PRN
  Administered 2021-02-26 (×2): 50 ug via INTRAVENOUS
  Administered 2021-02-26 (×2): 25 ug via INTRAVENOUS

## 2021-02-26 MED ORDER — HYDROMORPHONE HCL 1 MG/ML IJ SOLN
1.0000 mg | INTRAMUSCULAR | Status: DC | PRN
Start: 2021-02-26 — End: 2021-02-27

## 2021-02-26 MED ORDER — MIDAZOLAM HCL 5 MG/5ML IJ SOLN
INTRAMUSCULAR | Status: DC | PRN
  Administered 2021-02-26: 2 mg via INTRAVENOUS

## 2021-02-26 MED ORDER — SOD CITRATE-CITRIC ACID 500-334 MG/5ML PO SOLN
ORAL | Status: AC
  Administered 2021-02-26: 30 mL via ORAL
  Filled 2021-02-26: qty 30

## 2021-02-26 MED ORDER — DULAGLUTIDE 1.5 MG/0.5ML ~~LOC~~ SOAJ: 1.5000 mg | SUBCUTANEOUS | Status: DC

## 2021-02-26 MED ORDER — SOD CITRATE-CITRIC ACID 500-334 MG/5ML PO SOLN: 30.0000 mL | ORAL | Status: AC

## 2021-02-26 MED ORDER — LIDOCAINE HCL (PF) 2 % IJ SOLN
INTRAMUSCULAR | Status: AC
  Filled 2021-02-26: qty 5

## 2021-02-26 MED ORDER — SUGAMMADEX SODIUM 200 MG/2ML IV SOLN
INTRAVENOUS | Status: DC | PRN
  Administered 2021-02-26: 200 mg via INTRAVENOUS

## 2021-02-26 MED ORDER — INSULIN GLARGINE-YFGN 100 UNIT/ML ~~LOC~~ SOLN
36.0000 [IU] | Freq: Every day | SUBCUTANEOUS | Status: DC
  Administered 2021-02-27: 36 [IU] via SUBCUTANEOUS
  Filled 2021-02-26: qty 0.36

## 2021-02-26 MED ORDER — UMECLIDINIUM BROMIDE 62.5 MCG/INH IN AEPB
1.0000 | INHALATION_SPRAY | Freq: Every day | RESPIRATORY_TRACT | Status: DC
  Filled 2021-02-26: qty 7

## 2021-02-26 MED ORDER — PROMETHAZINE HCL 25 MG PO TABS: 12.5000 mg | ORAL_TABLET | Freq: Once | ORAL | Status: AC

## 2021-02-26 MED ORDER — INSULIN PEN NEEDLE 31G X 5 MM MISC: Freq: Every day | Status: DC

## 2021-02-26 MED ORDER — DOCUSATE SODIUM 100 MG PO CAPS
100.0000 mg | ORAL_CAPSULE | Freq: Two times a day (BID) | ORAL | Status: DC
  Administered 2021-02-27: 100 mg via ORAL
  Filled 2021-02-26: qty 1

## 2021-02-26 MED ORDER — LACTATED RINGERS IV SOLN: INTRAVENOUS | Status: DC

## 2021-02-26 MED ORDER — OXYCODONE HCL 5 MG PO TABS
ORAL_TABLET | ORAL | Status: AC
  Filled 2021-02-26: qty 1

## 2021-02-26 MED ORDER — ATENOLOL 25 MG PO TABS
25.0000 mg | ORAL_TABLET | Freq: Every day | ORAL | Status: DC
  Administered 2021-02-27: 25 mg via ORAL
  Filled 2021-02-26 (×2): qty 1

## 2021-02-26 MED ORDER — KETOROLAC TROMETHAMINE 15 MG/ML IJ SOLN: 15.0000 mg | INTRAMUSCULAR | Status: AC

## 2021-02-26 MED ORDER — KETOROLAC TROMETHAMINE 15 MG/ML IJ SOLN
INTRAMUSCULAR | Status: AC
  Administered 2021-02-26: 15 mg via INTRAVENOUS
  Filled 2021-02-26: qty 1

## 2021-02-26 MED ORDER — PROPOFOL 10 MG/ML IV BOLUS
INTRAVENOUS | Status: AC
  Filled 2021-02-26: qty 20

## 2021-02-26 MED ORDER — CEFAZOLIN SODIUM-DEXTROSE 2-4 GM/100ML-% IV SOLN
2.0000 g | INTRAVENOUS | Status: AC
  Administered 2021-02-26: 2 g via INTRAVENOUS

## 2021-02-26 MED ORDER — FENTANYL CITRATE (PF) 250 MCG/5ML IJ SOLN
INTRAMUSCULAR | Status: AC
  Filled 2021-02-26: qty 5

## 2021-02-26 MED ORDER — OXYCODONE HCL 5 MG/5ML PO SOLN
5.0000 mg | Freq: Once | ORAL | Status: AC | PRN
Start: 2021-02-26 — End: 2021-02-26

## 2021-02-26 MED ORDER — POVIDONE-IODINE 10 % EX SWAB
2.0000 "application " | Freq: Once | CUTANEOUS | Status: AC
  Administered 2021-02-26: 2 via TOPICAL

## 2021-02-26 MED ORDER — LEVOTHYROXINE SODIUM 25 MCG PO TABS
100.0000 ug | ORAL_TABLET | Freq: Every day | ORAL | Status: DC
  Administered 2021-02-27: 100 ug via ORAL
  Filled 2021-02-26: qty 4

## 2021-02-26 MED ORDER — EMPAGLIFLOZIN 10 MG PO TABS
10.0000 mg | ORAL_TABLET | Freq: Every day | ORAL | Status: DC
  Administered 2021-02-27: 10 mg via ORAL
  Filled 2021-02-26: qty 1

## 2021-02-26 MED ORDER — TIOTROPIUM BROMIDE MONOHYDRATE 2.5 MCG/ACT IN AERS: 2.0000 | INHALATION_SPRAY | Freq: Every day | RESPIRATORY_TRACT | Status: DC

## 2021-02-26 MED ORDER — ORAL CARE MOUTH RINSE: 15.0000 mL | Freq: Once | OROMUCOSAL | Status: AC

## 2021-02-26 MED ORDER — INSULIN GLARGINE 100 UNIT/ML ~~LOC~~ SOLN
36.0000 [IU] | Freq: Every day | SUBCUTANEOUS | Status: DC
  Filled 2021-02-26: qty 0.36

## 2021-02-26 MED ORDER — KETOROLAC TROMETHAMINE 30 MG/ML IJ SOLN
30.0000 mg | Freq: Four times a day (QID) | INTRAMUSCULAR | Status: DC
Start: 2021-02-26 — End: 2021-02-27
  Administered 2021-02-26 – 2021-02-27 (×3): 30 mg via INTRAVENOUS
  Filled 2021-02-26 (×3): qty 1

## 2021-02-26 MED ORDER — ROCURONIUM BROMIDE 10 MG/ML (PF) SYRINGE
PREFILLED_SYRINGE | INTRAVENOUS | Status: AC
  Filled 2021-02-26: qty 10

## 2021-02-26 MED ORDER — SIMETHICONE 80 MG PO CHEW: 80.0000 mg | CHEWABLE_TABLET | Freq: Four times a day (QID) | ORAL | Status: DC | PRN

## 2021-02-26 MED ORDER — LIDOCAINE HCL (CARDIAC) PF 100 MG/5ML IV SOSY
PREFILLED_SYRINGE | INTRAVENOUS | Status: DC | PRN
Start: 2021-02-26 — End: 2021-02-26
  Administered 2021-02-26: 60 mg via INTRAVENOUS

## 2021-02-26 MED ORDER — ONDANSETRON HCL 4 MG/2ML IJ SOLN
4.0000 mg | Freq: Four times a day (QID) | INTRAMUSCULAR | Status: DC | PRN
  Administered 2021-02-26: 4 mg via INTRAVENOUS
  Filled 2021-02-26 (×3): qty 2

## 2021-02-26 MED ORDER — PHENYLEPHRINE HCL (PRESSORS) 10 MG/ML IV SOLN
INTRAVENOUS | Status: DC | PRN
  Administered 2021-02-26 (×2): 40 ug via INTRAVENOUS

## 2021-02-26 MED ORDER — OXYCODONE-ACETAMINOPHEN 5-325 MG PO TABS: 2.0000 | ORAL_TABLET | ORAL | Status: DC | PRN

## 2021-02-26 MED ORDER — PREGABALIN 50 MG PO CAPS
50.0000 mg | ORAL_CAPSULE | Freq: Two times a day (BID) | ORAL | Status: DC
  Administered 2021-02-27: 50 mg via ORAL
  Filled 2021-02-26: qty 1

## 2021-02-26 MED ORDER — MIDAZOLAM HCL 2 MG/2ML IJ SOLN
INTRAMUSCULAR | Status: AC
  Filled 2021-02-26: qty 2

## 2021-02-26 MED ORDER — ONDANSETRON HCL 4 MG/2ML IJ SOLN
4.0000 mg | Freq: Four times a day (QID) | INTRAMUSCULAR | Status: AC | PRN
  Administered 2021-02-26: 4 mg via INTRAVENOUS

## 2021-02-26 MED ORDER — ONDANSETRON HCL 4 MG/2ML IJ SOLN
INTRAMUSCULAR | Status: DC | PRN
  Administered 2021-02-26: 4 mg via INTRAVENOUS

## 2021-02-26 MED ORDER — PROMETHAZINE HCL 12.5 MG RE SUPP
12.5000 mg | Freq: Once | RECTAL | Status: AC
  Filled 2021-02-26: qty 1

## 2021-02-26 MED ORDER — ONDANSETRON HCL 4 MG PO TABS: 4.0000 mg | ORAL_TABLET | Freq: Four times a day (QID) | ORAL | Status: DC | PRN

## 2021-02-26 MED ORDER — IBUPROFEN 800 MG PO TABS
800.0000 mg | ORAL_TABLET | Freq: Three times a day (TID) | ORAL | Status: DC
Start: 2021-02-27 — End: 2021-02-27

## 2021-02-26 MED ORDER — CEFAZOLIN SODIUM-DEXTROSE 2-4 GM/100ML-% IV SOLN
INTRAVENOUS | Status: AC
  Filled 2021-02-26: qty 100

## 2021-02-26 MED ORDER — PHENYLEPHRINE 40 MCG/ML (10ML) SYRINGE FOR IV PUSH (FOR BLOOD PRESSURE SUPPORT)
PREFILLED_SYRINGE | INTRAVENOUS | Status: AC
  Filled 2021-02-26: qty 10

## 2021-02-26 MED ORDER — HYDROCHLOROTHIAZIDE 25 MG PO TABS
25.0000 mg | ORAL_TABLET | Freq: Every day | ORAL | Status: DC
  Administered 2021-02-27: 25 mg via ORAL
  Filled 2021-02-26 (×2): qty 1

## 2021-02-26 MED ORDER — ROCURONIUM BROMIDE 100 MG/10ML IV SOLN
INTRAVENOUS | Status: DC | PRN
  Administered 2021-02-26: 50 mg via INTRAVENOUS

## 2021-02-26 MED ORDER — SODIUM CHLORIDE 0.9 % IV SOLN
12.5000 mg | Freq: Once | INTRAVENOUS | Status: AC
  Administered 2021-02-26: 12.5 mg via INTRAVENOUS
  Filled 2021-02-26: qty 0.5

## 2021-02-26 MED ORDER — OXYCODONE HCL 5 MG PO TABS
5.0000 mg | ORAL_TABLET | Freq: Once | ORAL | Status: AC | PRN
  Administered 2021-02-26: 5 mg via ORAL

## 2021-02-26 MED ORDER — FENTANYL CITRATE (PF) 100 MCG/2ML IJ SOLN
INTRAMUSCULAR | Status: AC
  Administered 2021-02-26: 25 ug via INTRAVENOUS
  Filled 2021-02-26: qty 2

## 2021-02-26 MED ORDER — PROPOFOL 10 MG/ML IV BOLUS
INTRAVENOUS | Status: DC | PRN
  Administered 2021-02-26: 130 mg via INTRAVENOUS

## 2021-02-26 MED ORDER — CHLORHEXIDINE GLUCONATE 0.12 % MT SOLN
15.0000 mL | Freq: Once | OROMUCOSAL | Status: AC
  Administered 2021-02-26: 15 mL via OROMUCOSAL
  Filled 2021-02-26: qty 15

## 2021-02-26 MED ORDER — OXYCODONE-ACETAMINOPHEN 5-325 MG PO TABS: 1.0000 | ORAL_TABLET | ORAL | Status: DC | PRN

## 2021-02-26 MED ORDER — FENTANYL CITRATE (PF) 100 MCG/2ML IJ SOLN
INTRAMUSCULAR | Status: DC | PRN
  Administered 2021-02-26: 100 ug via INTRAVENOUS

## 2021-02-26 MED ORDER — ZOLPIDEM TARTRATE 5 MG PO TABS: 5.0000 mg | ORAL_TABLET | Freq: Every evening | ORAL | Status: DC | PRN

## 2021-02-26 SURGICAL SUPPLY — 22 items
BLADE SURG 10 STRL SS (BLADE) ×2 IMPLANT
GAUZE 4X4 16PLY ~~LOC~~+RFID DBL (SPONGE) ×2 IMPLANT
GLOVE SRG 8 PF TXTR STRL LF DI (GLOVE) ×1 IMPLANT
GLOVE SURG ENC MOIS LTX SZ7.5 (GLOVE) ×4 IMPLANT
GLOVE SURG UNDER POLY LF SZ6.5 (GLOVE) ×2 IMPLANT
GLOVE SURG UNDER POLY LF SZ7 (GLOVE) ×6 IMPLANT
GLOVE SURG UNDER POLY LF SZ8 (GLOVE) ×1
GOWN STRL REUS W/ TWL XL LVL3 (GOWN DISPOSABLE) ×4 IMPLANT
GOWN STRL REUS W/TWL XL LVL3 (GOWN DISPOSABLE) ×4
KIT TURNOVER KIT B (KITS) ×2 IMPLANT
NS IRRIG 1000ML POUR BTL (IV SOLUTION) ×2 IMPLANT
PACK VAGINAL WOMENS (CUSTOM PROCEDURE TRAY) ×2 IMPLANT
PAD OB MATERNITY 4.3X12.25 (PERSONAL CARE ITEMS) ×2 IMPLANT
SPONGE T-LAP 4X18 ~~LOC~~+RFID (SPONGE) ×2 IMPLANT
SUT VIC AB 2-0 CT1 18 (SUTURE) ×2 IMPLANT
SUT VIC AB 2-0 CT1 27 (SUTURE) ×1
SUT VIC AB 2-0 CT1 TAPERPNT 27 (SUTURE) ×1 IMPLANT
SUT VIC AB PLUS 45CM 1-MO-4 (SUTURE) ×4 IMPLANT
SUT VICRYL 1 TIES 12X18 (SUTURE) ×2 IMPLANT
TOWEL GREEN STERILE FF (TOWEL DISPOSABLE) ×2 IMPLANT
TRAY FOLEY W/BAG SLVR 14FR (SET/KITS/TRAYS/PACK) ×2 IMPLANT
UNDERPAD 30X36 HEAVY ABSORB (UNDERPADS AND DIAPERS) ×2 IMPLANT

## 2021-02-26 NOTE — Plan of Care (Signed)

## 2021-02-26 NOTE — Transfer of Care (Signed)
Immediate Anesthesia Transfer of Care Note  Patient: Valerie Lamb  Procedure(s) Performed: HYSTERECTOMY VAGINAL (Bilateral)  Patient Location: PACU  Anesthesia Type:General  Level of Consciousness: drowsy  Airway & Oxygen Therapy: Patient Spontanous Breathing and Patient connected to face mask  Post-op Assessment: Report given to RN and Post -op Vital signs reviewed and stable  Post vital signs: stable  Last Vitals:  Vitals Value Taken Time  BP 124/65 02/26/21 1200  Temp    Pulse 78 02/26/21 1202  Resp 22 02/26/21 1202  SpO2 100 % 02/26/21 1202  Vitals shown include unvalidated device data.  Last Pain:  Vitals:   02/26/21 0807  TempSrc:   PainSc: 0-No pain         Complications: No notable events documented.

## 2021-02-26 NOTE — H&P (Signed)
Valerie Lamb is an 65 y.o. female with simple endometrial hyperplasia with atypia on an endometrial polyp. Dx by D & C. Desires definite therapy. TVH with possible salpingectomy has been reviewed and discussed with pt.  GYN U/S uterine volume 61 gms  Medical problems managed by PCP  H/O TSVD x 4, largest 9 # 3 oz  Menstrual History: Menarche age: 66 No LMP recorded. Patient is postmenopausal.    Past Medical History:  Diagnosis Date   Anemia    Anxiety    Asthma    COPD (chronic obstructive pulmonary disease) ([place])    Depression    Diabetes mellitus without complication ([place])    Endometriosis    Gastric ulcer    GERD (gastroesophageal reflux disease)    Hypertension    Hypothyroidism    Sleep apnea    Thyroid disease     Past Surgical History:  Procedure Laterality Date   HYSTEROSCOPY WITH D & C N/A 08/23/2020   Procedure: DILATATION AND CURETTAGE /HYSTEROSCOPY, polypectomy;  Surgeon: Homero Fellers, MD;  Location: [facility] ORS;  Service: Gynecology;  Laterality: N/A;   TUBAL LIGATION      Family History  Problem Relation Age of Onset   Alcohol abuse Mother    Arthritis Mother    Asthma Mother    COPD Mother    Depression Mother    Drug abuse Mother    Early death Mother    Hyperlipidemia Mother    Hypertension Mother    Alcohol abuse Father    Cancer Father     Social History:  reports that she has been smoking cigarettes. She started smoking about 49 years ago. She has a 11.50 pack-year smoking history. She has never used smokeless tobacco. She reports that she does not drink alcohol and does not use drugs.  Allergies:  Allergies  Allergen Reactions   Metformin Nausea And Vomiting    Medications Prior to Admission  Medication Sig Dispense Refill Last Dose   albuterol (VENTOLIN HFA) 108 (90 Base) MCG/ACT inhaler Inhale 1-2 puffs into the lungs every 6 (six) hours as needed for shortness of breath or wheezing.   02/24/2021   atenolol (TENORMIN) 25 MG  tablet Take 1 tablet (25 mg total) by mouth daily. 90 tablet 3 02/26/2021 at 0445   empagliflozin (JARDIANCE) 10 MG TABS tablet Take 1 tablet (10 mg total) by mouth daily before breakfast. 90 tablet 0 02/25/2021   glipiZIDE (GLUCOTROL) 10 MG tablet Take 1 tablet (10 mg total) by mouth daily. 90 tablet 3 02/25/2021   hydrochlorothiazide (HYDRODIURIL) 25 MG tablet Take 25 mg by mouth daily.   02/25/2021   insulin glargine (LANTUS SOLOSTAR) 100 UNIT/ML Solostar Pen Inject 36 Units into the skin daily. For diabetes. 15 mL 0 02/26/2021 at 0615   levothyroxine (SYNTHROID) 100 MCG tablet Take 1 tablet (100 mcg total) by mouth daily before breakfast. 90 tablet 3 02/26/2021 at 0445   medroxyPROGESTERone (PROVERA) 10 MG tablet Take 1 tablet (10 mg total) by mouth in the morning and at bedtime. 60 tablet 0 02/25/2021   Melatonin 10 MG TABS Take 10 mg by mouth at bedtime.   02/24/2021   omeprazole (PRILOSEC) 40 MG capsule Take 1 capsule (40 mg total) by mouth 2 (two) times daily. 180 capsule 1 02/26/2021 at 0445   potassium chloride SA (KLOR-CON M20) 20 MEQ tablet TAKE 1 TABLET BY MOUTH EVERY DAY 30 tablet 1 02/25/2021   pregabalin (LYRICA) 50 MG capsule Take 1 capsule (50 mg  total) by mouth 2 (two) times daily. 60 capsule 0 02/26/2021 at 0445   Tiotropium Bromide Monohydrate 2.5 MCG/ACT AERS Inhale 2 puffs into the lungs daily.    3/78/5885 at 0277   TRULICITY 1.5 AJ/2.8NO SOPN INJECT 1.5 MG INTO THE SKIN ONCE A WEEK. 2 mL 1 Past Week   varenicline (CHANTIX CONTINUING MONTH PAK) 1 MG tablet Take 1 tablet (1 mg total) by mouth 2 (two) times daily. 60 tablet 0 02/24/2021   ACCU-CHEK GUIDE test strip Use to test blood sugar up to 2 times a day. 100 each 3    ALPRAZolam (XANAX) 0.25 MG tablet Take 1 tablet (0.25 mg total) by mouth daily as needed for anxiety. (Patient not taking: Reported on 02/19/2021) 15 tablet 0 Not Taking   ibuprofen (ADVIL) 800 MG tablet Take 800 mg by mouth every 8 (eight) hours as needed for moderate  pain.   More than a month   Insulin Pen Needle 31G X 5 MM MISC Use once daily to inject 10 units 90 each 3    loratadine (CLARITIN) 10 MG tablet Take 10 mg by mouth daily.      lovastatin (MEVACOR) 40 MG tablet Take 1 tablet (40 mg total) by mouth at bedtime. 90 tablet 3 More than a month   triamcinolone ointment (KENALOG) 0.5 % Apply 1 application topically 2 (two) times daily. (Patient not taking: No sig reported) 30 g 0 Unknown    Review of Systems  Constitutional: Negative.   Respiratory: Negative.    Cardiovascular: Negative.   Gastrointestinal: Negative.   Genitourinary: Negative.    Blood pressure (!) 146/72, pulse 85, temperature 98.5 F (36.9 C), temperature source Oral, resp. rate 18, height 5\' 7"  (1.702 m), weight 104.3 kg, SpO2 99 %. Physical Exam Constitutional:      Appearance: Normal appearance.  Cardiovascular:     Rate and Rhythm: Normal rate and regular rhythm.  Pulmonary:     Effort: Pulmonary effort is normal.     Breath sounds: Normal breath sounds.  Abdominal:     General: Bowel sounds are normal.     Palpations: Abdomen is soft.  Genitourinary:    Comments: Nl EGBUS, uterus small, < 10 weeks, mobile, no masses Neurological:     Mental Status: She is alert.    Results for orders placed or performed during the hospital encounter of 02/26/21 (from the past 24 hour(s))  Glucose, capillary     Status: Abnormal   Collection Time: 02/26/21  7:57 AM  Result Value Ref Range   Glucose-Capillary 153 (H) 70 - 99 mg/dL  Glucose, capillary     Status: Abnormal   Collection Time: 02/26/21 10:03 AM  Result Value Ref Range   Glucose-Capillary 144 (H) 70 - 99 mg/dL    No results found.  Assessment/Plan: Simple endometrial hyperplasia with atypia on polyp   For TVH with possible salpingectomy. R/B/Post op care have been reviewed and pt desires to proceed.   Chancy Milroy 02/26/2021, 10:21 AM

## 2021-02-26 NOTE — Op Note (Signed)
Lanisha Stepanian PROCEDURE DATE: 02/26/2021  PREOPERATIVE DIAGNOSIS:  Simple endometrial hyperplasia with atypia POSTOPERATIVE DIAGNOSIS:  SAA SURGEON:   Arlina Robes, M.D. Terrence DupontAmmie Ferrier, M.D.  An experienced assistant was required given the standard of surgical care given the complexity of the case.  This assistant was needed for exposure, dissection, suctioning, retraction, and for overall help during the procedure.   OPERATION:  Total Vaginal hysterectomy ANESTHESIA:  General endotracheal.  INDICATIONS: The patient is a 65 y.o. [id number] with history of symptomatic uterine fibroids/menorrhagia. The patient made a decision to undergo definite surgical treatment. On the preoperative visit, the risks, benefits, indications, and alternatives of the procedure were reviewed with the patient.  On the day of surgery, the risks of surgery were again discussed with the patient including but not limited to: bleeding which may require transfusion or reoperation; infection which may require antibiotics; injury to bowel, bladder, ureters or other surrounding organs; need for additional procedures; thromboembolic phenomenon, incisional problems and other postoperative/anesthesia complications. Written informed consent was obtained.    OPERATIVE FINDINGS: A 8 week size uterus with normal tubes and ovaries bilaterally.  ESTIMATED BLOOD LOSS: 100 ml FLUIDS:  As recorded URINE OUTPUT:  As recorded SPECIMENS:  Uterus and cervix sent to pathology COMPLICATIONS:  None immediate.  DESCRIPTION OF PROCEDURE:  The patient received intravenous antibiotics and had sequential compression devices applied to her lower extremities while in the preoperative area.  She was then taken to the operating room where general anesthesia was administered and was found to be adequate.  She was placed in the dorsal lithotomy position, and was prepped and draped in a sterile manner.  A Foley catheter was inserted into her bladder  and attached to constant drainage. After an adequate timeout was performed, attention was turned to her pelvis.  A weighted speculum was then placed in the vagina, and the posterior lip of the cervix were grasped with tenaculum. The posterior cul de sac was then entered sharply. Long bill weighted speculum was then placed. The anterior lip of the cervix was grasped.  The cervix was then circumferentially incised, and the bladder was dissected off the pubocervical fascia anteriorly without complication.  The anterior cul-de-sac was then entered sharply without difficulty and a retractor was placed.  Zeplin clamps were then used to clamp the uterosacral ligaments on either side.  They were then cut and sutured ligated with 0 Vicryl.  Of note, all sutures used in this case were 0 Vicryl unless otherwise noted.   The cardinal ligaments were then clamped, cut and ligated. The uterine vessels and broad ligaments were then serially clamped with the Zeplin clamps, cut, and suture ligated on both sides. The final pedicle involving the uteroovarian was then clamped, cut, secure with a free tie and a 0 Vicryl in [place] fashion. This was repeat on the opposite as well.  The specimen was then passed off to pathology. I was unable to bring the tubes down into the operative field safely for removal.  Excellent hemostasis was noted at this point.   After completion of the hysterectomy, all pedicles from the uterosacral ligament to the cornua were examined hemostasis was confirmed.  The peritoneum was closed in a purse string fashion with 2/0 Vicryl. The vaginal cuff was then closed with 2/0 Vicryl.  All instruments were then removed from the pelvis. The patient tolerated the procedure well.  All instruments, needles, and sponge counts were correct x 2. The patient was taken to the recovery room in  stable condition.    Arlina Robes, MD, [place] Attending [place], [facility]

## 2021-02-26 NOTE — Anesthesia Procedure Notes (Signed)
Procedure Name: Intubation Date/Time: 02/26/2021 10:44 AM Performed by: Yehuda Mao, CRNA Pre-anesthesia Checklist: Patient identified, Emergency Drugs available, Suction available and Patient being monitored Patient Re-evaluated:Patient Re-evaluated prior to induction Oxygen Delivery Method: Circle system utilized Preoxygenation: Pre-oxygenation with 100% oxygen Induction Type: IV induction Ventilation: Mask ventilation without difficulty Laryngoscope Size: Mac and 3 Grade View: Grade I Tube type: Oral Tube size: 7.0 mm Number of attempts: 1 Airway Equipment and Method: Stylet and Oral airway Placement Confirmation: ETT inserted through vocal cords under direct vision, positive ETCO2 and breath sounds checked- equal and bilateral Tube secured with: Tape Dental Injury: Teeth and Oropharynx as per pre-operative assessment

## 2021-02-26 NOTE — Anesthesia Postprocedure Evaluation (Signed)
Anesthesia Post Note  Patient: Valerie Lamb  Procedure(s) Performed: HYSTERECTOMY VAGINAL (Bilateral)     Patient location during evaluation: PACU Anesthesia Type: General Level of consciousness: awake and alert Pain management: pain level controlled Vital Signs Assessment: post-procedure vital signs reviewed and stable Respiratory status: spontaneous breathing, nonlabored ventilation, respiratory function stable and patient connected to nasal cannula oxygen Cardiovascular status: blood pressure returned to baseline and stable Postop Assessment: no apparent nausea or vomiting Anesthetic complications: no   No notable events documented.  Last Vitals:  Vitals:   02/26/21 1345 02/26/21 1400  BP: 112/65 105/60  Pulse: 67 66  Resp: 17 (!) 21  Temp:    SpO2: 96% 96%    Last Pain:  Vitals:   02/26/21 1400  TempSrc:   PainSc: [place]

## 2021-02-27 ENCOUNTER — Encounter (HOSPITAL_COMMUNITY): Payer: Self-pay | Admitting: Obstetrics and Gynecology

## 2021-02-27 DIAGNOSIS — J45909 Unspecified asthma, uncomplicated: Secondary | ICD-10-CM | POA: Diagnosis not present

## 2021-02-27 DIAGNOSIS — Z7984 Long term (current) use of oral hypoglycemic drugs: Secondary | ICD-10-CM | POA: Diagnosis not present

## 2021-02-27 DIAGNOSIS — I10 Essential (primary) hypertension: Secondary | ICD-10-CM | POA: Diagnosis not present

## 2021-02-27 DIAGNOSIS — Z79899 Other long term (current) drug therapy: Secondary | ICD-10-CM | POA: Diagnosis not present

## 2021-02-27 DIAGNOSIS — J449 Chronic obstructive pulmonary disease, unspecified: Secondary | ICD-10-CM | POA: Diagnosis not present

## 2021-02-27 DIAGNOSIS — Z794 Long term (current) use of insulin: Secondary | ICD-10-CM | POA: Diagnosis not present

## 2021-02-27 DIAGNOSIS — N8502 Endometrial intraepithelial neoplasia [EIN]: Secondary | ICD-10-CM | POA: Diagnosis not present

## 2021-02-27 DIAGNOSIS — E119 Type 2 diabetes mellitus without complications: Secondary | ICD-10-CM | POA: Diagnosis not present

## 2021-02-27 DIAGNOSIS — F17210 Nicotine dependence, cigarettes, uncomplicated: Secondary | ICD-10-CM | POA: Diagnosis not present

## 2021-02-27 DIAGNOSIS — E039 Hypothyroidism, unspecified: Secondary | ICD-10-CM | POA: Diagnosis not present

## 2021-02-27 LAB — BASIC METABOLIC PANEL
Anion gap: 7 (ref 5–15)
BUN: 13 mg/dL (ref 8–23)
CO2: 28 mmol/L (ref 22–32)
Calcium: 8.6 mg/dL — ABNORMAL LOW (ref 8.9–10.3)
Chloride: 104 mmol/L (ref 98–111)
Creatinine, Ser: 0.85 mg/dL (ref 0.44–1.00)
GFR, Estimated: >60 mL/min (ref >60)
Glucose, Bld: 101 mg/dL — ABNORMAL HIGH (ref 70–99)
Potassium: 2.7 mmol/L — CL (ref 3.5–5.1)
Sodium: 139 mmol/L (ref 135–145)

## 2021-02-27 LAB — CBC
HCT: 42.3 % (ref 36.0–46.0)
Hemoglobin: 14 g/dL (ref 12.0–15.0)
MCH: 29.5 pg (ref 26.0–34.0)
MCHC: 33.1 g/dL (ref 30.0–36.0)
MCV: 89.2 fL (ref 80.0–100.0)
Platelets: 170 10*3/uL (ref 150–400)
RBC: 4.74 MIL/uL (ref 3.87–5.11)
RDW: 14.2 % (ref 11.5–15.5)
WBC: 8.9 10*3/uL (ref 4.0–10.5)
nRBC: 0 % (ref 0.0–0.2)

## 2021-02-27 MED ORDER — IBUPROFEN 800 MG PO TABS: 800.0000 mg | ORAL_TABLET | Freq: Three times a day (TID) | ORAL | 1 refills | Status: DC

## 2021-02-27 MED ORDER — POTASSIUM CHLORIDE CRYS ER 20 MEQ PO TBCR: 20.0000 meq | EXTENDED_RELEASE_TABLET | Freq: Two times a day (BID) | ORAL | 1 refills | Status: DC

## 2021-02-27 MED ORDER — POTASSIUM CHLORIDE CRYS ER 20 MEQ PO TBCR
40.0000 meq | EXTENDED_RELEASE_TABLET | Freq: Once | ORAL | Status: AC
  Administered 2021-02-27: 40 meq via ORAL
  Filled 2021-02-27: qty 2

## 2021-02-27 MED ORDER — OXYCODONE-ACETAMINOPHEN 5-325 MG PO TABS: 1.0000 | ORAL_TABLET | ORAL | 0 refills | Status: DC | PRN

## 2021-02-27 NOTE — Discharge Summary (Signed)
Physician Discharge Summary  Patient ID: Valerie Lamb MRN: [id number] DOB/AGE: [birth year] 65 y.o.  Admit date: 02/26/2021 Discharge date: 02/27/2021  Admission Diagnoses: Simple endometrial hyperplasia  Discharge Diagnoses:  Active Problems:   Post-operative state   Simple endometrial hyperplasia with atypia   Discharged Condition: good  Hospital Course: Valerie Lamb was admitted with above Dx. She underwent a TVH without problems. See OP note for additional information. Post op course was unremarkable. Pt progressed to ambulating, tolerating diet, voiding, + flatus and good oral pain control. Felt amendable for discharge home. Discharge instructions, medications and follow up were reviewed with pt. Pt verbalized understanding and was discharge home  Consults: None  Significant Diagnostic Studies: labs  Treatments: surgery: TVH  Discharge Exam: Blood pressure (!) 115/58, pulse 80, temperature 98.2 F (36.8 C), temperature source Oral, resp. rate 16, height 5\' 7"  (1.702 m), weight 104.3 kg, SpO2 93 %. Lungs clear Heart RRR Abd soft + BS GU no bleeding Ext non tender  Disposition: Discharge disposition: 01-Home or Self Care       Discharge Instructions     Call MD for:  difficulty breathing, headache or visual disturbances   Complete by: As directed    Call MD for:  extreme fatigue   Complete by: As directed    Call MD for:  hives   Complete by: As directed    Call MD for:  persistant dizziness or light-headedness   Complete by: As directed    Call MD for:  persistant nausea and vomiting   Complete by: As directed    Call MD for:  redness, tenderness, or signs of infection (pain, swelling, redness, odor or green/yellow discharge around incision site)   Complete by: As directed    Call MD for:  severe uncontrolled pain   Complete by: As directed    Call MD for:  temperature >100.4   Complete by: As directed    Diet - low sodium heart healthy   Complete by: As  directed    Increase activity slowly   Complete by: As directed    Sexual Activity Restrictions   Complete by: As directed    Pelvic rest x 4 weeks      Allergies as of 02/27/2021       Reactions   Metformin Nausea And Vomiting        Medication List     STOP taking these medications    ALPRAZolam 0.25 MG tablet Commonly known as: XANAX   medroxyPROGESTERone 10 MG tablet Commonly known as: PROVERA   triamcinolone ointment 0.5 % Commonly known as: KENALOG       TAKE these medications    Accu-Chek Guide test strip Generic drug: glucose blood Use to test blood sugar up to 2 times a day.   albuterol 108 (90 Base) MCG/ACT inhaler Commonly known as: VENTOLIN HFA Inhale 1-2 puffs into the lungs every 6 (six) hours as needed for shortness of breath or wheezing.   atenolol 25 MG tablet Commonly known as: TENORMIN Take 1 tablet (25 mg total) by mouth daily.   empagliflozin 10 MG Tabs tablet Commonly known as: JARDIANCE Take 1 tablet (10 mg total) by mouth daily before breakfast.   glipiZIDE 10 MG tablet Commonly known as: GLUCOTROL Take 1 tablet (10 mg total) by mouth daily.   hydrochlorothiazide 25 MG tablet Commonly known as: HYDRODIURIL Take 25 mg by mouth daily.   ibuprofen 800 MG tablet Commonly known as: ADVIL Take 1 tablet (800 mg total)  by mouth every 8 (eight) hours. What changed:  when to take this reasons to take this   Insulin Pen Needle 31G X 5 MM Misc Use once daily to inject 10 units   Lantus SoloStar 100 UNIT/ML Solostar Pen Generic drug: insulin glargine Inject 36 Units into the skin daily. For diabetes.   levothyroxine 100 MCG tablet Commonly known as: SYNTHROID Take 1 tablet (100 mcg total) by mouth daily before breakfast.   loratadine 10 MG tablet Commonly known as: CLARITIN Take 10 mg by mouth daily.   lovastatin 40 MG tablet Commonly known as: MEVACOR Take 1 tablet (40 mg total) by mouth at bedtime.   Melatonin 10 MG  Tabs Take 10 mg by mouth at bedtime.   omeprazole 40 MG capsule Commonly known as: PRILOSEC Take 1 capsule (40 mg total) by mouth 2 (two) times daily.   oxyCODONE-acetaminophen 5-325 MG tablet Commonly known as: PERCOCET/ROXICET Take 1 tablet by mouth every 4 (four) hours as needed for moderate pain.   potassium chloride SA 20 MEQ tablet Commonly known as: KLOR-CON Take 1 tablet (20 mEq total) by mouth 2 (two) times daily for 5 days. What changed:  how much to take when to take this   pregabalin 50 MG capsule Commonly known as: Lyrica Take 1 capsule (50 mg total) by mouth 2 (two) times daily.   Tiotropium Bromide Monohydrate 2.5 MCG/ACT Aers Inhale 2 puffs into the lungs daily.   Trulicity 1.5 MB/8.4YK Sopn Generic drug: Dulaglutide INJECT 1.5 MG INTO THE SKIN ONCE A WEEK.   varenicline 1 MG tablet Commonly known as: Chantix Continuing Month Pak Take 1 tablet (1 mg total) by mouth 2 (two) times daily.        Follow-up Information     [facility]. Schedule an appointment as soon as possible for a visit in 4 week(s).   Why: 4 week post op appt, face to face with Dr. Gardiner Fanti Contact information: [address] [place] [address] [phone]                Signed: Chancy Milroy 02/27/2021, 8:32 AM

## 2021-02-27 NOTE — Plan of Care (Signed)

## 2021-03-06 LAB — SURGICAL PATHOLOGY

## 2021-03-08 ENCOUNTER — Other Ambulatory Visit: Payer: Self-pay | Admitting: Family Medicine

## 2021-03-08 DIAGNOSIS — G629 Polyneuropathy, unspecified: Secondary | ICD-10-CM

## 2021-03-09 ENCOUNTER — Encounter: Payer: Self-pay | Admitting: Radiology

## 2021-03-11 NOTE — Telephone Encounter (Signed)
Lyrica last filled 01-15-21 #60 Last OV 01-15-21 Next OV 03-19-21 [facility]

## 2021-03-13 ENCOUNTER — Encounter: Payer: Self-pay | Admitting: Family Medicine

## 2021-03-19 ENCOUNTER — Ambulatory Visit: Payer: Medicare Other | Admitting: Family Medicine

## 2021-03-22 ENCOUNTER — Other Ambulatory Visit: Payer: Self-pay | Admitting: Family Medicine

## 2021-03-22 DIAGNOSIS — R251 Tremor, unspecified: Secondary | ICD-10-CM

## 2021-03-22 DIAGNOSIS — E876 Hypokalemia: Secondary | ICD-10-CM

## 2021-03-22 DIAGNOSIS — E1122 Type 2 diabetes mellitus with diabetic chronic kidney disease: Secondary | ICD-10-CM

## 2021-03-22 DIAGNOSIS — N1831 Chronic kidney disease, stage 3a: Secondary | ICD-10-CM

## 2021-03-22 NOTE — Telephone Encounter (Signed)
Last office visit 01/15/2021 for DM.  Last refilled 02/27/2021 by Dr. Rip Harbour to take one tablets by mouth bid x 5 days.  #10 with 1 refill.  Refill request if for 1 tablet daily.  No future appointments with PCP.  Please advise on refill.

## 2021-03-25 ENCOUNTER — Encounter: Payer: Self-pay | Admitting: Obstetrics and Gynecology

## 2021-03-25 ENCOUNTER — Ambulatory Visit (INDEPENDENT_AMBULATORY_CARE_PROVIDER_SITE_OTHER): Payer: Medicare Other | Admitting: Obstetrics and Gynecology

## 2021-03-25 ENCOUNTER — Other Ambulatory Visit: Payer: Self-pay

## 2021-03-25 VITALS — BP 118/78 | HR 98 | Ht 67.0 in | Wt 223.1 lb

## 2021-03-25 DIAGNOSIS — Z90710 Acquired absence of both cervix and uterus: Secondary | ICD-10-CM

## 2021-03-25 DIAGNOSIS — Z23 Encounter for immunization: Secondary | ICD-10-CM | POA: Diagnosis not present

## 2021-03-25 DIAGNOSIS — Z9889 Other specified postprocedural states: Secondary | ICD-10-CM

## 2021-03-25 MED ORDER — POTASSIUM CHLORIDE CRYS ER 20 MEQ PO TBCR: 20.0000 meq | EXTENDED_RELEASE_TABLET | Freq: Every day | ORAL | 1 refills | Status: DC

## 2021-03-25 NOTE — Telephone Encounter (Signed)
Pt scheduled an appt Via mychart for 11/3

## 2021-03-25 NOTE — Progress Notes (Addendum)
Valerie Lamb presents for post op from [facility]. Pathology reviewed with pt. Occ mild cramp otherwise no complaints. Denies any bowel or bladder dysfunction.  PE AF VSS  Chaperone present during exam Lungs clear  Heart RRR Abd soft + BS GU cuff healing well, no masses or tenderness  A/P Post op visit        S/P TVH  Doing well return to normal ADL's as tolerates. F/U PRN or in 1 yr

## 2021-03-25 NOTE — Telephone Encounter (Signed)
Refill provided.   She needs repeat labs and an office visit next week to check-in on BP

## 2021-03-25 NOTE — Patient Instructions (Signed)
Health Maintenance, Female Adopting a healthy lifestyle and getting preventive care are important in promoting health and wellness. Ask your health care provider about: The right schedule for you to have regular tests and exams. Things you can do on your own to prevent diseases and keep yourself healthy. What should I know about diet, weight, and exercise? Eat a healthy diet  Eat a diet that includes plenty of vegetables, fruits, low-fat dairy products, and lean protein. Do not eat a lot of foods that are high in solid fats, added sugars, or sodium. Maintain a healthy weight Body mass index (BMI) is used to identify weight problems. It estimates body fat based on height and weight. Your health care provider can help determine your BMI and help you achieve or maintain a healthy weight. Get regular exercise Get regular exercise. This is one of the most important things you can do for your health. Most adults should: Exercise for at least 150 minutes each week. The exercise should increase your heart rate and make you sweat (moderate-intensity exercise). Do strengthening exercises at least twice a week. This is in addition to the moderate-intensity exercise. Spend less time sitting. Even light physical activity can be beneficial. Watch cholesterol and blood lipids Have your blood tested for lipids and cholesterol at 64 years of age, then have this test every 5 years. Have your cholesterol levels checked more often if: Your lipid or cholesterol levels are high. You are older than 64 years of age. You are at high risk for heart disease. What should I know about cancer screening? Depending on your health history and family history, you may need to have cancer screening at various ages. This may include screening for: Breast cancer. Cervical cancer. Colorectal cancer. Skin cancer. Lung cancer. What should I know about heart disease, diabetes, and high blood pressure? Blood pressure and heart  disease High blood pressure causes heart disease and increases the risk of stroke. This is more likely to develop in people who have high blood pressure readings, are of African descent, or are overweight. Have your blood pressure checked: Every 3-5 years if you are 18-39 years of age. Every year if you are 40 years old or older. Diabetes Have regular diabetes screenings. This checks your fasting blood sugar level. Have the screening done: Once every three years after age 40 if you are at a normal weight and have a low risk for diabetes. More often and at a younger age if you are overweight or have a high risk for diabetes. What should I know about preventing infection? Hepatitis B If you have a higher risk for hepatitis B, you should be screened for this virus. Talk with your health care provider to find out if you are at risk for hepatitis B infection. Hepatitis C Testing is recommended for: Everyone born from 1945 through 1965. Anyone with known risk factors for hepatitis C. Sexually transmitted infections (STIs) Get screened for STIs, including gonorrhea and chlamydia, if: You are sexually active and are younger than 64 years of age. You are older than 64 years of age and your health care provider tells you that you are at risk for this type of infection. Your sexual activity has changed since you were last screened, and you are at increased risk for chlamydia or gonorrhea. Ask your health care provider if you are at risk. Ask your health care provider about whether you are at high risk for HIV. Your health care provider may recommend a prescription medicine   to help prevent HIV infection. If you choose to take medicine to prevent HIV, you should first get tested for HIV. You should then be tested every 3 months for as long as you are taking the medicine. Pregnancy If you are about to stop having your period (premenopausal) and you may become pregnant, seek counseling before you get  pregnant. Take 400 to 800 micrograms (mcg) of folic acid every day if you become pregnant. Ask for birth control (contraception) if you want to prevent pregnancy. Osteoporosis and menopause Osteoporosis is a disease in which the bones lose minerals and strength with aging. This can result in bone fractures. If you are 65 years old or older, or if you are at risk for osteoporosis and fractures, ask your health care provider if you should: Be screened for bone loss. Take a calcium or vitamin D supplement to lower your risk of fractures. Be given hormone replacement therapy (HRT) to treat symptoms of menopause. Follow these instructions at home: Lifestyle Do not use any products that contain nicotine or tobacco, such as cigarettes, e-cigarettes, and chewing tobacco. If you need help quitting, ask your health care provider. Do not use street drugs. Do not share needles. Ask your health care provider for help if you need support or information about quitting drugs. Alcohol use Do not drink alcohol if: Your health care provider tells you not to drink. You are pregnant, may be pregnant, or are planning to become pregnant. If you drink alcohol: Limit how much you use to 0-1 drink a day. Limit intake if you are breastfeeding. Be aware of how much alcohol is in your drink. In the [place] one drink equals one 12 oz bottle of beer (355 mL), one 5 oz glass of wine (148 mL), or one 1 oz glass of hard liquor (44 mL). General instructions Schedule regular health, dental, and eye exams. Stay current with your vaccines. Tell your health care provider if: You often feel depressed. You have ever been abused or do not feel safe at home. Summary Adopting a healthy lifestyle and getting preventive care are important in promoting health and wellness. Follow your health care provider's instructions about healthy diet, exercising, and getting tested or screened for diseases. Follow your health care provider's  instructions on monitoring your cholesterol and blood pressure. This information is not intended to replace advice given to you by your health care provider. Make sure you discuss any questions you have with your health care provider. Document Revised: 07/27/2020 Document Reviewed: 05/12/2018 Elsevier Patient Education  2022 Elsevier Inc.  

## 2021-03-25 NOTE — Progress Notes (Signed)
Patient presents for a post op vaginal hysterectomy. Patient reports having some mild cramping, but no other concerns. Flu vaccine given today.

## 2021-04-04 ENCOUNTER — Ambulatory Visit: Payer: Medicare Other | Admitting: Family Medicine

## 2021-04-04 ENCOUNTER — Encounter: Payer: Self-pay | Admitting: Family Medicine

## 2021-04-04 ENCOUNTER — Telehealth: Payer: Self-pay

## 2021-04-04 NOTE — Chronic Care Management (AMB) (Addendum)
Chronic Care Management Pharmacy Assistant   Name: Valerie Lamb  MRN: [id number] DOB: 1955-06-17  Reason for Encounter: Diabetes Disease State   Recent office visits:  04/05/21-PCP- Telephone call regarding missed appointment.  Recent consult visits:  03/25/21-OB/GYN-Patient presented for follow up surgery,vaginal hysterectomy.Flu shot given.Return to [place] [facility]'s follow up 1 year.  Hospital visits:  02/26/21 thru 02/27/21-[facility]-Patient presented for hysterectomy vaginal- no admission  Medications: Outpatient Encounter Medications as of 04/04/2021  Medication Sig   ACCU-CHEK GUIDE test strip Use to test blood sugar up to 2 times a day.   albuterol (VENTOLIN HFA) 108 (90 Base) MCG/ACT inhaler Inhale 1-2 puffs into the lungs every 6 (six) hours as needed for shortness of breath or wheezing.   atenolol (TENORMIN) 25 MG tablet Take 1 tablet (25 mg total) by mouth daily.   glipiZIDE (GLUCOTROL) 10 MG tablet Take 1 tablet (10 mg total) by mouth daily.   hydrochlorothiazide (HYDRODIURIL) 25 MG tablet Take 25 mg by mouth daily.   ibuprofen (ADVIL) 800 MG tablet Take 1 tablet (800 mg total) by mouth every 8 (eight) hours.   insulin glargine (LANTUS SOLOSTAR) 100 UNIT/ML Solostar Pen Inject 36 Units into the skin daily. For diabetes.   Insulin Pen Needle 31G X 5 MM MISC Use once daily to inject 10 units   JARDIANCE 10 MG TABS tablet TAKE 1 TABLET BY MOUTH DAILY BEFORE BREAKFAST.   levothyroxine (SYNTHROID) 100 MCG tablet Take 1 tablet (100 mcg total) by mouth daily before breakfast.   lovastatin (MEVACOR) 40 MG tablet Take 1 tablet (40 mg total) by mouth at bedtime.   Melatonin 10 MG TABS Take 10 mg by mouth at bedtime.   omeprazole (PRILOSEC) 40 MG capsule TAKE 1 CAPSULE BY MOUTH TWICE A DAY   potassium chloride SA (KLOR-CON) 20 MEQ tablet Take 1 tablet (20 mEq total) by mouth daily.   pregabalin (LYRICA) 50 MG capsule TAKE 1 CAPSULE BY MOUTH 2 TIMES DAILY. (Patient  not taking: Reported on 82/11/154)   TRULICITY 1.5 FB/3.7HK SOPN INJECT 1.5 MG INTO THE SKIN ONCE A WEEK.   No facility-administered encounter medications on file as of 04/04/2021.     Recent Relevant Labs: Lab Results  Component Value Date/Time   HGBA1C 7.2 (A) 01/15/2021 08:23 AM   HGBA1C 9.3 (A) 10/09/2020 09:59 AM   HGBA1C 10.2 (H) 07/12/2020 12:40 PM   MICROALBUR 4.7 (H) 10/24/2020 11:07 AM    Kidney Function Lab Results  Component Value Date/Time   CREATININE 0.85 02/27/2021 06:28 AM   CREATININE 0.84 02/22/2021 11:00 AM   GFR 80.56 01/18/2021 01:23 PM   GFRNONAA >60 02/27/2021 06:28 AM     Attempted contact with Antony Odea 3 times on 04/10/21,04/15/21,04/18/21. Unsuccessful outreach. Will attempt contact next month.   Current antihyperglycemic regimen:  Jardiance 10 mg - 1 tablet daily Glipizide 10 mg - 1 tablet daily Lantus - 12 units daily in the morning Trulicity 1.5 mg - Inject once weekly (Tuesdays)   Adherence Review: Is the patient currently on a STATIN medication? Yes Is the patient currently on ACE/ARB medication? No Does the patient have >5 day gap between last estimated fill dates? No  Care Gaps: Annual wellness visit in last year? No Most recent A1C reading:  7.2 01/15/21 Most Recent BP reading:118/78  98-P  03/25/21  Last eye exam / retinopathy screening: overdue Last diabetic foot exam:10/24/20  Star Rating Drugs:  Medication:  Last Fill: Day Supply Trulicity 1.5mg /0.82ml 03/11/21 28 Jardiance  10mg  04/10/21 90 Lovastatin 40mg  04/07/21 90 Glipizide 10mg  03/02/21 90 Lantus   03/11/21 41   No appointments scheduled within the next 30 days.  Debbora Dus, CPP notified  Avel Sensor, [place] Assistant [phone]  I have reviewed the care management and care coordination activities outlined in this encounter and I am certifying that I agree with the content of this note. No further action required.  Debbora Dus,  PharmD Clinical Pharmacist [place] [facility] at [facility] [phone]

## 2021-04-05 NOTE — Telephone Encounter (Signed)
Called to schedule appt, call went straight to Valerie Lamb's voicemail  LMTCB to schedule

## 2021-04-16 ENCOUNTER — Other Ambulatory Visit: Payer: Self-pay | Admitting: Family Medicine

## 2021-04-16 DIAGNOSIS — R251 Tremor, unspecified: Secondary | ICD-10-CM

## 2021-04-17 ENCOUNTER — Other Ambulatory Visit: Payer: Self-pay | Admitting: Family Medicine

## 2021-04-17 DIAGNOSIS — E1169 Type 2 diabetes mellitus with other specified complication: Secondary | ICD-10-CM

## 2021-05-10 ENCOUNTER — Other Ambulatory Visit: Payer: Self-pay | Admitting: Family Medicine

## 2021-05-11 ENCOUNTER — Other Ambulatory Visit: Payer: Self-pay | Admitting: Family Medicine

## 2021-05-11 DIAGNOSIS — F321 Major depressive disorder, single episode, moderate: Secondary | ICD-10-CM

## 2021-05-11 DIAGNOSIS — R251 Tremor, unspecified: Secondary | ICD-10-CM

## 2021-05-11 DIAGNOSIS — L309 Dermatitis, unspecified: Secondary | ICD-10-CM

## 2021-05-11 DIAGNOSIS — Z720 Tobacco use: Secondary | ICD-10-CM

## 2021-05-11 DIAGNOSIS — N939 Abnormal uterine and vaginal bleeding, unspecified: Secondary | ICD-10-CM

## 2021-05-11 DIAGNOSIS — E1122 Type 2 diabetes mellitus with diabetic chronic kidney disease: Secondary | ICD-10-CM

## 2021-05-13 ENCOUNTER — Other Ambulatory Visit: Payer: Self-pay | Admitting: Primary Care

## 2021-05-13 DIAGNOSIS — E1122 Type 2 diabetes mellitus with diabetic chronic kidney disease: Secondary | ICD-10-CM

## 2021-05-21 ENCOUNTER — Telehealth: Payer: Self-pay

## 2021-05-21 DIAGNOSIS — E1122 Type 2 diabetes mellitus with diabetic chronic kidney disease: Secondary | ICD-10-CM

## 2021-05-21 MED ORDER — ACCU-CHEK FASTCLIX LANCETS MISC: 1.0000 | Freq: Two times a day (BID) | 1 refills | Status: DC

## 2021-05-21 NOTE — Chronic Care Management (AMB) (Addendum)
Chronic Care Management Pharmacy Assistant   Name: Valerie Lamb  MRN: [id number] DOB: [birth year]   Reason for Encounter:  Diabetes Disease State   Recent office visits:  None since last CCM contact  Recent consult visits:  None since last CCM contact  Hospital visits:  None in previous 6 months  Medications: Outpatient Encounter Medications as of 05/21/2021  Medication Sig   ACCU-CHEK GUIDE test strip Use to test blood sugar up to 2 times a day.   albuterol (VENTOLIN HFA) 108 (90 Base) MCG/ACT inhaler Inhale 1-2 puffs into the lungs every 6 (six) hours as needed for shortness of breath or wheezing.   atenolol (TENORMIN) 25 MG tablet Take 1 tablet (25 mg total) by mouth daily.   glipiZIDE (GLUCOTROL) 10 MG tablet Take 1 tablet (10 mg total) by mouth daily.   hydrochlorothiazide (HYDRODIURIL) 25 MG tablet Take 25 mg by mouth daily.   ibuprofen (ADVIL) 800 MG tablet Take 1 tablet (800 mg total) by mouth every 8 (eight) hours.   insulin glargine (LANTUS SOLOSTAR) 100 UNIT/ML Solostar Pen INJECT 36 UNITS INTO THE SKIN DAILY. FOR DIABETES.   Insulin Pen Needle 31G X 5 MM MISC Use once daily to inject 10 units   JARDIANCE 10 MG TABS tablet TAKE 1 TABLET BY MOUTH EVERY DAY BEFORE BREAKFAST   KLOR-CON M20 20 MEQ tablet TAKE 1 TABLET BY MOUTH EVERY DAY   levothyroxine (SYNTHROID) 100 MCG tablet Take 1 tablet (100 mcg total) by mouth daily before breakfast.   lovastatin (MEVACOR) 40 MG tablet Take 1 tablet (40 mg total) by mouth at bedtime.   Melatonin 10 MG TABS Take 10 mg by mouth at bedtime.   omeprazole (PRILOSEC) 40 MG capsule TAKE 1 CAPSULE BY MOUTH TWICE A DAY   pregabalin (LYRICA) 50 MG capsule TAKE 1 CAPSULE BY MOUTH 2 TIMES DAILY. (Patient not taking: Reported on 37/34/2876)   TRULICITY 1.5 OT/1.5BW SOPN INJECT 1.5 MG INTO THE SKIN ONCE A WEEK.   No facility-administered encounter medications on file as of 05/21/2021.     Recent Relevant Labs: Lab Results  Component  Value Date/Time   HGBA1C 7.2 (A) 01/15/2021 08:23 AM   HGBA1C 9.3 (A) 10/09/2020 09:59 AM   HGBA1C 10.2 (H) 07/12/2020 12:40 PM   MICROALBUR 4.7 (H) 10/24/2020 11:07 AM    Kidney Function Lab Results  Component Value Date/Time   CREATININE 0.85 02/27/2021 06:28 AM   CREATININE 0.84 02/22/2021 11:00 AM   GFR 80.56 01/18/2021 01:23 PM   GFRNONAA >60 02/27/2021 06:28 AM     Contacted patient on 05/21/21 to discuss diabetes disease state.   Current antihyperglycemic regimen:  Jardiance 10 mg - 1 tablet daily Lantus - 32 units daily in the morning Trulicity 1.5 mg - Inject once weekly (Wednesdays)    Patient verbally confirms she is taking the above medications as directed. Yes  What diet changes have been made to improve diabetes control? The patient reports she does eat sweets .  What recent interventions/DTPs have been made to improve glycemic control:   The patient is not using glipizide due to interaction with the insulin (this is patient report)   Have there been any recent hospitalizations or ED visits since last visit with CPP? No  Patient denies hypoglycemic symptoms, including Pale, Sweaty, Shaky, Hungry, Nervous/irritable, and Vision changes  Patient denies hyperglycemic symptoms, including blurry vision, excessive thirst, fatigue, polyuria, and weakness  How often are you checking your blood sugar? once daily  What  are your blood sugars ranging?  Fasting: patient reports ranging  105-130  During the week, how often does your blood glucose drop below 70? Never  Are you checking your feet daily/regularly? Yes  Adherence Review: Is the patient currently on a STATIN medication? Yes Is the patient currently on ACE/ARB medication? No Does the patient have >5 day gap between last estimated fill dates? No  Care Gaps: Annual wellness visit in last year? No Most recent A1C reading: 7.2  01/15/21 Most Recent BP reading: 118/78 98   Last eye exam / retinopathy  screening: none  Last diabetic foot exam: 10/24/20  Counseled patient on importance of annual eye and foot exam.   Star Rating Drugs:  Medication:  Last Fill: Day Supply Trulicity 1.5mg /0.80ml   03/11/21          28 Jardiance 10mg           04/10/21            90 Lovastatin 40mg          04/07/21            90 Glipizide 10mg             03/02/21            90 Lantus                         03/11/21          41    No appointments scheduled within the next 30 days.  The patient asked to order Accu-Chek fastclix lancets, (1 click lancing system with a drum) to be sent to [place], [place] [phone])  Debbora Dus, CPP notified  Avel Sensor, [place] Assistant [phone]  I have reviewed the care management and care coordination activities outlined in this encounter and I am certifying that I agree with the content of this note. See addendum for lancet refill.  Debbora Dus, PharmD Clinical Pharmacist [facility] [facility] at [facility] [phone]

## 2021-05-21 NOTE — Addendum Note (Signed)
Addended by: Debbora Dus on: 05/21/2021 02:36 PM   Modules accepted: Orders

## 2021-06-07 ENCOUNTER — Other Ambulatory Visit: Payer: Self-pay | Admitting: Family Medicine

## 2021-06-07 DIAGNOSIS — E1169 Type 2 diabetes mellitus with other specified complication: Secondary | ICD-10-CM

## 2021-06-10 ENCOUNTER — Encounter: Payer: Self-pay | Admitting: Family Medicine

## 2021-06-11 ENCOUNTER — Other Ambulatory Visit: Payer: Self-pay | Admitting: Family Medicine

## 2021-06-11 DIAGNOSIS — E1169 Type 2 diabetes mellitus with other specified complication: Secondary | ICD-10-CM

## 2021-06-12 ENCOUNTER — Other Ambulatory Visit: Payer: Self-pay | Admitting: Family Medicine

## 2021-06-12 DIAGNOSIS — E1169 Type 2 diabetes mellitus with other specified complication: Secondary | ICD-10-CM

## 2021-06-13 ENCOUNTER — Other Ambulatory Visit: Payer: Self-pay

## 2021-06-13 DIAGNOSIS — E1169 Type 2 diabetes mellitus with other specified complication: Secondary | ICD-10-CM

## 2021-06-13 MED ORDER — TRULICITY 1.5 MG/0.5ML ~~LOC~~ SOAJ: SUBCUTANEOUS | 1 refills | Status: DC

## 2021-06-13 NOTE — Telephone Encounter (Signed)
Spoke to patient and she would like her trulicity sent to [facility] instead. Resent rx as requested.

## 2021-06-14 ENCOUNTER — Telehealth: Payer: Self-pay

## 2021-06-14 ENCOUNTER — Encounter: Payer: Self-pay | Admitting: Family Medicine

## 2021-06-14 NOTE — Telephone Encounter (Signed)
Coordinated Trulicity delivery through [facility]. Patient was having a hard time getting it from [facility]).

## 2021-06-14 NOTE — Telephone Encounter (Signed)
Spoke with patient and let her know we can deliver Monday. Confirmed $0 copay. She was appreciative.

## 2021-06-15 NOTE — Telephone Encounter (Signed)
Thanks, Michelle!  

## 2021-06-17 ENCOUNTER — Encounter (INDEPENDENT_AMBULATORY_CARE_PROVIDER_SITE_OTHER): Payer: Self-pay

## 2021-06-17 ENCOUNTER — Encounter: Payer: Self-pay | Admitting: Family Medicine

## 2021-06-30 ENCOUNTER — Other Ambulatory Visit: Payer: Self-pay | Admitting: Family Medicine

## 2021-06-30 ENCOUNTER — Other Ambulatory Visit: Payer: Self-pay | Admitting: Primary Care

## 2021-06-30 DIAGNOSIS — E1122 Type 2 diabetes mellitus with diabetic chronic kidney disease: Secondary | ICD-10-CM

## 2021-06-30 DIAGNOSIS — E1169 Type 2 diabetes mellitus with other specified complication: Secondary | ICD-10-CM

## 2021-06-30 DIAGNOSIS — G629 Polyneuropathy, unspecified: Secondary | ICD-10-CM

## 2021-07-01 ENCOUNTER — Other Ambulatory Visit: Payer: Self-pay | Admitting: Family Medicine

## 2021-07-01 DIAGNOSIS — L309 Dermatitis, unspecified: Secondary | ICD-10-CM

## 2021-07-01 DIAGNOSIS — Z720 Tobacco use: Secondary | ICD-10-CM

## 2021-07-01 DIAGNOSIS — N939 Abnormal uterine and vaginal bleeding, unspecified: Secondary | ICD-10-CM

## 2021-07-01 DIAGNOSIS — E1169 Type 2 diabetes mellitus with other specified complication: Secondary | ICD-10-CM

## 2021-07-01 DIAGNOSIS — R251 Tremor, unspecified: Secondary | ICD-10-CM

## 2021-07-01 IMAGING — DX DG CHEST 2V
2 series · 2 of 2 positions shown · non-contrast
Comparison: None.

CLINICAL DATA: Preop

EXAM:
CHEST - 2 VIEW

[chest pa]
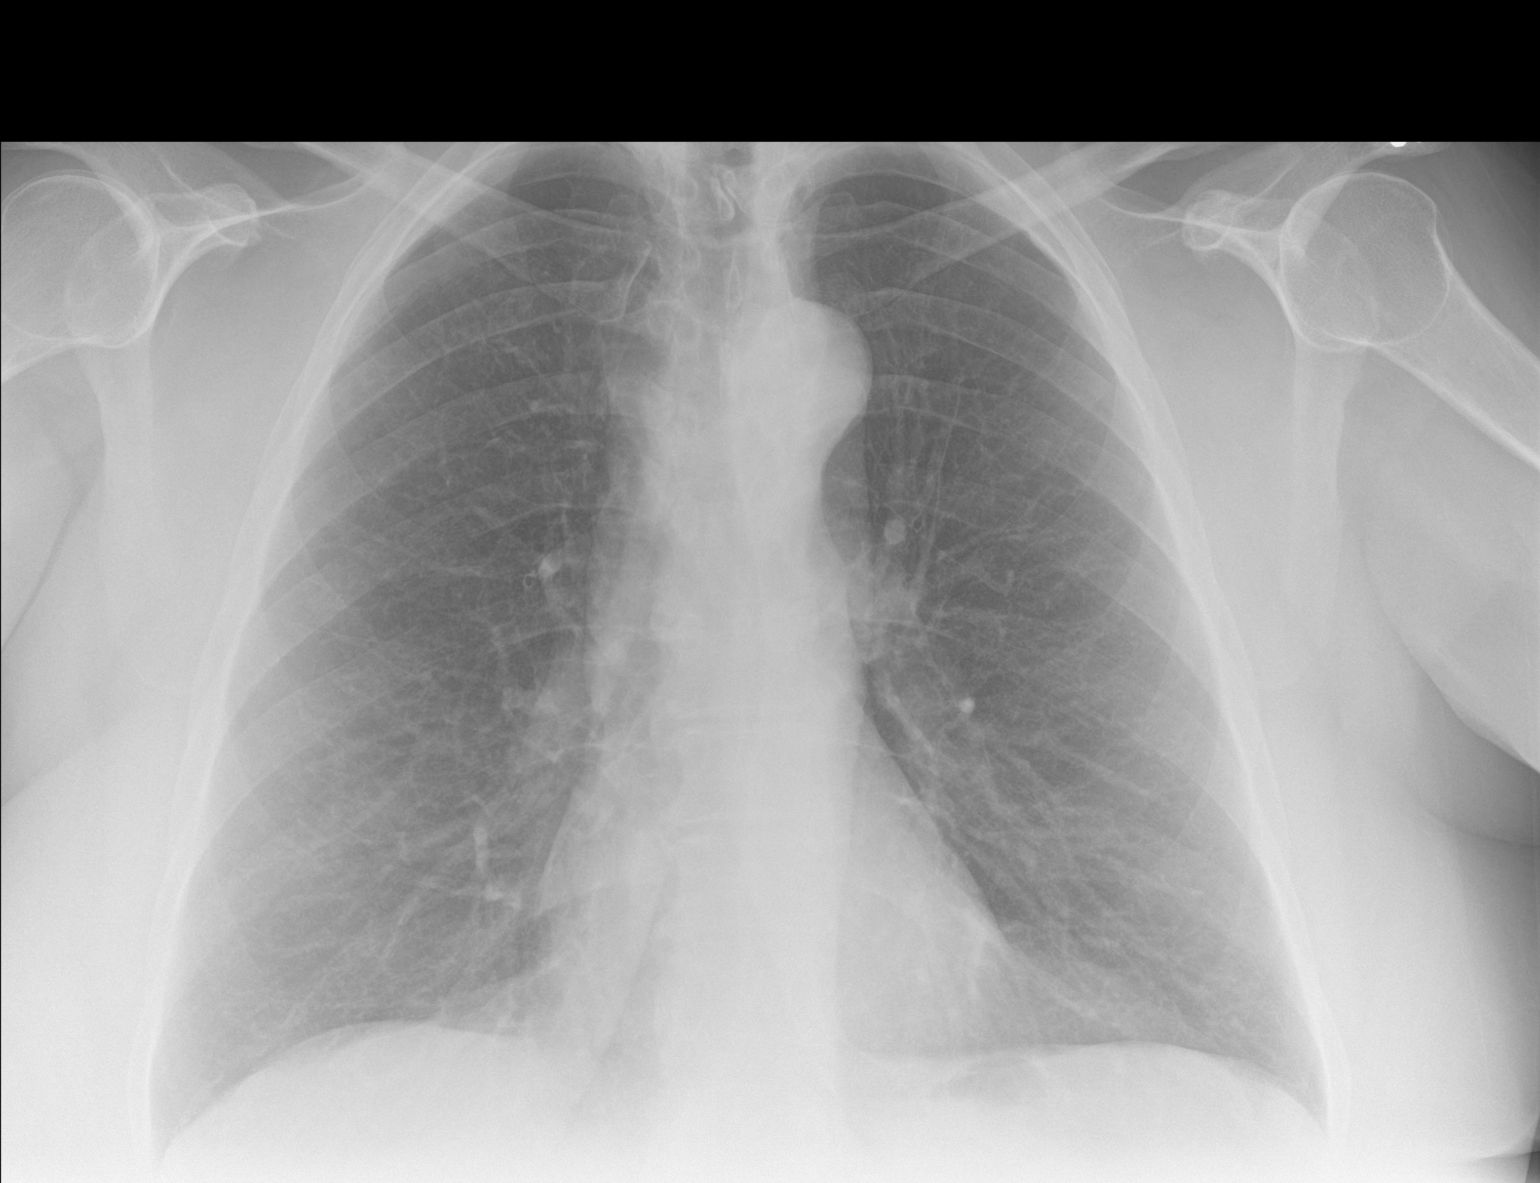

[chest lat]
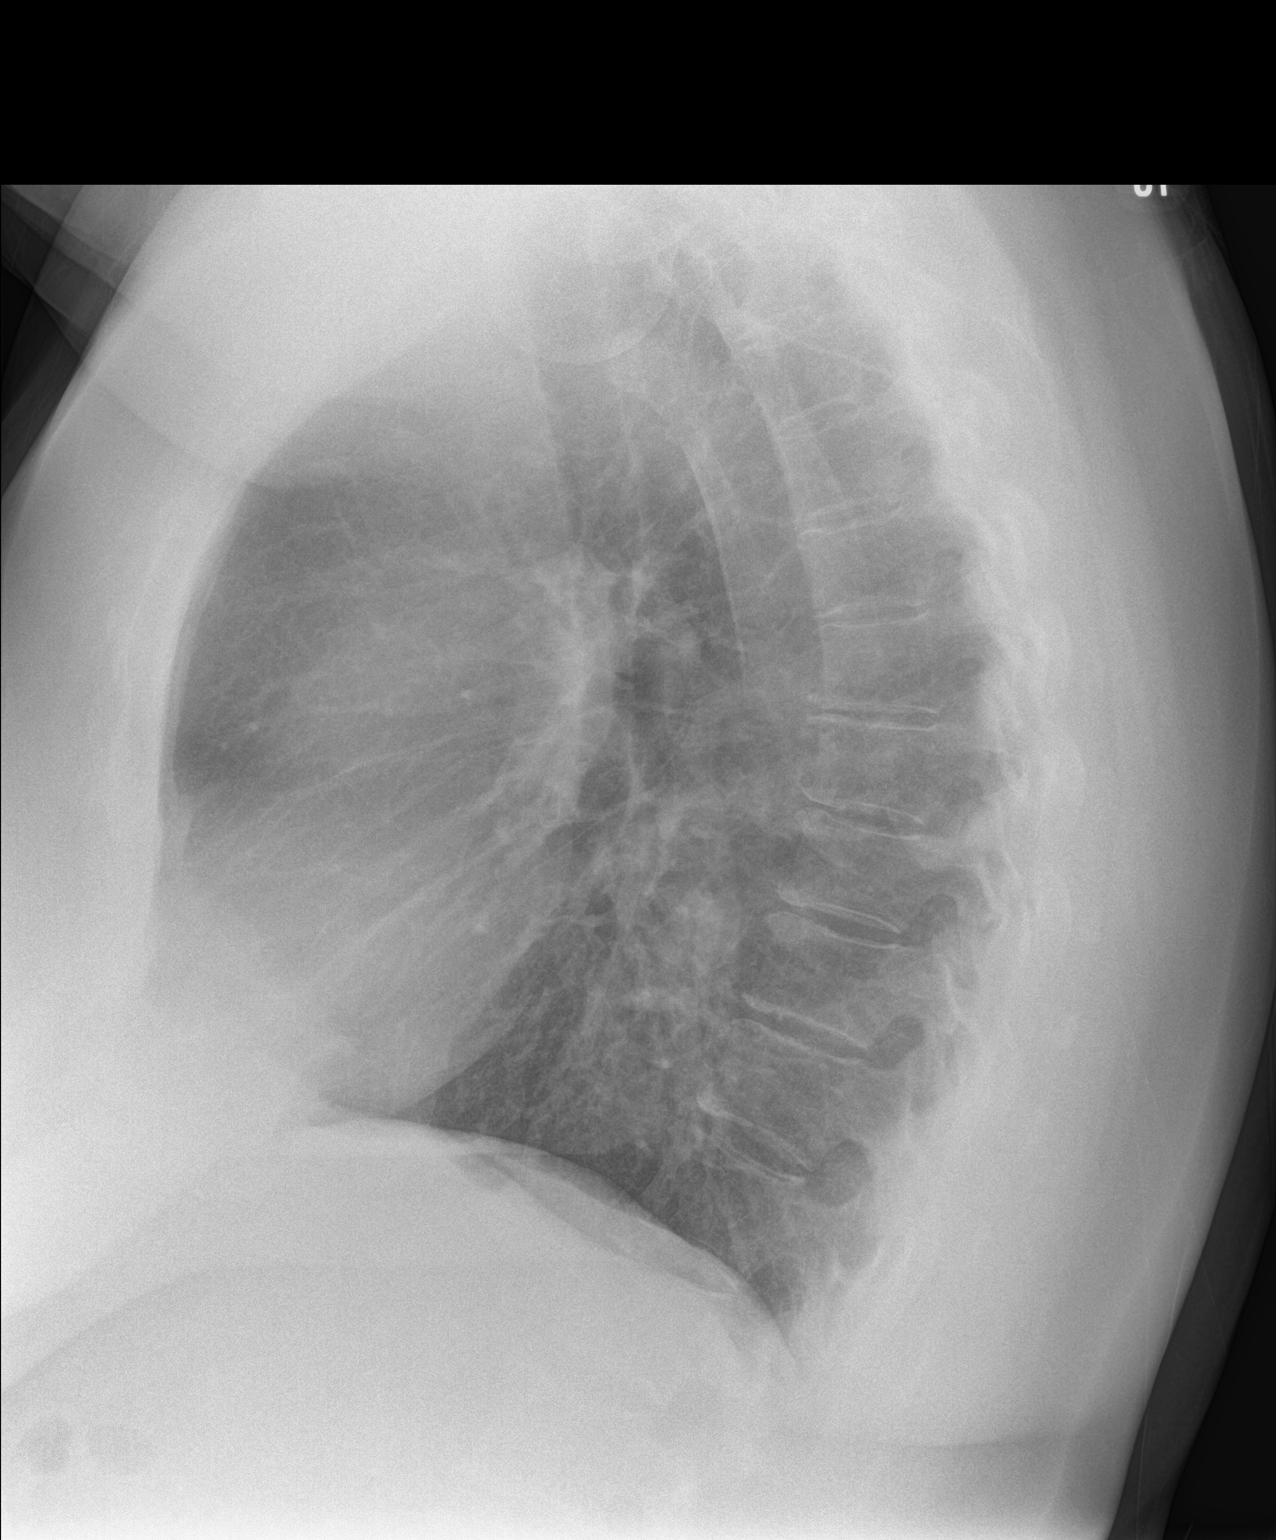

[2 of 2 positions shown; findings below may reference images not displayed]

FINDINGS: The heart size and mediastinal contours are within normal limits.
Both lungs are clear. The visualized skeletal structures are
unremarkable.
IMPRESSION: Negative.

## 2021-07-01 NOTE — Telephone Encounter (Signed)
Lyrica last filled on 03/11/21 #60 with 1 refill  Last OV 01-15-21 Next appointment on 07/22/21

## 2021-07-01 NOTE — Telephone Encounter (Signed)
Refills sent to pharmacy. 

## 2021-07-02 ENCOUNTER — Other Ambulatory Visit: Payer: Self-pay | Admitting: Family Medicine

## 2021-07-02 DIAGNOSIS — E1169 Type 2 diabetes mellitus with other specified complication: Secondary | ICD-10-CM

## 2021-07-04 DIAGNOSIS — E119 Type 2 diabetes mellitus without complications: Secondary | ICD-10-CM | POA: Diagnosis not present

## 2021-07-04 DIAGNOSIS — H35371 Puckering of macula, right eye: Secondary | ICD-10-CM | POA: Diagnosis not present

## 2021-07-04 LAB — HM DIABETES EYE EXAM

## 2021-07-09 ENCOUNTER — Ambulatory Visit: Payer: Medicare Other | Admitting: Family Medicine

## 2021-07-11 ENCOUNTER — Telehealth: Payer: Self-pay

## 2021-07-11 ENCOUNTER — Encounter: Payer: Self-pay | Admitting: Family Medicine

## 2021-07-11 NOTE — Progress Notes (Addendum)
Acute form uploaded for Trulicity 1.5mg  once weekly injection to be delivered to the patient from [facility] tomorrow 07/12/21  Debbora Dus, CPP notified  Avel Sensor, [place] Assistant [phone]

## 2021-07-11 NOTE — Telephone Encounter (Signed)
Patient left VM regarding refill of Trulicity. Medication will be delivered 07/12/21. Let patient know she can expect a call from [facility] tomorrow to confirm.

## 2021-07-11 NOTE — Telephone Encounter (Signed)
complete

## 2021-07-15 ENCOUNTER — Ambulatory Visit: Payer: Medicare Other | Admitting: Family Medicine

## 2021-07-22 ENCOUNTER — Ambulatory Visit (INDEPENDENT_AMBULATORY_CARE_PROVIDER_SITE_OTHER): Payer: Medicare Other | Admitting: Family Medicine

## 2021-07-22 ENCOUNTER — Other Ambulatory Visit: Payer: Self-pay

## 2021-07-22 VITALS — BP 110/62 | HR 69 | Temp 98.0°F | Ht 67.0 in | Wt 231.1 lb

## 2021-07-22 DIAGNOSIS — Z720 Tobacco use: Secondary | ICD-10-CM | POA: Diagnosis not present

## 2021-07-22 DIAGNOSIS — Z1159 Encounter for screening for other viral diseases: Secondary | ICD-10-CM | POA: Diagnosis not present

## 2021-07-22 DIAGNOSIS — E1122 Type 2 diabetes mellitus with diabetic chronic kidney disease: Secondary | ICD-10-CM

## 2021-07-22 DIAGNOSIS — E1169 Type 2 diabetes mellitus with other specified complication: Secondary | ICD-10-CM

## 2021-07-22 DIAGNOSIS — I152 Hypertension secondary to endocrine disorders: Secondary | ICD-10-CM

## 2021-07-22 DIAGNOSIS — J449 Chronic obstructive pulmonary disease, unspecified: Secondary | ICD-10-CM

## 2021-07-22 DIAGNOSIS — E1159 Type 2 diabetes mellitus with other circulatory complications: Secondary | ICD-10-CM | POA: Diagnosis not present

## 2021-07-22 DIAGNOSIS — Z114 Encounter for screening for human immunodeficiency virus [HIV]: Secondary | ICD-10-CM

## 2021-07-22 LAB — POCT GLYCOSYLATED HEMOGLOBIN (HGB A1C): Hemoglobin A1C: 7.5 % — AB (ref 4.0–5.6)

## 2021-07-22 MED ORDER — TRULICITY 3 MG/0.5ML ~~LOC~~ SOAJ: 3.0000 mg | SUBCUTANEOUS | 1 refills | Status: DC

## 2021-07-22 MED ORDER — VARENICLINE TARTRATE 0.5 MG PO TABS: ORAL_TABLET | ORAL | 0 refills | Status: AC

## 2021-07-22 MED ORDER — VARENICLINE TARTRATE 1 MG PO TABS: 1.0000 mg | ORAL_TABLET | Freq: Two times a day (BID) | ORAL | 2 refills | Status: DC

## 2021-07-22 MED ORDER — TIOTROPIUM BROMIDE MONOHYDRATE 2.5 MCG/ACT IN AERS: 2.0000 | INHALATION_SPRAY | Freq: Every day | RESPIRATORY_TRACT | 3 refills | Status: DC

## 2021-07-22 MED ORDER — LISINOPRIL 10 MG PO TABS: 10.0000 mg | ORAL_TABLET | Freq: Every day | ORAL | 1 refills | Status: DC

## 2021-07-22 NOTE — Assessment & Plan Note (Signed)
Blood pressure at goal however with persistent height low kalemia in the setting of hydrochlorothiazide.  At this point she is not already on an ACE inhibitor.  We will stop HCTZ and potassium supplement.  Start lisinopril 10 mg daily.  Continue atenolol 25 mg daily.  Return for labs in 2 weeks.  Update via MyChart with home blood pressure monitoring

## 2021-07-22 NOTE — Assessment & Plan Note (Signed)
Lab Results  Component Value Date   HGBA1C 7.5 (A) 07/22/2021  Stable but still not at goal.  Increase Trulicity 1.5 to 3 mg daily. Continue current Lantus dosing.  Patient interested in the possibility of coverage for continuous glucose monitoring as well as insulin discussed endocrinology referral to discuss further if this would be a good option for her.  Turn in 3 months for repeat hemoglobin A1c

## 2021-07-22 NOTE — Assessment & Plan Note (Signed)
Had success with Chantix in the past but an issue occurred with the prescription and was not able to finish course.  We will restart Chantix encourage cessation.

## 2021-07-22 NOTE — Assessment & Plan Note (Signed)
Refill of Spiriva.  Lungs clear.  Continue as needed albuterol.

## 2021-07-22 NOTE — Patient Instructions (Addendum)
#  Blood pressure - Stop HCTZ and potassium - Start lisinopril - Update with blood pressure readings in 2 weeks - return for labs in 2 weeks  #Diabetes - increase trulicity from 1.5 > 3 mg  - can finish current dose - endocrinology referral

## 2021-07-22 NOTE — Progress Notes (Signed)
Subjective:     Valerie Lamb is a 66 y.o. female presenting for Follow-up (DM./Requesting eye exam from [facility] ) and Medication Refill (Would like spiriva and chantix refill. Not on current med list. )     HPI  #Diabetes Currently taking insulin 36, trulicity 1.5 mg, jardiance  Using medications without difficulties: Yes Hypoglycemic episodes:No  Hyperglycemic episodes:No  Feet problems:No  Blood Sugars averaging: 140-180 Last HgbA1c:  Lab Results  Component Value Date   HGBA1C 7.5 (A) 07/22/2021   Diet: doing well overall, sometimes missing meals  Diabetes Health Maintenance Due:    Diabetes Health Maintenance Due  Topic Date Due   OPHTHALMOLOGY EXAM  Never done   FOOT EXAM  10/24/2021   URINE MICROALBUMIN  10/24/2021   HEMOGLOBIN A1C  01/19/2022    [place] - would like restart chantix - back up to 1/2 dose  #copd - needs spiriva refill - breathing stable  #hypokalemia - taking potassium supplement  #HTN - HCTZ - atenolol  - no hx of swelling   Review of Systems   Social History   Tobacco Use  Smoking Status Every Day   Packs/day: 0.25   Years: 46.00   Pack years: 11.50   Types: Cigarettes   Start date: 1973  Smokeless Tobacco Never  Tobacco Comments   2 ppd previously, cut back 2021        Objective:    BP Readings from Last 3 Encounters:  07/22/21 110/62  03/25/21 118/78  02/27/21 (!) 114/53   Wt Readings from Last 3 Encounters:  07/22/21 231 lb 2 oz (104.8 kg)  03/25/21 223 lb 1.6 oz (101.2 kg)  02/26/21 230 lb (104.3 kg)    BP 110/62    Pulse 69    Temp 98 F (36.7 C) (Oral)    Ht 5\' 7"  (1.702 m)    Wt 231 lb 2 oz (104.8 kg)    SpO2 91%    BMI 36.20 kg/m    Physical Exam Constitutional:      General: She is not in acute distress.    Appearance: She is well-developed. She is not diaphoretic.  HENT:     Right Ear: External ear normal.     Left Ear: External ear normal.     Nose: Nose normal.  Eyes:      Conjunctiva/sclera: Conjunctivae normal.  Cardiovascular:     Rate and Rhythm: Normal rate and regular rhythm.     Heart sounds: No murmur heard. Pulmonary:     Effort: Pulmonary effort is normal. No respiratory distress.     Breath sounds: Normal breath sounds. No wheezing.  Musculoskeletal:     Cervical back: Neck supple.  Skin:    General: Skin is warm and dry.     Capillary Refill: Capillary refill takes less than 2 seconds.  Neurological:     Mental Status: She is alert. Mental status is at baseline.  Psychiatric:        Mood and Affect: Mood normal.        Behavior: Behavior normal.          Assessment & Plan:   Problem List Items Addressed This Visit       Cardiovascular and Mediastinum   Hypertension associated with diabetes ([place])    Blood pressure at goal however with persistent height low kalemia in the setting of hydrochlorothiazide.  At this point she is not already on an ACE inhibitor.  We will stop HCTZ and potassium supplement.  Start lisinopril 10 mg daily.  Continue atenolol 25 mg daily.  Return for labs in 2 weeks.  Update via MyChart with home blood pressure monitoring      Relevant Medications   rosuvastatin (CRESTOR) 20 MG tablet   lisinopril (ZESTRIL) 10 MG tablet   Dulaglutide (TRULICITY) 3 FX/9.0WI SOPN   Other Relevant Orders   Basic metabolic panel     Respiratory   COPD (chronic obstructive pulmonary disease) (HCC)    Refill of Spiriva.  Lungs clear.  Continue as needed albuterol.      Relevant Medications   Tiotropium Bromide Monohydrate 2.5 MCG/ACT AERS   varenicline (CHANTIX) 0.5 MG tablet   varenicline (CHANTIX CONTINUING MONTH PAK) 1 MG tablet     Endocrine   Type 2 diabetes mellitus with diabetic chronic kidney disease ([place]) - Primary    Lab Results  Component Value Date   HGBA1C 7.5 (A) 07/22/2021  Stable but still not at goal.  Increase Trulicity 1.5 to 3 mg daily. Continue current Lantus dosing.  Patient interested in the  possibility of coverage for continuous glucose monitoring as well as insulin discussed endocrinology referral to discuss further if this would be a good option for her.  Turn in 3 months for repeat hemoglobin A1c       Relevant Medications   rosuvastatin (CRESTOR) 20 MG tablet   lisinopril (ZESTRIL) 10 MG tablet   Dulaglutide (TRULICITY) 3 OX/7.3ZH SOPN   Other Relevant Orders   POCT glycosylated hemoglobin (Hb A1C) (Completed)   Ambulatory referral to Endocrinology     Other   Tobacco abuse    Had success with Chantix in the past but an issue occurred with the prescription and was not able to finish course.  We will restart Chantix encourage cessation.      Relevant Medications   varenicline (CHANTIX) 0.5 MG tablet   varenicline (CHANTIX CONTINUING MONTH PAK) 1 MG tablet   Other Visit Diagnoses     Type 2 diabetes mellitus with other specified complication, without long-term current use of insulin (HCC)       Relevant Medications   rosuvastatin (CRESTOR) 20 MG tablet   lisinopril (ZESTRIL) 10 MG tablet   Dulaglutide (TRULICITY) 3 GD/9.2EQ SOPN   Screening for HIV (human immunodeficiency virus)       Relevant Orders   HIV Antibody (routine testing w rflx)   Need for hepatitis C screening test       Relevant Orders   Hepatitis C antibody        Return in about 3 months (around 10/19/2021) for diabetes.  Lesleigh Noe, MD  This visit occurred during the SARS-CoV-2 public health emergency.  Safety protocols were in place, including screening questions prior to the visit, additional usage of staff PPE, and extensive cleaning of exam room while observing appropriate contact time as indicated for disinfecting solutions.

## 2021-07-23 ENCOUNTER — Encounter: Payer: Self-pay | Admitting: Family Medicine

## 2021-07-24 ENCOUNTER — Telehealth: Payer: Self-pay

## 2021-07-24 NOTE — Telephone Encounter (Signed)
Patient called to inquire about coverage for omnipod. She would like to use it for insulin admin and glucose monitoring. I let her know its not typically covered in type II diabetes. There is an option on the omnipod website to review your coverage. I would recommend she start here or contact her insurance directly for coverage questions as I am not as familiar with this product.

## 2021-07-25 NOTE — Telephone Encounter (Signed)
Insulin pumps can be covered in Type 2 Diabetes, typically Medicare requires labwork with C-peptide and fasting BG < 225 drawn on same day. Patient has been referred to endocrine and they will help determine if insulin pump is reasonable for her. She will need to contact her insurance to see if there is a certain brand of insulin pump that is preferred (OmniPod, Medtronic etc.)

## 2021-07-25 NOTE — Progress Notes (Signed)
Called patient to inform her that Endocrinology will determine if and what kind of insulin pump is needed. I also advised patient to call her insurance company to see what kind they cover and recommend. Patient stated Endocrinology called her yesterday and she has her appointment set up for April.   [name], CPP notified  Marijean Niemann, [place] Clinical Pharmacy Assistant [phone]  Time Spent: 5 Minutes

## 2021-08-06 ENCOUNTER — Telehealth: Payer: Self-pay

## 2021-08-06 NOTE — Chronic Care Management (AMB) (Signed)
? ? ?Chronic Care Management ?Pharmacy Assistant  ? ?Name: Valerie Lamb  MRN: [id number] DOB: February 03, 1956 ? ? ?Reason for Encounter: Medication Adherence and Delivery Coordination  ?  ? ?Recent office visits:  ?07/22/21-PCP-Jessica Cody,MD-Patient presented for follow up diabetes. New A1c 7.5 Restart Chantix,refill spiriva,  ?We will stop HCTZ and potassium supplement.  Start lisinopril 10 mg daily-Increase Trulicity 1.5 to 3 mg daily. Continue current Lantus dosing-order future HIV labs. ? ?Recent consult visits:  ?None since last CCM contact ? ?Hospital visits:  ?None in previous 6 months ? ?Medications: ?Outpatient Encounter Medications as of 08/06/2021  ?Medication Sig  ? Accu-Chek FastClix Lancets MISC 1 Device by Does not apply route in the morning and at bedtime. Use as directed to check blood glucose twice daily  ? ACCU-CHEK GUIDE test strip Use to test blood sugar up to 2 times a day.  ? albuterol (VENTOLIN HFA) 108 (90 Base) MCG/ACT inhaler Inhale 1-2 puffs into the lungs every 6 (six) hours as needed for shortness of breath or wheezing.  ? atenolol (TENORMIN) 25 MG tablet Take 1 tablet (25 mg total) by mouth daily.  ? Dulaglutide (TRULICITY) 3 JM/4.2AS SOPN Inject 3 mg as directed once a week.  ? hydrochlorothiazide (HYDRODIURIL) 25 MG tablet Take 25 mg by mouth daily.  ? ibuprofen (ADVIL) 800 MG tablet Take 1 tablet (800 mg total) by mouth every 8 (eight) hours.  ? insulin glargine (LANTUS SOLOSTAR) 100 UNIT/ML Solostar Pen INJECT 36 UNITS INTO THE SKIN DAILY. FOR DIABETES.  ? Insulin Pen Needle 31G X 5 MM MISC Use once daily to inject 10 units  ? JARDIANCE 10 MG TABS tablet TAKE 1 TABLET BY MOUTH EVERY DAY BEFORE BREAKFAST  ? KLOR-CON M20 20 MEQ tablet TAKE 1 TABLET BY MOUTH EVERY DAY  ? levothyroxine (SYNTHROID) 100 MCG tablet Take 1 tablet (100 mcg total) by mouth daily before breakfast.  ? lisinopril (ZESTRIL) 10 MG tablet Take 1 tablet (10 mg total) by mouth daily.  ? Melatonin 10 MG TABS Take 10 mg by  mouth at bedtime.  ? omeprazole (PRILOSEC) 40 MG capsule TAKE 1 CAPSULE BY MOUTH TWICE A DAY  ? pregabalin (LYRICA) 50 MG capsule TAKE 1 CAPSULE BY MOUTH TWICE A DAY  ? rosuvastatin (CRESTOR) 20 MG tablet Take 20 mg by mouth at bedtime.  ? Tiotropium Bromide Monohydrate 2.5 MCG/ACT AERS Inhale 2 puffs into the lungs daily.  ? varenicline (CHANTIX CONTINUING MONTH PAK) 1 MG tablet Take 1 tablet (1 mg total) by mouth 2 (two) times daily.  ? ?No facility-administered encounter medications on file as of 08/06/2021.  ? ? ?BP Readings from Last 3 Encounters:  ?07/22/21 110/62  ?03/25/21 118/78  ?02/27/21 (!) 114/53  ?  ?Lab Results  ?Component Value Date  ? HGBA1C 7.5 (A) 07/22/2021  ?  ? ? ?Recent OV, Consult or Hospital visit:  PCP- 07/22/21 ?Recent medication changes indicated: Restart Chantix,refill spiriva,  ?We will stop HCTZ and potassium supplement.  Start lisinopril 10 mg daily-Increase Trulicity 1.5 to 3 mg daily.  ? ? ?Last adherence delivery date: Onboarding     ? ?Patient is due for next adherence delivery on: 08/23/21 ? ?Spoke with patient on 08/06/21 and reviewed medications. Patient denied the need for any medications at this time. ? ?This delivery to include: Vials  30 Days ?VIAL medications: ? None - Patient will start 3 mg trulicity 08/03/17 and has a box of 4 pens  ? ? ?Patient declined the following medications this  month: ?Trulicity '3mg'$ - Inject '3mg'$  into skin once weekly  ? ? ?Any concerns about your medications? No ? ?How often do you forget or accidentally miss a dose? Rarely ? ?Do you use a pillbox? Yes ? ?No refill request needed. ? ?Confirmed delivery date of 08/23/21, advised patient that pharmacy will contact them the morning of delivery. ? ?Recent blood pressure readings are as follows: no readings available  ? ? ?Recent blood glucose readings are as follows: ?Fasting:  averaging  103-119 ? ?Annual wellness visit in last year? No ?Most Recent BP reading:110/62  69-P  07/22/21 ? ?If Diabetic: ?Most  recent A1C reading:7.5  07/22/21 ?Last eye exam / retinopathy screening: UTD per chart ?Last diabetic foot exam:  UTD per chart ? ?[name], [facility]  CPP notified ? ?Vernie Piet, CCMA ?Cl[facility]cal Pharmacy Assistant ?[phone] ? ?Total time spent for month ?CPA: 40 min ?

## 2021-08-09 ENCOUNTER — Other Ambulatory Visit (INDEPENDENT_AMBULATORY_CARE_PROVIDER_SITE_OTHER): Payer: Medicare Other

## 2021-08-09 ENCOUNTER — Other Ambulatory Visit: Payer: Self-pay

## 2021-08-09 DIAGNOSIS — I152 Hypertension secondary to endocrine disorders: Secondary | ICD-10-CM | POA: Diagnosis not present

## 2021-08-09 DIAGNOSIS — E1159 Type 2 diabetes mellitus with other circulatory complications: Secondary | ICD-10-CM

## 2021-08-09 DIAGNOSIS — E039 Hypothyroidism, unspecified: Secondary | ICD-10-CM | POA: Diagnosis not present

## 2021-08-09 DIAGNOSIS — Z1159 Encounter for screening for other viral diseases: Secondary | ICD-10-CM

## 2021-08-09 DIAGNOSIS — Z114 Encounter for screening for human immunodeficiency virus [HIV]: Secondary | ICD-10-CM

## 2021-08-09 LAB — BASIC METABOLIC PANEL
BUN: 15 mg/dL (ref 6–23)
CO2: 26 mEq/L (ref 19–32)
Calcium: 9.6 mg/dL (ref 8.4–10.5)
Chloride: 105 mEq/L (ref 96–112)
Creatinine, Ser: 0.85 mg/dL (ref 0.40–1.20)
GFR: 72.38 mL/min (ref >60.00)
Glucose, Bld: 182 mg/dL — ABNORMAL HIGH (ref 70–99)
Potassium: 4 mEq/L (ref 3.5–5.1)
Sodium: 141 mEq/L (ref 135–145)

## 2021-08-09 LAB — TSH: TSH: 1.65 u[IU]/mL (ref 0.35–5.50)

## 2021-08-10 ENCOUNTER — Other Ambulatory Visit: Payer: Self-pay | Admitting: Primary Care

## 2021-08-10 DIAGNOSIS — G629 Polyneuropathy, unspecified: Secondary | ICD-10-CM

## 2021-08-12 LAB — HEPATITIS C ANTIBODY
Hepatitis C Ab: NONREACTIVE
SIGNAL TO CUT-OFF: <0.02 (ref <1.00)

## 2021-08-12 LAB — HIV ANTIBODY (ROUTINE TESTING W REFLEX): HIV 1&2 Ab, 4th Generation: NONREACTIVE

## 2021-08-13 ENCOUNTER — Encounter: Payer: Self-pay | Admitting: Family Medicine

## 2021-08-13 ENCOUNTER — Other Ambulatory Visit: Payer: Medicare Other

## 2021-08-13 DIAGNOSIS — E1159 Type 2 diabetes mellitus with other circulatory complications: Secondary | ICD-10-CM

## 2021-08-13 MED ORDER — LISINOPRIL 10 MG PO TABS: 10.0000 mg | ORAL_TABLET | Freq: Every day | ORAL | 1 refills | Status: DC

## 2021-08-13 MED ORDER — IBUPROFEN 800 MG PO TABS: 800.0000 mg | ORAL_TABLET | Freq: Three times a day (TID) | ORAL | 1 refills | Status: DC

## 2021-08-13 NOTE — Telephone Encounter (Signed)
I spoke with pt; pt last seen 07/22/21 and pt has been taking lisinopril 10 mg daily and atenolol 25 mg daily. Pt said she has been moving her room around and has misplaced her list of BP readings. Pt said averaging 118/69 but does not know heart rate. Pt said she had not had any high BP readings since on this BP regimen. No H/A,dizziness, CP,SOB or vision changes. Pt said also she had requested refill of ibuprofen 800 mg for leg pain and pt said Dr Einar Pheasant is aware of leg pain. [facility]. Sending note to Dr Einar Pheasant.(Pt does have # 30 refill available but pt wants 90 day supply. ) ?

## 2021-09-10 ENCOUNTER — Ambulatory Visit (INDEPENDENT_AMBULATORY_CARE_PROVIDER_SITE_OTHER): Payer: Medicare Other | Admitting: Internal Medicine

## 2021-09-10 ENCOUNTER — Other Ambulatory Visit: Payer: Self-pay | Admitting: Internal Medicine

## 2021-09-10 ENCOUNTER — Encounter: Payer: Self-pay | Admitting: Internal Medicine

## 2021-09-10 VITALS — BP 124/80 | HR 61 | Ht 67.0 in | Wt 235.0 lb

## 2021-09-10 DIAGNOSIS — Z794 Long term (current) use of insulin: Secondary | ICD-10-CM

## 2021-09-10 DIAGNOSIS — E1142 Type 2 diabetes mellitus with diabetic polyneuropathy: Secondary | ICD-10-CM | POA: Diagnosis not present

## 2021-09-10 DIAGNOSIS — E785 Hyperlipidemia, unspecified: Secondary | ICD-10-CM

## 2021-09-10 DIAGNOSIS — E1122 Type 2 diabetes mellitus with diabetic chronic kidney disease: Secondary | ICD-10-CM

## 2021-09-10 DIAGNOSIS — E1165 Type 2 diabetes mellitus with hyperglycemia: Secondary | ICD-10-CM | POA: Diagnosis not present

## 2021-09-10 MED ORDER — INSULIN PEN NEEDLE 31G X 5 MM MISC: 1.0000 | Freq: Every day | 3 refills | Status: DC

## 2021-09-10 MED ORDER — TRULICITY 3 MG/0.5ML ~~LOC~~ SOAJ: 3.0000 mg | SUBCUTANEOUS | 3 refills | Status: DC

## 2021-09-10 MED ORDER — LANTUS SOLOSTAR 100 UNIT/ML ~~LOC~~ SOPN: 20.0000 [IU] | PEN_INJECTOR | Freq: Every day | SUBCUTANEOUS | 4 refills | Status: DC

## 2021-09-10 MED ORDER — EMPAGLIFLOZIN 25 MG PO TABS: 25.0000 mg | ORAL_TABLET | Freq: Every day | ORAL | 3 refills | Status: DC

## 2021-09-10 MED ORDER — TRESIBA FLEXTOUCH 100 UNIT/ML ~~LOC~~ SOPN: 20.0000 [IU] | PEN_INJECTOR | Freq: Every day | SUBCUTANEOUS | 3 refills | Status: DC

## 2021-09-10 NOTE — Progress Notes (Signed)
?Name: Valerie Lamb  ?MRN/ DOB: [id number], Mar 15, 1956   ?Age/ Sex: 66 y.o., female   ? ?PCP: Lesleigh Noe, MD   ?Reason for Endocrinology Evaluation: Type 2 Diabetes Mellitus  ?   ?Date of Initial Endocrinology Visit: 09/10/2021   ? ? ?PATIENT IDENTIFIER: Valerie Lamb is a 66 y.o. female with a past medical history of HTN, COPD, T2DM, hypothyroidism. The patient presented for initial endocrinology clinic visit on 09/10/2021 for consultative assistance with her diabetes management.  ? ? ?HPI: ?Ms. Bayne was  ? ? ?Diagnosed with DM > 10 yrs  ?Prior Medications tried/Intolerance: Intolerant to metformin. She was on Glipizide with persistent hyperglycemia. Started insulin , Jardiance and trulicity in 8889 ?Currently checking blood sugars 1 x / day ?Hypoglycemia episodes : no              ?Hemoglobin A1c has ranged from 7.2% in 12/2020, peaking at 10.2% in 07/2020. ?Patient required assistance for hypoglycemia: no ?Patient has required hospitalization within the last 1 year from hyper or hypoglycemia:  ? ?In terms of diet, the patient eats 3-4 small meals a day ,  ? ?She had hysterectomy 03/2021  ? ?She would like to get off insulin  ? ? ?She has leg pains and feel like she is walking on pins. As well as shooting pain in the arms - She was told in [place] there was a question of MS ? ? ?Denies nausea, vomiting or diarrhea  ? ?HOME DIABETES REGIMEN: ?Trulicity 3 mg weekly ?Jardiance 10 mg daily ?Lantus 30 units daily (  ? ? ?Statin: Yes ?ACE-I/ARB: Yes ? ? ?GLUCOSE LOG:  ? ?90- 176 mg/dL  ? ? ?DIABETIC COMPLICATIONS: ?Microvascular complications:  ?Neuropathy  ?Denies: CKD, retinopathy  ?Last eye exam: Completed 07/2021 ? ?Macrovascular complications:  ? ?Denies: CAD, PVD, CVA ? ? ?PAST HISTORY: ?Past Medical History:  ?Past Medical History:  ?Diagnosis Date  ? Anemia   ? Anxiety   ? Asthma   ? COPD (chronic obstructive pulmonary disease) ([place])   ? Depression   ? Diabetes mellitus without complication ([place])   ?  Endometriosis   ? Gastric ulcer   ? GERD (gastroesophageal reflux disease)   ? Hypertension   ? Hypothyroidism   ? Sleep apnea   ? Thyroid disease   ? ?Past Surgical History:  ?Past Surgical History:  ?Procedure Laterality Date  ? HYSTEROSCOPY WITH D & C N/A 08/23/2020  ? Procedure: DILATATION AND CURETTAGE /HYSTEROSCOPY, polypectomy;  Surgeon: Homero Fellers, MD;  Location: [facility];  Service: Gynecology;  Laterality: N/A;  ? TUBAL LIGATION    ? VAGINAL HYSTERECTOMY Bilateral 02/26/2021  ? Procedure: HYSTERECTOMY VAGINAL;  Surgeon: Chancy Milroy, MD;  Location: [facility];  Service: Gynecology;  Laterality: Bilateral;  ?  ?Social History:  reports that she has been smoking cigarettes. She started smoking about 50 years ago. She has a 11.50 pack-year smoking history. She has never used smokeless tobacco. She reports that she does not drink alcohol and does not use drugs. ?Family History:  ?Family History  ?Problem Relation Age of Onset  ? Alcohol abuse Mother   ? Arthritis Mother   ? Asthma Mother   ? COPD Mother   ? Depression Mother   ? Drug abuse Mother   ? Early death Mother   ? Hyperlipidemia Mother   ? Hypertension Mother   ? Alcohol abuse Father   ? Cancer Father   ? ? ? ?HOME MEDICATIONS: ?Allergies as of 09/10/2021   ? ?  Reactions  ? Metformin Nausea And Vomiting  ? ?  ? ?  ?Medication List  ?  ? ?  ? Accurate as of September 10, 2021 10:25 AM. If you have any questions, ask your nurse or doctor.  ?  ?  ? ?  ? ?Accu-Chek FastClix Lancets Misc ?1 Device by Does not apply route in the morning and at bedtime. Use as directed to check blood glucose twice daily ?  ?Accu-Chek Guide test strip ?Generic drug: glucose blood ?Use to test blood sugar up to 2 times a day. ?  ?albuterol 108 (90 Base) MCG/ACT inhaler ?Commonly known as: VENTOLIN HFA ?Inhale 1-2 puffs into the lungs every 6 (six) hours as needed for shortness of breath or wheezing. ?  ?atenolol 25 MG tablet ?Commonly known as: TENORMIN ?Take 1 tablet (25  mg total) by mouth daily. ?  ?hydrochlorothiazide 25 MG tablet ?Commonly known as: HYDRODIURIL ?Take 25 mg by mouth daily. ?  ?ibuprofen 800 MG tablet ?Commonly known as: ADVIL ?Take 1 tablet (800 mg total) by mouth every 8 (eight) hours. ?  ?Insulin Pen Needle 31G X 5 MM Misc ?Use once daily to inject 10 units ?  ?Jardiance 10 MG Tabs tablet ?Generic drug: empagliflozin ?TAKE 1 TABLET BY MOUTH EVERY DAY BEFORE BREAKFAST ?  ?Klor-Con M20 20 MEQ tablet ?Generic drug: potassium chloride SA ?TAKE 1 TABLET BY MOUTH EVERY DAY ?  ?Lantus SoloStar 100 UNIT/ML Solostar Pen ?Generic drug: insulin glargine ?INJECT 36 UNITS INTO THE SKIN DAILY. FOR DIABETES. ?What changed: how much to take ?  ?levothyroxine 100 MCG tablet ?Commonly known as: SYNTHROID ?Take 1 tablet (100 mcg total) by mouth daily before breakfast. ?  ?lisinopril 10 MG tablet ?Commonly known as: ZESTRIL ?Take 1 tablet (10 mg total) by mouth daily. ?  ?Melatonin 10 MG Tabs ?Take 10 mg by mouth at bedtime. ?  ?omeprazole 40 MG capsule ?Commonly known as: PRILOSEC ?TAKE 1 CAPSULE BY MOUTH TWICE A DAY ?  ?pregabalin 50 MG capsule ?Commonly known as: LYRICA ?TAKE 1 CAPSULE BY MOUTH TWICE A DAY ?  ?rosuvastatin 20 MG tablet ?Commonly known as: CRESTOR ?Take 20 mg by mouth at bedtime. ?  ?Tiotropium Bromide Monohydrate 2.5 MCG/ACT Aers ?Inhale 2 puffs into the lungs daily. ?  ?Trulicity 3 XV/4.0GQ Sopn ?Generic drug: Dulaglutide ?Inject 3 mg as directed once a week. ?  ?varenicline 1 MG tablet ?Commonly known as: Chantix Continuing Month Pak ?Take 1 tablet (1 mg total) by mouth 2 (two) times daily. ?  ? ?  ? ? ? ?ALLERGIES: ?Allergies  ?Allergen Reactions  ? Metformin Nausea And Vomiting  ? ? ? ?REVIEW OF SYSTEMS: ?A comprehensive ROS was conducted with the patient and is negative except as per HPI  ? ?  ?OBJECTIVE:  ? ?VITAL SIGNS: BP 124/80 (BP Location: Left Arm, Patient Position: Sitting, Cuff Size: Large)   Pulse 61   Ht '5\' 7"'$  (1.702 m)   Wt 235 lb (106.6 kg)    SpO2 95%   BMI 36.81 kg/m?   ? ?PHYSICAL EXAM:  ?General: Pt appears well and is in NAD  ?Neck: General: Supple without adenopathy or carotid bruits. ?Thyroid: Thyroid size normal.  No goiter or nodules appreciated.   ?Lungs: Clear with good BS bilat with no rales, rhonchi, or wheezes  ?Heart: RRR with normal S1 and S2 and no gallops; no murmurs; no rub  ?Abdomen: Normoactive bowel sounds, soft, nontender, without masses or organomegaly palpable  ?Extremities:  ?Lower extremities -  No pretibial edema. No lesions.  ?Neuro: MS is good with appropriate affect, pt is alert and Ox3  ? ? ?DM foot exam: 09/10/2021 ? ?The skin of the feet is intact without sores or ulcerations. ?The pedal pulses are 2+ on right and 2+ on left. ?The sensation is intact to a screening 5.07, 10 gram monofilament bilaterally ? ? ?DATA REVIEWED: ? ?Lab Results  ?Component Value Date  ? HGBA1C 7.5 (A) 07/22/2021  ? HGBA1C 7.2 (A) 01/15/2021  ? HGBA1C 9.3 (A) 10/09/2020  ? ?Lab Results  ?Component Value Date  ? MICROALBUR 4.7 (H) 10/24/2020  ? [place] 67 01/15/2021  ? CREATININE 0.85 08/09/2021  ? ?Lab Results  ?Component Value Date  ? MICRALBCREAT 2.8 10/24/2020  ? ? ?Lab Results  ?Component Value Date  ? CHOL 139 01/15/2021  ? HDL 33.60 (L) 01/15/2021  ? [place] 67 01/15/2021  ? TRIG 194.0 (H) 01/15/2021  ? CHOLHDL 4 01/15/2021  ?     ? ?ASSESSMENT / PLAN / RECOMMENDATIONS:  ? ?1) Type 2 Diabetes Mellitus, Sub-Optimally controlled, With Neuropathic  complications - Most recent A1c of 7.5 %. Goal A1c <7.0%. ? ?Plan: ?GENERAL: ?I have discussed with the patient the pathophysiology of diabetes. We stressed the importance of lifestyle changes including diet and exercise. I explained the complications associated with diabetes including retinopathy, nephropathy, neuropathy as well as increased risk of cardiovascular disease. We went over the benefit seen with glycemic control.  ? ?I explained to the patient that diabetic patients are at higher than  normal risk for amputations.  ?Intolerant to Metformin  ?The patient has held her basal insulin with BG< 100 mg/dL, and she has noted that her BG's remained below 150 mg/DL, I did explain to the patient that it is

## 2021-09-10 NOTE — Patient Instructions (Signed)
-   Increase Jardiance 25 mg , 1 tablet daily  ?- Continue Trulicity 3 mg weekly  ?- Decrease Lantus to 20 units daily  ? ? ? ? ?HOW TO TREAT LOW BLOOD SUGARS (Blood sugar LESS THAN 70 MG/DL) ?Please follow the RULE OF 15 for the treatment of hypoglycemia treatment (when your (blood sugars are less than 70 mg/dL)  ? ?STEP 1: Take 15 grams of carbohydrates when your blood sugar is low, which includes:  ?3-4 GLUCOSE TABS  OR ?3-4 OZ OF JUICE OR REGULAR SODA OR ?ONE TUBE OF GLUCOSE GEL   ? ?STEP 2: RECHECK blood sugar in 15 MINUTES ?STEP 3: If your blood sugar is still low at the 15 minute recheck --> then, go back to STEP 1 and treat AGAIN with another 15 grams of carbohydrates. ? ?

## 2021-09-13 ENCOUNTER — Telehealth: Payer: Self-pay

## 2021-09-13 DIAGNOSIS — Z720 Tobacco use: Secondary | ICD-10-CM

## 2021-09-13 DIAGNOSIS — R251 Tremor, unspecified: Secondary | ICD-10-CM

## 2021-09-13 DIAGNOSIS — J449 Chronic obstructive pulmonary disease, unspecified: Secondary | ICD-10-CM

## 2021-09-13 DIAGNOSIS — E1159 Type 2 diabetes mellitus with other circulatory complications: Secondary | ICD-10-CM

## 2021-09-13 NOTE — Chronic Care Management (AMB) (Addendum)
? ? ?Chronic Care Management ?Pharmacy Assistant  ? ?Name: Valerie Lamb  MRN: [id number] DOB: 1956/05/05 ? ? ?Reason for Encounter: Medication Adherence and Delivery Coordination ?  ? ?Recent office visits:  ?None since last CCM contact ? ?Recent consult visits:  ?09/10/21- Endocrinology-Valerie Shamleffer,MD-Initial Consult Diabetes,Recommend reducing her insulin at this time but continuing to take it daily-recommend changing to [name] and increasing  Jardiance  '25mg'$  one tablet daily- ?(Patient taking differently and appreciates the evaluation, declines to change medications at this time .) ? ?Hospital visits:  ?None in previous 6 months ? ?Medications: ?Outpatient Encounter Medications as of 09/13/2021  ?Medication Sig  ? Accu-Chek FastClix Lancets MISC 1 Device by Does not apply route in the morning and at bedtime. Use as directed to check blood glucose twice daily  ? ACCU-CHEK GUIDE test strip Use to test blood sugar up to 2 times a day.  ? albuterol (VENTOLIN HFA) 108 (90 Base) MCG/ACT inhaler Inhale 1-2 puffs into the lungs every 6 (six) hours as needed for shortness of breath or wheezing.  ? atenolol (TENORMIN) 25 MG tablet Take 1 tablet (25 mg total) by mouth daily.  ? Dulaglutide (TRULICITY) 3 KN/3.9JQ SOPN Inject 3 mg as directed once a week.  ? empagliflozin (JARDIANCE) 25 MG TABS tablet Take 1 tablet (25 mg total) by mouth daily before breakfast.  ? hydrochlorothiazide (HYDRODIURIL) 25 MG tablet Take 25 mg by mouth daily.  ? ibuprofen (ADVIL) 800 MG tablet Take 1 tablet (800 mg total) by mouth every 8 (eight) hours.  ? insulin degludec (TRESIBA FLEXTOUCH) 100 UNIT/ML FlexTouch Pen Inject 20 Units into the skin daily.  ? Insulin Pen Needle 31G X 5 MM MISC Inject 1 Device into the skin daily in the afternoon. Use once daily to inject 10 units  ? KLOR-CON M20 20 MEQ tablet TAKE 1 TABLET BY MOUTH EVERY DAY  ? levothyroxine (SYNTHROID) 100 MCG tablet Take 1 tablet (100 mcg total) by mouth daily before  breakfast.  ? lisinopril (ZESTRIL) 10 MG tablet Take 1 tablet (10 mg total) by mouth daily.  ? Melatonin 10 MG TABS Take 10 mg by mouth at bedtime.  ? omeprazole (PRILOSEC) 40 MG capsule TAKE 1 CAPSULE BY MOUTH TWICE A DAY  ? pregabalin (LYRICA) 50 MG capsule TAKE 1 CAPSULE BY MOUTH TWICE A DAY  ? rosuvastatin (CRESTOR) 20 MG tablet Take 20 mg by mouth at bedtime.  ? Tiotropium Bromide Monohydrate 2.5 MCG/ACT AERS Inhale 2 puffs into the lungs daily.  ? varenicline (CHANTIX CONTINUING MONTH PAK) 1 MG tablet Take 1 tablet (1 mg total) by mouth 2 (two) times daily.  ? ?No facility-administered encounter medications on file as of 09/13/2021.  ? ?BP Readings from Last 3 Encounters:  ?09/10/21 124/80  ?07/22/21 110/62  ?03/25/21 118/78  ?  ?Lab Results  ?Component Value Date  ? HGBA1C 7.5 (A) 07/22/2021  ?  ? ? ?Recent OV, Consult or Hospital visit:  ?Recent medication changes indicated:  Endocrinology-adding [name] and increasing Jardiance, patient declines.  ? ? ?Last adherence delivery date:08/23/21     ? ?Patient is due for next adherence delivery on: 09/23/21 ? ?Spoke with patient on 09/13/21 reviewed medications and coordinated delivery. ? ?This delivery to include: Vials  30 Days  ? ?VIAL medications: ?Trulicity '3mg'$  - Inject once week ?Lantus solostar  100 unit/ml-  inject 20 units daily  ? ? ?Patient declined the following medications this month: ?none ? ? ?Any concerns about your medications? No ? ?How often  do you forget or accidentally miss a dose? Never ? ?Is patient in packaging No ? ? ?Refills requested from providers include: Lantus ? ?Confirmed delivery date of 09/23/21, advised patient that pharmacy will contact them the morning of delivery.  Will be asking [facility] to move up delivery date by 2 days due to patient will be running out  ? ? ?Recent blood glucose readings are as follows: ?Fasting:  ranging 108-121 ? ? ?Annual wellness visit in last year? No ?Most Recent BP reading:124/80  61-P ?If  Diabetic: ?Most recent A1C reading:7.5  07/22/21 ?Last eye exam / retinopathy screening: UTD per chart ?Last diabetic foot exam:  UTD per chart ? ?Valerie Lamb, CPP notified ? ?Valerie Lamb, CCMA ?Health concierge  ?[phone]  ?

## 2021-09-17 ENCOUNTER — Ambulatory Visit (INDEPENDENT_AMBULATORY_CARE_PROVIDER_SITE_OTHER): Payer: Medicare Other | Admitting: Pharmacist

## 2021-09-17 DIAGNOSIS — E1159 Type 2 diabetes mellitus with other circulatory complications: Secondary | ICD-10-CM

## 2021-09-17 DIAGNOSIS — E1122 Type 2 diabetes mellitus with diabetic chronic kidney disease: Secondary | ICD-10-CM

## 2021-09-17 DIAGNOSIS — E1165 Type 2 diabetes mellitus with hyperglycemia: Secondary | ICD-10-CM

## 2021-09-17 DIAGNOSIS — J449 Chronic obstructive pulmonary disease, unspecified: Secondary | ICD-10-CM

## 2021-09-17 DIAGNOSIS — Z720 Tobacco use: Secondary | ICD-10-CM

## 2021-09-17 DIAGNOSIS — I152 Hypertension secondary to endocrine disorders: Secondary | ICD-10-CM

## 2021-09-17 DIAGNOSIS — Z794 Long term (current) use of insulin: Secondary | ICD-10-CM

## 2021-09-17 DIAGNOSIS — E1169 Type 2 diabetes mellitus with other specified complication: Secondary | ICD-10-CM

## 2021-09-17 MED ORDER — DEXCOM G6 TRANSMITTER MISC: 3 refills | Status: DC

## 2021-09-17 MED ORDER — DEXCOM G6 SENSOR MISC: 3 refills | Status: DC

## 2021-09-17 MED ORDER — DEXCOM G6 RECEIVER DEVI: 0 refills | Status: DC

## 2021-09-17 MED ORDER — LANTUS SOLOSTAR 100 UNIT/ML ~~LOC~~ SOPN: 20.0000 [IU] | PEN_INJECTOR | Freq: Every day | SUBCUTANEOUS | 1 refills | Status: DC

## 2021-09-17 NOTE — Progress Notes (Signed)
? ?Chronic Care Management ?Pharmacy Note ? ?09/27/2021 ?Name:  Valerie Lamb MRN:  [id number] DOB:  28-Oct-1955 ? ?Summary: CCM F/U visit ?-Pt established with Endocrine 09/10/21 (Dr Kelton Pillar), Lantus was switched to [place] increased to 25 mg; pt has not changed insulin and is somewhat confused, she wants to go back to taking Lantus as it was cheaper; she also would rather Dr Einar Pheasant continues to manage her diabetes. She is very interested in starting CGM (Dexcom) ?-Pt has cut back to 3 cigarettes/day with help of Chantix (started 07/2021) ?-Pt would like to consolidate all of her medications at [facility] ? ?Recommendations/Changes made from today's visit: ?-Continue Lantus 20 units daily. D/C [place]. ?-Ordered Dexcom G6 through [facility] ?-Utilize [facility] pharmacy for medication synchronization, packaging and delivery ? ?Plan: ?-[place] will call patient 2 months for DM update ?-Pharmacist follow up televisit scheduled for 3 months ?-PCP F/U 10/17/21 ? ? ? ?Subjective: ?Valerie Lamb is an 66 y.o. year old female who is a primary patient of Cody, Jobe Marker, MD.  The CCM team was consulted for assistance with disease management and care coordination needs.   ? ?Engaged with patient by telephone for follow up visit in response to provider referral for pharmacy case management and/or care coordination services.  ? ?Consent to Services:  ?The patient was given information about Chronic Care Management services, agreed to services, and gave verbal consent prior to initiation of services.  Please see initial visit note for detailed documentation.  ? ?Patient Care Team: ?Lesleigh Noe, MD as PCP - General (Family Medicine) ?Matvey Llanas, Cleaster Corin, [facility] as Pharmacist (Pharmacist) ? ?Recent office visits: ?07/22/21 Dr Einar Pheasant OV: chronic f/u; stop HCTZ and Kcl. Start lisinopril 10 mg. Repeat labs 2 weeks. Refill Spiriva. Increase Trulicity to 3 mg. Interested in CGM. Restart Chantix. RTC 3  months. ? ?Recent consult visits: ?09/10/21 Dr Kelton Pillar (Endocrine): initial visit - increase Jardiance to 25 mg. Decrease Lantus to 20 units (switched to [name]) ? ?Hospital visits: ?None in previous 6 months ? ? ?Objective: ? ?Lab Results  ?Component Value Date  ? CREATININE 0.85 08/09/2021  ? BUN 15 08/09/2021  ? GFR 72.38 08/09/2021  ? GFRNONAA >60 02/27/2021  ? NA 141 08/09/2021  ? K 4.0 08/09/2021  ? CALCIUM 9.6 08/09/2021  ? CO2 26 08/09/2021  ? GLUCOSE 182 (H) 08/09/2021  ? ? ?Lab Results  ?Component Value Date/Time  ? HGBA1C 7.5 (A) 07/22/2021 10:13 AM  ? HGBA1C 7.2 (A) 01/15/2021 08:23 AM  ? HGBA1C 10.2 (H) 07/12/2020 12:40 PM  ? GFR 72.38 08/09/2021 08:57 AM  ? GFR 80.56 01/18/2021 01:23 PM  ? MICROALBUR 4.7 (H) 10/24/2020 11:07 AM  ?  ?Last diabetic Eye exam:  ?Lab Results  ?Component Value Date/Time  ? HMDIABEYEEXA No Retinopathy 07/04/2021 12:00 AM  ?  ?Last diabetic Foot exam: No results found for: HMDIABFOOTEX  ? ?Lab Results  ?Component Value Date  ? CHOL 139 01/15/2021  ? HDL 33.60 (L) 01/15/2021  ? [place] 66 01/15/2021  ? TRIG 194.0 (H) 01/15/2021  ? CHOLHDL 4 01/15/2021  ? ? ? ?  Latest Ref Rng & Units 01/15/2021  ?  8:56 AM 07/12/2020  ? 12:40 PM  ?Hepatic Function  ?Total Protein 6.0 - 8.3 g/dL 6.7   7.1    ?Albumin 3.5 - 5.2 g/dL 3.9   4.1    ?AST 0 - 37 U/L 18   27    ?ALT 0 - 35 U/L 15  27    ?Alk Phosphatase 39 - 117 U/L 54   73    ?Total Bilirubin 0.2 - 1.2 mg/dL 0.7   0.8    ? ? ?Lab Results  ?Component Value Date/Time  ? TSH 1.65 08/09/2021 08:57 AM  ? TSH 2.33 05/07/2020 09:19 AM  ? FREET4 0.94 05/07/2020 09:19 AM  ? ? ? ?  Latest Ref Rng & Units 02/27/2021  ?  6:28 AM 02/22/2021  ? 11:00 AM 01/15/2021  ?  8:56 AM  ?CBC  ?WBC 4.0 - 10.5 K/uL 8.9   7.9   7.8    ?Hemoglobin 12.0 - 15.0 g/dL 14.0   17.8   16.6    ?Hematocrit 36.0 - 46.0 % 42.3   53.5   48.6    ?Platelets 150 - 400 K/uL 170   222   176.0    ? ? ?No results found for: VD25OH ? ?Clinical ASCVD: No  ?The ASCVD Risk score  (Arnett DK, et al., 2019) failed to calculate for the following reasons: ?  Unable to determine if patient is Non-Hispanic African American   ? ? ?  07/22/2021  ? 10:13 AM 01/15/2021  ?  8:17 AM 10/09/2020  ? 11:18 AM  ?Depression screen PHQ 2/9  ?Decreased Interest 2 3 1   ?Down, Depressed, Hopeless 2 3 2   ?PHQ - 2 Score 4 6 3   ?Altered sleeping 2 3 3   ?Tired, decreased energy 2 3 3   ?Change in appetite 1 3 1   ?Feeling bad or failure about yourself  1 1 1   ?Trouble concentrating 3 3 2   ?Moving slowly or fidgety/restless 0 0 0  ?Suicidal thoughts 0 0 0  ?PHQ-9 Score 13 19 13   ?Difficult doing work/chores Somewhat difficult Very difficult Somewhat difficult  ?  ? ?Social History  ? ?Tobacco Use  ?Smoking Status Every Day  ? Packs/day: 0.25  ? Years: 46.00  ? Pack years: 11.50  ? Types: Cigarettes  ? Start date: 70  ?Smokeless Tobacco Never  ?Tobacco Comments  ? 2 ppd previously, cut back 2021  ? ?BP Readings from Last 3 Encounters:  ?09/10/21 124/80  ?07/22/21 110/62  ?03/25/21 118/78  ? ?Pulse Readings from Last 3 Encounters:  ?09/10/21 61  ?07/22/21 69  ?03/25/21 98  ? ?Wt Readings from Last 3 Encounters:  ?09/10/21 235 lb (106.6 kg)  ?07/22/21 231 lb 2 oz (104.8 kg)  ?03/25/21 223 lb 1.6 oz (101.2 kg)  ? ?BMI Readings from Last 3 Encounters:  ?09/10/21 36.81 kg/m?  ?07/22/21 36.20 kg/m?  ?03/25/21 34.94 kg/m?  ? ? ?Assessment/Interventions: Review of patient past medical history, allergies, medications, health status, including review of consultants reports, laboratory and other test data, was performed as part of comprehensive evaluation and provision of chronic care management services.  ? ?SDOH:  (Social Determinants of Health) assessments and interventions performed: Yes ?SDOH Interventions   ? ?Flowsheet Row Most Recent Value  ?SDOH Interventions   ?Food Insecurity Interventions Intervention Not Indicated  ? ?  ? ?SDOH Screenings  ? ?Alcohol Screen: Not on file  ?Depression (PHQ2-9): Medium Risk  ? PHQ-2 Score:  13  ?Financial Resource Strain: Low Risk   ? Difficulty of Paying Living Expenses: Not very hard  ?Food Insecurity: No Food Insecurity  ? Worried About Charity fundraiser in the Last Year: Never true  ? Ran Out of Food in the Last Year: Never true  ?Housing: Not on file  ?Physical Activity: Not on file  ?Social  Connections: Not on file  ?Stress: Not on file  ?Tobacco Use: High Risk  ? Smoking Tobacco Use: Every Day  ? Smokeless Tobacco Use: Never  ? Passive Exposure: Not on file  ?Transportation Needs: Not on file  ? ? ?[place] ? ?Allergies  ?Allergen Reactions  ? Metformin Nausea And Vomiting  ? ? ?Medications Reviewed Today   ? ? Reviewed by Charlton Haws, RPH (Pharmacist) on 09/27/21 at 1145  Med List Status: <None>  ? ?Medication Order Taking? Sig Documenting Provider Last Dose Status Informant  ?Accu-Chek FastClix Lancets MISC [id number] Yes 1 Device by Does not apply route in the morning and at bedtime. Use as directed to check blood glucose twice daily Lesleigh Noe, MD Taking Active   ?ACCU-CHEK GUIDE test strip [id number] Yes Use to test blood sugar up to 2 times a day. Lesleigh Noe, MD Taking Active Self  ?albuterol (VENTOLIN HFA) 108 (90 Base) MCG/ACT inhaler [id number] Yes Inhale 1-2 puffs into the lungs every 6 (six) hours as needed for shortness of breath or wheezing. [provider] Taking Active Self  ?atenolol (TENORMIN) 25 MG tablet [id number] Yes Take 1 tablet (25 mg total) by mouth daily. Lesleigh Noe, MD Taking Active   ?Continuous Blood Gluc Receiver ([place]) DEVI [id number]  1 each by Does not apply route daily. Lesleigh Noe, MD  Active   ?Continuous Blood Gluc Sensor ([place]) MISC [id number]  Apply sensor every 10 days as directed Lesleigh Noe, MD  Active   ?Dulaglutide (TRULICITY) 3 KX/3.8HW SOPN [id number] Yes Inject 3 mg as directed once a week. Shamleffer, Melanie Crazier, MD Taking Active   ?empagliflozin (JARDIANCE) 25 MG TABS tablet  [id number] Yes Take 1 tablet (25 mg total) by mouth daily before breakfast. Shamleffer, Melanie Crazier, MD Taking Active   ?ibuprofen (ADVIL) 800 MG tablet [id number] Yes Take 1 tablet (800 mg total) by mouth e

## 2021-09-19 MED ORDER — LEVOTHYROXINE SODIUM 100 MCG PO TABS
100.0000 ug | ORAL_TABLET | Freq: Every day | ORAL | 0 refills | Status: DC
Start: 2021-09-19 — End: 2022-03-04

## 2021-09-19 MED ORDER — LISINOPRIL 10 MG PO TABS: 10.0000 mg | ORAL_TABLET | Freq: Every day | ORAL | 0 refills | Status: DC

## 2021-09-19 MED ORDER — TIOTROPIUM BROMIDE MONOHYDRATE 2.5 MCG/ACT IN AERS: 2.0000 | INHALATION_SPRAY | Freq: Every day | RESPIRATORY_TRACT | 0 refills | Status: DC

## 2021-09-19 MED ORDER — OMEPRAZOLE 40 MG PO CPDR: 40.0000 mg | DELAYED_RELEASE_CAPSULE | Freq: Two times a day (BID) | ORAL | 0 refills | Status: DC

## 2021-09-19 MED ORDER — VARENICLINE TARTRATE 1 MG PO TABS: 1.0000 mg | ORAL_TABLET | Freq: Two times a day (BID) | ORAL | 0 refills | Status: DC

## 2021-09-19 MED ORDER — ATENOLOL 25 MG PO TABS: 25.0000 mg | ORAL_TABLET | Freq: Every day | ORAL | 0 refills | Status: DC

## 2021-09-19 NOTE — Telephone Encounter (Signed)
Patient has decided to transfer her pharmacy to [facility] for enhanced services including med sync and delivery. ? ?She needs all medications refilled to [facility]. ? ?Last PCP OV 07/22/21. Next OV 10/17/21. Will send 90 ds of maintenance meds to get patient started at [facility]. ? ? ? ? ?

## 2021-09-19 NOTE — Addendum Note (Signed)
Addended by: Charlton Haws on: 09/19/2021 05:06 PM ? ? Modules accepted: Orders ? ?

## 2021-09-20 ENCOUNTER — Encounter: Payer: Self-pay | Admitting: Family Medicine

## 2021-09-20 DIAGNOSIS — E1122 Type 2 diabetes mellitus with diabetic chronic kidney disease: Secondary | ICD-10-CM

## 2021-09-20 MED ORDER — DEXCOM G7 SENSOR MISC: 1.0000 | Freq: Every day | 0 refills | Status: DC | PRN

## 2021-09-20 MED ORDER — DEXCOM G7 RECEIVER DEVI: 1.0000 | Freq: Every day | 0 refills | Status: DC

## 2021-09-21 ENCOUNTER — Other Ambulatory Visit: Payer: Self-pay | Admitting: Family Medicine

## 2021-09-21 DIAGNOSIS — E1122 Type 2 diabetes mellitus with diabetic chronic kidney disease: Secondary | ICD-10-CM

## 2021-09-21 DIAGNOSIS — E1169 Type 2 diabetes mellitus with other specified complication: Secondary | ICD-10-CM

## 2021-09-23 ENCOUNTER — Telehealth: Payer: Self-pay | Admitting: Pharmacist

## 2021-09-23 DIAGNOSIS — E1122 Type 2 diabetes mellitus with diabetic chronic kidney disease: Secondary | ICD-10-CM

## 2021-09-23 MED ORDER — DEXCOM G7 SENSOR MISC: 2 refills | Status: DC

## 2021-09-23 NOTE — Telephone Encounter (Signed)
Received message from [facility] regarding Dexcom G7 - it is covered but they need Rx for 3 sensors at a time, since 1 sensor lasts 10 days. Re-ordered Rx with new quantity. ?

## 2021-09-27 NOTE — Patient Instructions (Signed)
Visit Information ? ?Phone number for Pharmacist: [phone] ? ? Goals Addressed   ?None ?  ? ? ?Care Plan : [name]  ?Updates made by Valerie Lamb, Valerie Lamb since 09/27/2021 12:00 AM  ?  ? ?Problem: Hypertension, Hyperlipidemia, Diabetes, COPD, and Tobacco use   ?Priority: High  ?  ? ?Long-Range Goal: Disease Managament   ?Start Date: 10/31/2020  ?Expected End Date: 09/28/2022  ?This Visit's Progress: On track  ?Priority: High  ?Note:   ?Current Barriers:  ?Unable to achieve control of DM  ?Suboptimal pharmacy services ? ?Pharmacist Clinical Goal(s):  ?Patient will achieve control of DM as evidenced by improved A1c through collaboration with PharmD and provider.  ?Utilize [facility] for medication synchronization, packaging and delivery ? ?Interventions: ?1:1 collaboration with Valerie Noe, MD regarding development and update of comprehensive plan of care as evidenced by provider attestation and co-signature ?Inter-disciplinary care team collaboration (see longitudinal plan of care) ?Comprehensive medication review performed; medication list updated in electronic ? ?Hypertension (BP goal <140/90) ?-Controlled - per clinic readings  ?-Home BP readings: none available ?-Current treatment: ?HCT 25 mg daily - Appropriate, Effective, Safe, Accessible ?Atenolol 25 mg - 1 tablet daily (primarily for tremor) - Appropriate, Effective, Safe, Accessible ?Lisinopril 10 mg daily - Appropriate, Effective, Safe, Accessible ?-Medications previously tried: propranolol - worsened COPD ?-Current exercise habits: walks her dog a lot and grandchildren keep her active ?-Denies hypotensive/hypertensive symptoms ?-Recommended to continue current medication ? ?Hyperlipidemia: (LDL goal < 70) ?-Controlled - LDL 66 (12/2020) at goal; pt endorses compliance with statin ?-Current treatment: ?Rosuvastatin 20 mg daily HS - Appropriate, Effective, Safe, Accessible ?-Medications previously tried: n/a  ?-Educated on  Cholesterol goals; Benefits of statin for ASCVD risk reduction; ?-Recommended to continue current medication ? ?Diabetes (A1c goal <7%) ?-Not ideally controlled - A1c 7.5% (07/2021) ?-Pt recently established with endocrine (Valerie Lamb); and Valerie Lamb was increased to 25 mg and Lantus switched to [name]. Pt did not make these changes and is confused about what to take now, she would like to  ?-Current home glucose readings: none available ?-Current medications: ?Jardiance 25 mg daily (AM) - Appropriate, Query Effective ?Lantus 20 units daily (AM) -Appropriate, Query Effective ?Trulicity 3 mg weekly (Wed) Appropriate, Query Effective ?Testing supplies (Accu-Chek) ?-Medications previously tried: metformin - n/v, Actos 30 mg   ?-Extensive time spent counseling/teach back method on glucose goals, (Fasting 100-140) before and 2 hours after meals (< 200) and carbohydrates impact on BG.  ?-Assessed patient finances. No cost concerns - meds are covered  ?-Discussed benefits of CGM; pt would like to use Dexcom, with her insurance she can get this at the pharmacy since she is on insulin 1 injection per day ?-Recommended to continue current medication (as above) ? ?COPD (Goal: control symptoms and prevent exacerbations) ?-Controlled - per pt no issues with breathing/SOB recently; ?-Exacerbations requiring treatment in last 6 months: 0 ?-Current treatment  ?Spiriva Respimat 2.5 mg 2 puffs daily - Appropriate, Effective, Safe, Accessible ?Albuterol HFA- Appropriate, Effective, Safe, Accessible ?-Medications previously tried: n/a  ?-Patient reports consistent use of maintenance inhaler ?-Frequency of rescue inhaler use: PRN ?-Counseled on Benefits of consistent maintenance inhaler use; When to use rescue inhaler ?-Recommended to continue current medication ? ?Tobacco use (Goal: cessation) ?-Improving - pt reports she is down to 3 cigarettes a day since starting Chantix ?-Previous quit attempts: quit during pregnancies ?-Current  treatment  ?Chantix 1 mg BID (07/2021) - Appropriate, Effective, Safe, Accessible ?-Advised to try to quit  entirely, it is best to have last cigarette during 1st month of Chantix, she is beyond that at this point ?-Recommended to continue current medication ? ?Other: ?-Ibuprofen 800 mg for leg pain ? ?Patient Goals/Self-Care Activities ?Patient will:  ?- take medications as prescribed as evidenced by patient report and record review ?focus on medication adherence by pill box ?check glucose via Dexcom, document, and provide at future appointments ?  ?  ? ?Patient verbalizes understanding of instructions and care plan provided today and agrees to view in [place]. Active MyChart status confirmed with patient.   ?Telephone follow up appointment with pharmacy team member scheduled for: 3 months ? ?[name], PharmD, BCACP ?Clinical Pharmacist ?[facility] ?[phone] ?  ?

## 2021-09-29 DIAGNOSIS — E785 Hyperlipidemia, unspecified: Secondary | ICD-10-CM | POA: Diagnosis not present

## 2021-09-29 DIAGNOSIS — J449 Chronic obstructive pulmonary disease, unspecified: Secondary | ICD-10-CM | POA: Diagnosis not present

## 2021-09-29 DIAGNOSIS — Z794 Long term (current) use of insulin: Secondary | ICD-10-CM | POA: Diagnosis not present

## 2021-09-29 DIAGNOSIS — F17210 Nicotine dependence, cigarettes, uncomplicated: Secondary | ICD-10-CM

## 2021-09-29 DIAGNOSIS — I10 Essential (primary) hypertension: Secondary | ICD-10-CM | POA: Diagnosis not present

## 2021-09-29 DIAGNOSIS — E1159 Type 2 diabetes mellitus with other circulatory complications: Secondary | ICD-10-CM

## 2021-10-17 ENCOUNTER — Encounter: Payer: Self-pay | Admitting: Family Medicine

## 2021-10-17 ENCOUNTER — Ambulatory Visit (INDEPENDENT_AMBULATORY_CARE_PROVIDER_SITE_OTHER): Payer: Medicare Other | Admitting: Family Medicine

## 2021-10-17 VITALS — BP 100/62 | HR 66 | Temp 97.4°F | Ht 67.0 in | Wt 235.1 lb

## 2021-10-17 DIAGNOSIS — Z820 Family history of epilepsy and other diseases of the nervous system: Secondary | ICD-10-CM

## 2021-10-17 DIAGNOSIS — R251 Tremor, unspecified: Secondary | ICD-10-CM

## 2021-10-17 DIAGNOSIS — G629 Polyneuropathy, unspecified: Secondary | ICD-10-CM | POA: Diagnosis not present

## 2021-10-17 DIAGNOSIS — Z720 Tobacco use: Secondary | ICD-10-CM

## 2021-10-17 DIAGNOSIS — E1122 Type 2 diabetes mellitus with diabetic chronic kidney disease: Secondary | ICD-10-CM | POA: Diagnosis not present

## 2021-10-17 MED ORDER — PREGABALIN 75 MG PO CAPS: 75.0000 mg | ORAL_CAPSULE | Freq: Two times a day (BID) | ORAL | 0 refills | Status: DC

## 2021-10-17 NOTE — Assessment & Plan Note (Signed)
Persistent symptoms. Increase lyrica from 50 mg BID to 75 mg BID. Discussed we could increase further if needed.

## 2021-10-17 NOTE — Progress Notes (Signed)
Subjective:     Valerie Lamb is a 66 y.o. female presenting for Follow-up (3 mo)     HPI  #Diabetes Currently taking trulicity and jardiance but has been forgetting insulin  Using medications without difficulties: Yes Hypoglycemic episodes:No  Hyperglycemic episodes:No  Feet problems:No  Blood Sugars averaging: 75-130 Last HgbA1c:  Lab Results  Component Value Date   HGBA1C 7.5 (A) 07/22/2021   Eating more veggies and fruit  Diabetes Health Maintenance Due:    Diabetes Health Maintenance Due  Topic Date Due   FOOT EXAM  10/24/2021   HEMOGLOBIN A1C  01/19/2022   OPHTHALMOLOGY EXAM  07/04/2022   Joined the [employer]  Tobacco - doing well over all  - found her son's stash and smoked - using chantix - smoking every few days and getting sick when she does  #neuropathy - lyrica is not helping  - still having pain  - getting worse and waking her up from sleep - went from 10 to 6 on the pain scale   Review of Systems   Social History   Tobacco Use  Smoking Status Every Day   Packs/day: 0.25   Years: 46.00   Pack years: 11.50   Types: Cigarettes   Start date: 1973  Smokeless Tobacco Never  Tobacco Comments   2 ppd previously, cut back 2021        Objective:    BP Readings from Last 3 Encounters:  10/17/21 100/62  09/10/21 124/80  07/22/21 110/62   Wt Readings from Last 3 Encounters:  10/17/21 235 lb 1 oz (106.6 kg)  09/10/21 235 lb (106.6 kg)  07/22/21 231 lb 2 oz (104.8 kg)    BP 100/62   Pulse 66   Temp (!) 97.4 F (36.3 C) (Temporal)   Ht '5\' 7"'$  (1.702 m)   Wt 235 lb 1 oz (106.6 kg)   SpO2 97%   BMI 36.82 kg/m    Physical Exam Constitutional:      General: She is not in acute distress.    Appearance: She is well-developed. She is not diaphoretic.  HENT:     Right Ear: External ear normal.     Left Ear: External ear normal.     Nose: Nose normal.  Eyes:     Conjunctiva/sclera: Conjunctivae normal.  Cardiovascular:     Rate  and Rhythm: Normal rate and regular rhythm.  Pulmonary:     Effort: Pulmonary effort is normal.     Breath sounds: Normal breath sounds.  Musculoskeletal:     Cervical back: Neck supple.  Skin:    General: Skin is warm and dry.     Capillary Refill: Capillary refill takes less than 2 seconds.  Neurological:     Mental Status: She is alert. Mental status is at baseline.     Comments: Slight tremor on left index finger  Psychiatric:        Mood and Affect: Mood normal.        Behavior: Behavior normal.          Assessment & Plan:   Problem List Items Addressed This Visit       Endocrine   Type 2 diabetes mellitus with diabetic chronic kidney disease ([place])    Too early for hemoglobin A1c today.  Though DEXAcom with ranges 75-130 and though she has forgotten to take her insulin.  Discussed continuing Trulicity 3 mg weekly and Jardiance 25 mg daily.  Okay to hold insulin, continue monitoring CBGs and restart  if elevated.  Recheck hemoglobin A1c in a couple of weeks.         Nervous and Auditory   Neuropathy    Persistent symptoms. Increase lyrica from 50 mg BID to 75 mg BID. Discussed we could increase further if needed.        Relevant Medications   pregabalin (LYRICA) 75 MG capsule   Other Relevant Orders   Ambulatory referral to Neurology     Other   Tobacco abuse    Continues to do well on chantix with reduction. Encouraged continued cessation       Tremor - Primary    Still with mild tremor - localized to the left hand and one finger. She is wondering about MS. Discussed given neuropathy and tremor will get opinion of neurologist.        Relevant Orders   Ambulatory referral to Neurology   Other Visit Diagnoses     Family history of MS (multiple sclerosis)       Relevant Orders   Ambulatory referral to Neurology        Return in about 4 months (around 02/17/2022) for diabetes.  Lesleigh Noe, MD

## 2021-10-17 NOTE — Assessment & Plan Note (Signed)
Continues to do well on chantix with reduction. Encouraged continued cessation

## 2021-10-17 NOTE — Assessment & Plan Note (Signed)
Still with mild tremor - localized to the left hand and one finger. She is wondering about MS. Discussed given neuropathy and tremor will get opinion of neurologist.

## 2021-10-17 NOTE — Assessment & Plan Note (Signed)
Too early for hemoglobin A1c today.  Though DEXAcom with ranges 75-130 and though she has forgotten to take her insulin.  Discussed continuing Trulicity 3 mg weekly and Jardiance 25 mg daily.  Okay to hold insulin, continue monitoring CBGs and restart if elevated.  Recheck hemoglobin A1c in a couple of weeks.

## 2021-10-17 NOTE — Patient Instructions (Addendum)
Increase lyrica to 75 mg twice daily  If tolerating and no improvement update and can consider slowly increasing to 150 mg twice daily  Schedule lab appointment in 4-6 weeks  Monitor off lantus  If CBG>140 consistently, restart lantus

## 2021-10-21 ENCOUNTER — Telehealth: Payer: Self-pay

## 2021-10-21 DIAGNOSIS — E1122 Type 2 diabetes mellitus with diabetic chronic kidney disease: Secondary | ICD-10-CM

## 2021-10-21 MED ORDER — A1C TEST AT-HOME VI KIT: PACK | 0 refills | Status: DC

## 2021-10-21 NOTE — Telephone Encounter (Signed)
Unable to print prescription will work with IT to get this sorted

## 2021-10-21 NOTE — Addendum Note (Signed)
Addended by: Lesleigh Noe on: 10/21/2021 05:16 PM   Modules accepted: Orders

## 2021-10-21 NOTE — Addendum Note (Signed)
Addended by: Charlton Haws on: 10/21/2021 01:50 PM   Modules accepted: Orders

## 2021-10-21 NOTE — Addendum Note (Signed)
Addended by: Loreen Freud on: 10/21/2021 02:28 PM   Modules accepted: Orders

## 2021-10-21 NOTE — Telephone Encounter (Signed)
Sending to [place] as I do not have printer on the phones I am working at.

## 2021-10-21 NOTE — Telephone Encounter (Signed)
Patient states she was just seen by Dr Einar Pheasant recently and was too early to check A1C, patient was advised to check with her insurance to see if they would cover A1C kit to have at home and she states she checked with insurance and it would be covered if Dr Einar Pheasant submits the information. [facility] fax number [phone] to send over the order and pre authorization.

## 2021-10-21 NOTE — Telephone Encounter (Signed)
Routing to pharmacy to see if she can help with ordering to [facility]

## 2021-10-22 MED ORDER — A1C TEST AT-HOME VI KIT: PACK | 0 refills | Status: DC

## 2021-10-22 NOTE — Telephone Encounter (Signed)
Attempt to order to mail order pharmacy directly.

## 2021-10-22 NOTE — Addendum Note (Signed)
Addended by: Charlton Haws on: 10/22/2021 03:34 PM   Modules accepted: Orders

## 2021-10-23 MED ORDER — A1C TEST AT-HOME VI KIT: PACK | 0 refills | Status: DC

## 2021-10-23 NOTE — Addendum Note (Signed)
Addended by: Charlton Haws on: 10/23/2021 01:23 PM   Modules accepted: Orders

## 2021-10-23 NOTE — Addendum Note (Signed)
Addended by: Charlton Haws on: 10/23/2021 11:59 AM   Modules accepted: Orders

## 2021-10-23 NOTE — Telephone Encounter (Signed)
Contacted [facility] and they state they received the RX.  [facility] has  reached out to the patient  to let her know that she can get this from her local pharmacy due to her having medicare. They wont ship it. I asked about the prescription and [facility] states the pharmacy can call and request a transfer Rx from them.  [name], CPP notified  Avel Sensor, [place]  [phone]

## 2021-10-24 MED ORDER — A1C TEST AT-HOME VI KIT: PACK | 0 refills | Status: DC

## 2021-10-24 NOTE — Addendum Note (Signed)
Addended by: Charlton Haws on: 10/24/2021 01:08 PM   Modules accepted: Orders

## 2021-10-24 NOTE — Telephone Encounter (Signed)
[facility] mail and [facility] were unable to bill for A1c kit. Sent order to [facility] as they can bill Part B. 

## 2021-10-29 ENCOUNTER — Telehealth: Payer: Self-pay

## 2021-10-29 NOTE — Progress Notes (Signed)
Contacted by the patient to say she was on her last 24 hours with the  sensor for her Dexcom G7, refill request sent. The patient will get delivery tomorrow.  [name], CPP notified  Avel Sensor, [place]  [phone]

## 2021-10-30 ENCOUNTER — Encounter: Payer: Self-pay | Admitting: Family Medicine

## 2021-11-01 ENCOUNTER — Telehealth: Payer: Self-pay

## 2021-11-01 NOTE — Telephone Encounter (Signed)
Patient is calling in stating that she came in on the 18th, realized it was too early for a recheck of an A1C. Valerie Lamb is wanting to know if when she comes in for the A1C check if there is any othe rblood work she needs ot have done as well.

## 2021-11-01 NOTE — Telephone Encounter (Signed)
Mychart read by Antony Odea at 12:32 PM on 11/01/2021.

## 2021-11-01 NOTE — Telephone Encounter (Signed)
Answered pt's question

## 2021-11-01 NOTE — Telephone Encounter (Signed)
Mychart sent to pt.

## 2021-11-05 ENCOUNTER — Other Ambulatory Visit: Payer: Self-pay | Admitting: Family Medicine

## 2021-11-06 IMAGING — US US PELVIS COMPLETE WITH TRANSVAGINAL
1 series · 13 of 25 positions shown · non-contrast
Comparison: None

CLINICAL DATA: Endometrial hyperplasia with atypia, postmenopausal
bleeding, bleeding since [REDACTED] per patient



[Series 1: us pelvis complete with transvaginal · 13 of 77 slices shown]
[im 1/77]
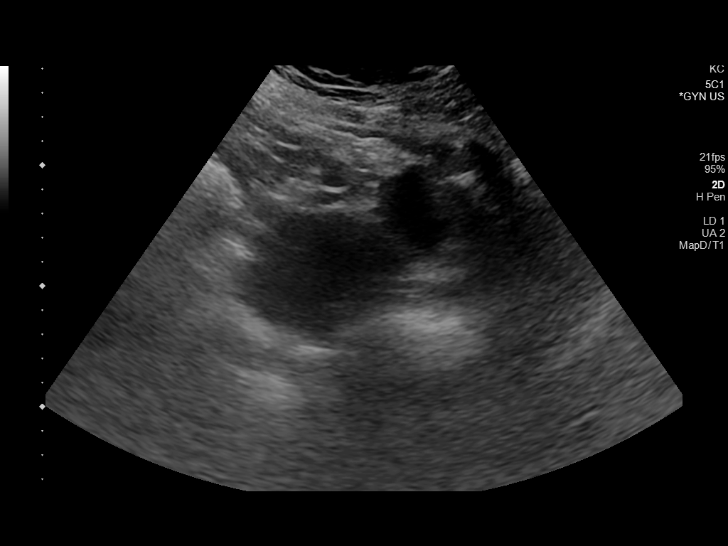
[im 7/77]
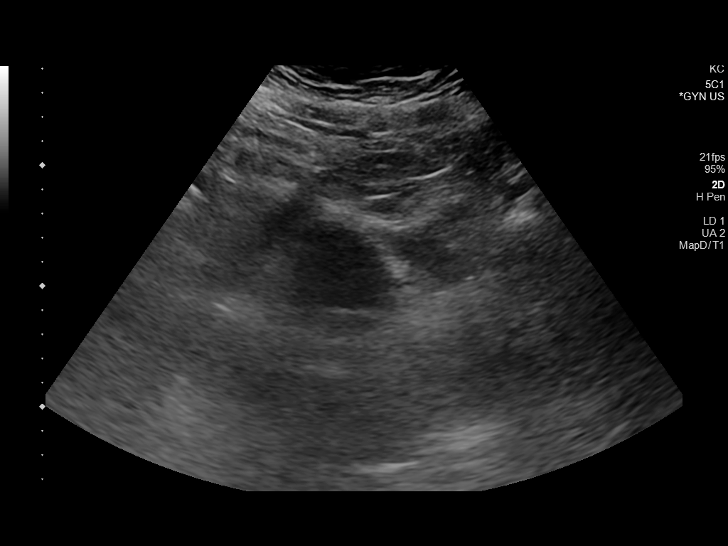
[im 13/77]
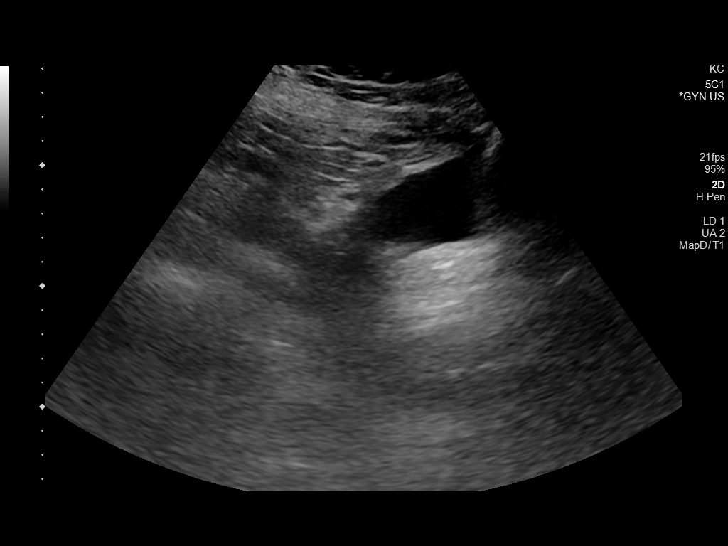
[im 20/77]
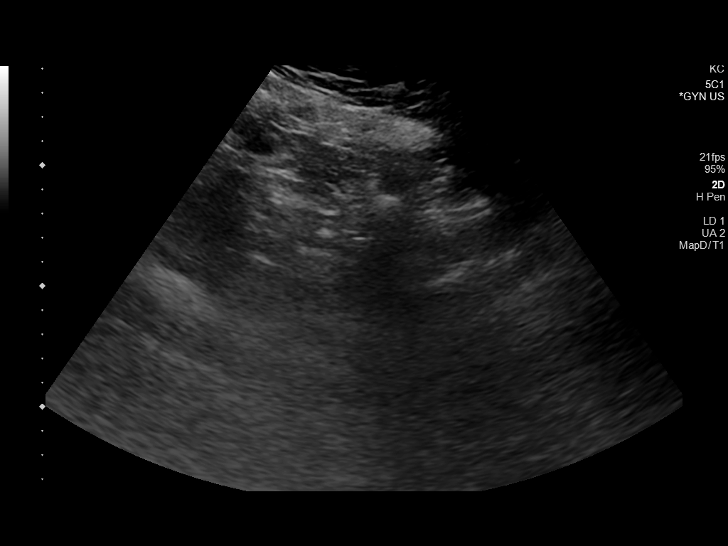
[im 26/77]
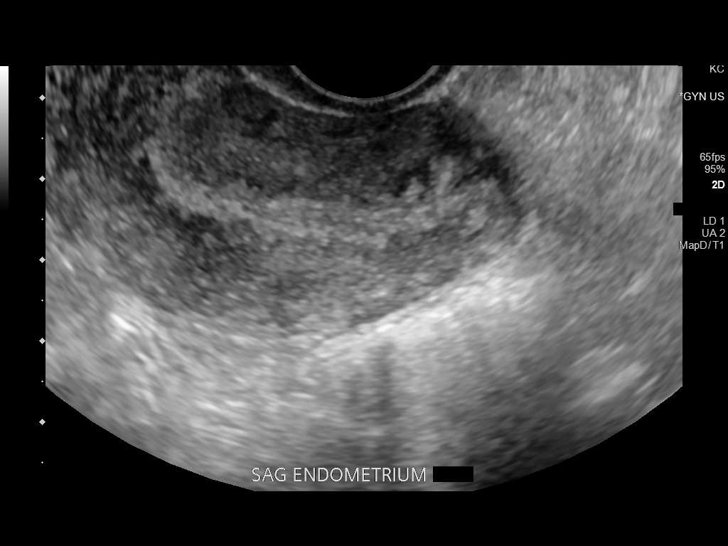
[im 32/77]
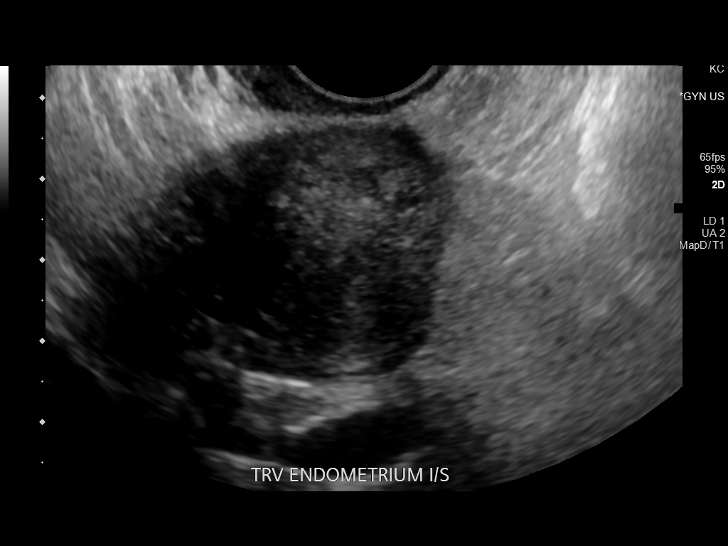
[im 39/77]
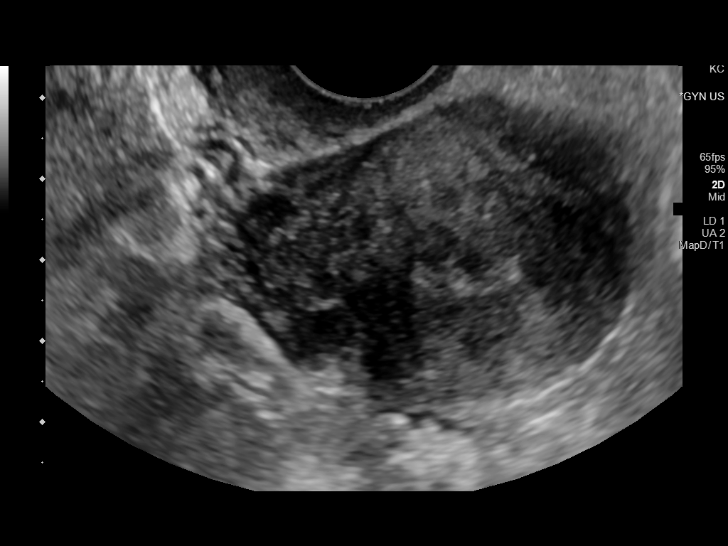
[im 45/77]
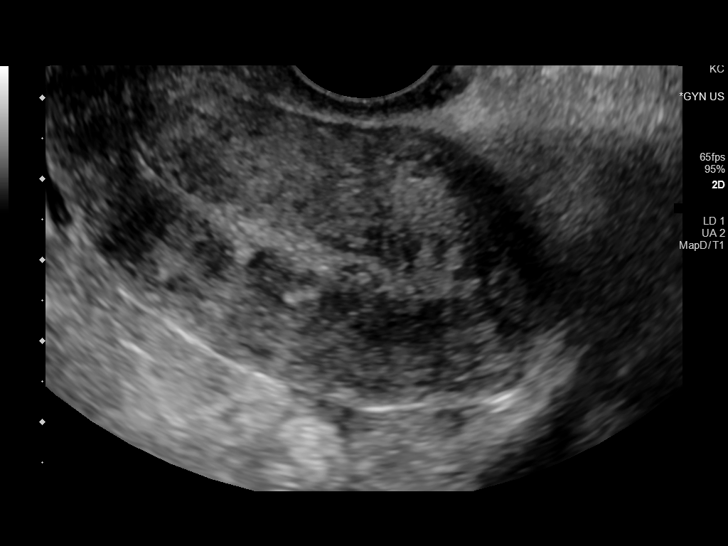
[im 51/77]
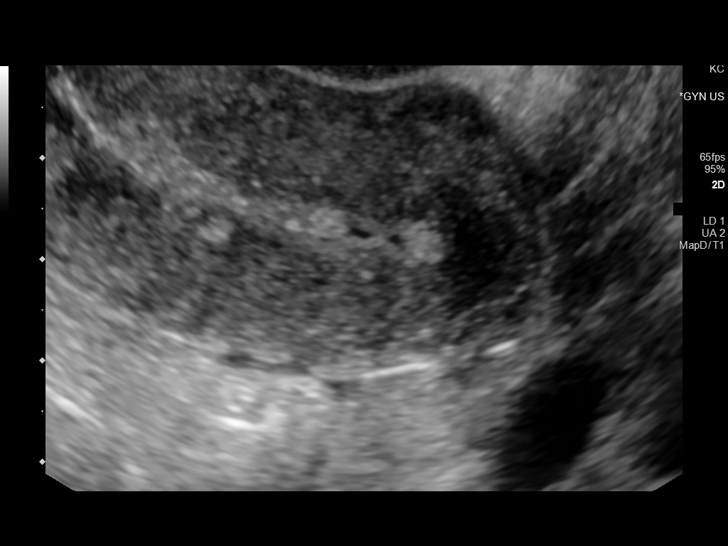
[im 58/77]
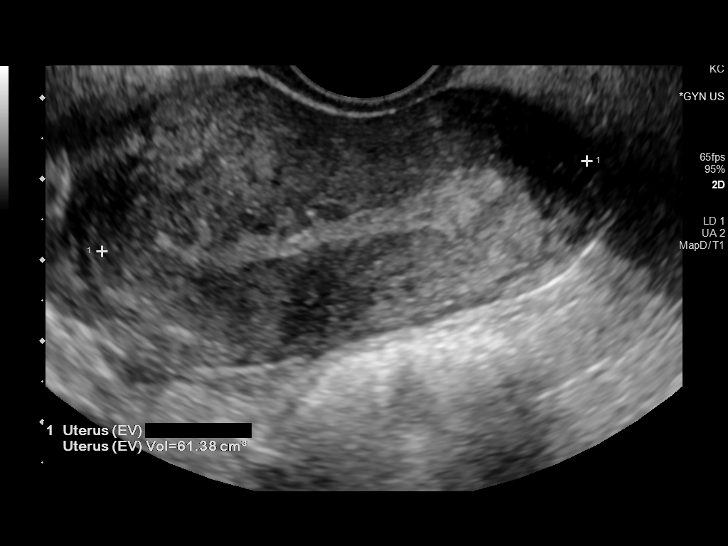
[im 64/77]
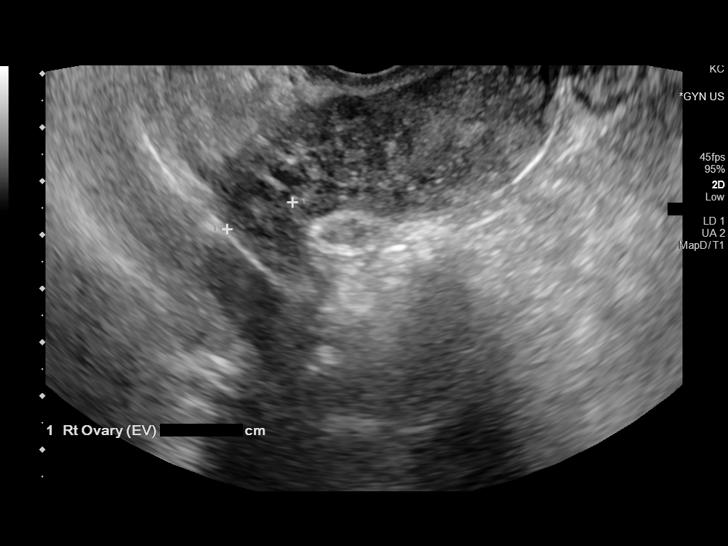
[im 70/77]
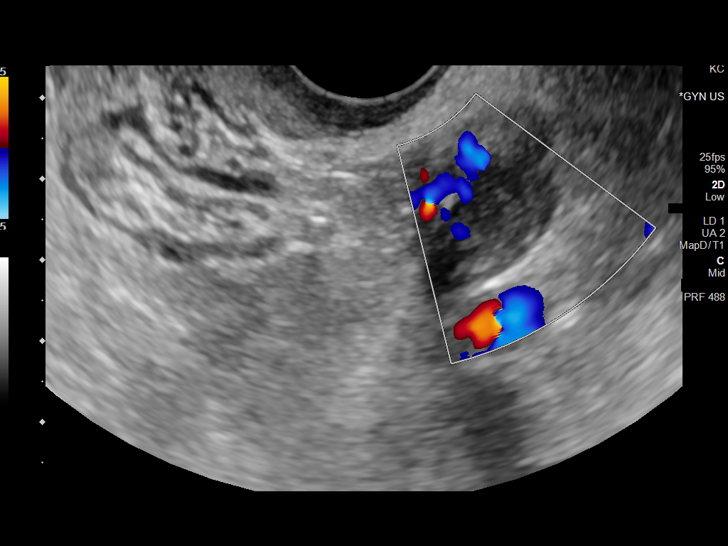
[im 77/77]
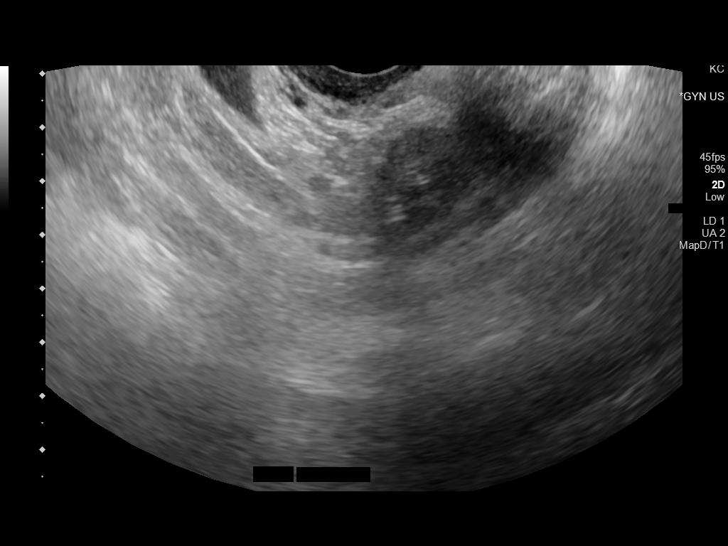

[13 of 25 positions shown; findings below may reference images not displayed]

FINDINGS: Uterus

Measurements: 6.6 x 2.9 x 6.1 cm = volume: 61 mL. Retroverted.
Normal morphology without mass

Endometrium

Thickness: 9 mm.  Question 5 mm polyp.  Trace endometrial fluid.

Right ovary

Measurements: 2.2 x 1.2 x 1.3 cm = volume: 1.8 mL. Normal morphology
without mass.

Left ovary

Measurements: 2.0 x 1.2 x 1.3 cm = volume: 1.5 mL. Normal morphology
without mass

Other findings

No free pelvic fluid.  No adnexal masses.
IMPRESSION: Abnormal thickened endometrial complex with questionable 5 mm polyp.

In the setting of post-menopausal bleeding, endometrial sampling is
indicated to exclude carcinoma. If results are benign,
sonohysterogram should be considered for focal lesion work-up prior
to hysteroscopy. (Ref: Radiological Reasoning: Algorithmic Workup of
Abnormal Vaginal Bleeding with Endovaginal Sonography and
Sonohysterography. AJR 0115; 191:S68-73)

These results will be called to the ordering clinician or
representative by the Radiologist Assistant, and communication
documented in the PACS or [REDACTED].

## 2021-11-14 ENCOUNTER — Other Ambulatory Visit (INDEPENDENT_AMBULATORY_CARE_PROVIDER_SITE_OTHER): Payer: Medicare Other | Admitting: Family Medicine

## 2021-11-14 ENCOUNTER — Other Ambulatory Visit: Payer: Medicare Other

## 2021-11-14 DIAGNOSIS — E1122 Type 2 diabetes mellitus with diabetic chronic kidney disease: Secondary | ICD-10-CM | POA: Diagnosis not present

## 2021-11-14 LAB — POCT GLYCOSYLATED HEMOGLOBIN (HGB A1C): Hemoglobin A1C: 7.1 % — AB (ref 4.0–5.6)

## 2021-11-15 ENCOUNTER — Other Ambulatory Visit: Payer: Self-pay | Admitting: Pharmacist

## 2021-11-15 DIAGNOSIS — E1169 Type 2 diabetes mellitus with other specified complication: Secondary | ICD-10-CM

## 2021-11-15 DIAGNOSIS — G629 Polyneuropathy, unspecified: Secondary | ICD-10-CM

## 2021-11-15 NOTE — Telephone Encounter (Signed)
Patient requests refill for rosuvastatin 20 mg and pregabalin 75 mg BID.  She had rosuvastatin refilled once by an outside physician while [facility] was closed for renovations, but requests PCP resume prescribing this.

## 2021-11-18 ENCOUNTER — Other Ambulatory Visit: Payer: Self-pay

## 2021-11-18 DIAGNOSIS — G629 Polyneuropathy, unspecified: Secondary | ICD-10-CM

## 2021-11-18 MED ORDER — ROSUVASTATIN CALCIUM 20 MG PO TABS: 20.0000 mg | ORAL_TABLET | Freq: Every day | ORAL | 0 refills | Status: DC

## 2021-11-18 MED ORDER — PREGABALIN 75 MG PO CAPS: 75.0000 mg | ORAL_CAPSULE | Freq: Two times a day (BID) | ORAL | 0 refills | Status: DC

## 2021-11-18 NOTE — Telephone Encounter (Signed)
Last refill: 10/17/21 #60 with 0 Last OV: 10/17/21 No future appts scheduled

## 2021-11-28 ENCOUNTER — Telehealth: Payer: Self-pay

## 2021-11-28 NOTE — Chronic Care Management (AMB) (Signed)
Chronic Care Management Pharmacy Assistant   Name: Valerie Lamb  MRN: [id number] DOB: 04/09/1956  Reason for Encounter: Medication Adherence and Delivery Coordination    Recent office visits:  10/17/21-Jessica Cody,MD(PCP)-f/u tremors-Discussed continuing Trulicity 3 mg weekly and Jardiance 25 mg daily.  Okay to hold insulin, continue monitoring CBGs and restart if elevated.Increase lyrica from 50 mg BID to 75 mg BID-referral for neurology-f/u 4 months   Recent consult visits:  None since last CCM contact   Hospital visits:  None in previous 6 months  Medications: Outpatient Encounter Medications as of 11/28/2021  Medication Sig   Accu-Chek FastClix Lancets MISC 1 Device by Does not apply route in the morning and at bedtime. Use as directed to check blood glucose twice daily   albuterol (VENTOLIN HFA) 108 (90 Base) MCG/ACT inhaler Inhale 1-2 puffs into the lungs every 6 (six) hours as needed for shortness of breath or wheezing.   atenolol (TENORMIN) 25 MG tablet Take 1 tablet (25 mg total) by mouth daily.   Continuous Blood Gluc Receiver ([place]) [place] 1 each by Does not apply route daily.   Continuous Blood Gluc Sensor (DEXCOM G7 SENSOR) MISC Apply sensor every 10 days as directed   Dulaglutide (TRULICITY) 3 GB/2.0FE SOPN Inject 3 mg as directed once a week.   empagliflozin (JARDIANCE) 25 MG TABS tablet Take 1 tablet (25 mg total) by mouth daily before breakfast.   Hemoglobin A1c, HbA1c, Test (A1C TEST AT-HOME) KIT Use to test A1c at home   ibuprofen (ADVIL) 800 MG tablet Take 1 tablet (800 mg total) by mouth every 8 (eight) hours.   insulin glargine (LANTUS SOLOSTAR) 100 UNIT/ML Solostar Pen Inject 20 Units into the skin daily.   Insulin Pen Needle 31G X 5 MM MISC Inject 1 Device into the skin daily in the afternoon. Use once daily to inject 10 units   levothyroxine (SYNTHROID) 100 MCG tablet Take 1 tablet (100 mcg total) by mouth daily before breakfast.   lisinopril  (ZESTRIL) 10 MG tablet Take 1 tablet (10 mg total) by mouth daily.   Melatonin 10 MG TABS Take 10 mg by mouth at bedtime.   omeprazole (PRILOSEC) 40 MG capsule Take 1 capsule (40 mg total) by mouth 2 (two) times daily.   pregabalin (LYRICA) 75 MG capsule Take 1 capsule (75 mg total) by mouth 2 (two) times daily.   rosuvastatin (CRESTOR) 20 MG tablet Take 1 tablet (20 mg total) by mouth at bedtime.   Tiotropium Bromide Monohydrate 2.5 MCG/ACT AERS Inhale 2 puffs into the lungs daily.   varenicline (CHANTIX CONTINUING MONTH PAK) 1 MG tablet Take 1 tablet (1 mg total) by mouth 2 (two) times daily.   No facility-administered encounter medications on file as of 11/28/2021.   BP Readings from Last 3 Encounters:  10/17/21 100/62  09/10/21 124/80  07/22/21 110/62    Lab Results  Component Value Date   HGBA1C 7.1 (A) 11/14/2021      Recent OV, Consult or Hospital visit:  Recent medication changes indicated:  Increase  pregabalin to 79m twice daily    Last adherence delivery date:09/23/21      Patient is due for next adherence delivery on: 12/09/21  Spoke with patient on 11/28/21 reviewed medications and coordinated delivery.  This delivery to include: Adherence Packaging  30 Days  Packs: Lisinopril 161m take 1 tablet at breakfast Omeprazole 4042mtake 1 tablet breakfast, take 1 tablet bedtime  Rosuvastatin 68m29make 1 tablet bedtime Pregabalin 75mg68me  1 tablet breakfast,take 1 tablet bedtime  Omeprazole 75m- take 1 tablet breakfast,1 tablet bedtime  Jardiance 2104m-take 1 tablet at breakfast   VIAL medications: Spiriva 2.5 mg - 2 puffs once daily  Trulicity 51m40minject once weekly  Dexcom sensors  Patient declined the following medications this month: Lantus 100 units- Dr. CodEinar Pheasantopped late May 2023 Atenolol 65m751make 1 tablet reakfast- patient has 100 pills left  Levothyroxine 100mc31mke 1 tablet before breakfast- patient has 60 oills left Varenicline 10mg-451me 1 tablet  breakfast, take 1 tablet bedtime- prn use    Any concerns about your medications? No  How often do you forget or accidentally miss a dose? Rarely  Do you use a pillbox? No  Is patient in packaging Yes - New onboard  Refills requested from providers include:  atenolol,jardiance  Confirmed delivery date of 12/09/21, advised patient that pharmacy will contact them the morning of delivery.  Recent blood pressure readings are as follows:none available    Recent blood glucose readings are as follows: Fasting:  ranging  92 - 114   Annual wellness visit in last year? No Most Recent BP reading:100/62  66-P  If Diabetic: Most recent A1C reading: 7.1  11/14/21 Last eye exam / retinopathy screening: UTD per chart Last diabetic foot exam: UTD per chart  [facility] LindseCharlene Brookenotified  VelmenAvel Sensor [phone]

## 2021-11-29 NOTE — Telephone Encounter (Signed)
Patient was meant to start pill packs with all meds however now she says she has 60-day supply of levothyroxine and atenolol because it was filled at another pharmacy. She still wants to start packs without all meds included. Will coordinate with [facility] per patient request.

## 2021-12-02 ENCOUNTER — Other Ambulatory Visit: Payer: Self-pay

## 2021-12-02 DIAGNOSIS — R251 Tremor, unspecified: Secondary | ICD-10-CM

## 2021-12-02 MED ORDER — ATENOLOL 25 MG PO TABS: 25.0000 mg | ORAL_TABLET | Freq: Every day | ORAL | 0 refills | Status: DC

## 2021-12-11 ENCOUNTER — Telehealth: Payer: Self-pay | Admitting: Family Medicine

## 2021-12-11 NOTE — Telephone Encounter (Signed)
Left message for patient to call back and schedule Medicare Annual Wellness Visit (AWV) either virtually or phone   awvi 04/02/17 per [place]    This should be a 45 minute visit.  I left my direct # [phone]

## 2021-12-13 ENCOUNTER — Ambulatory Visit (INDEPENDENT_AMBULATORY_CARE_PROVIDER_SITE_OTHER): Payer: Medicare Other

## 2021-12-13 VITALS — Ht 67.0 in | Wt 205.0 lb

## 2021-12-13 DIAGNOSIS — Z0000 Encounter for general adult medical examination without abnormal findings: Secondary | ICD-10-CM

## 2021-12-13 NOTE — Progress Notes (Signed)
I connected with Valerie Lamb today by telephone and verified that I am speaking with the correct person using two identifiers. Location patient: home Location provider: work Persons participating in the virtual visit: Khadijatou Borak, Glenna Durand LPN.   I discussed the limitations, risks, security and privacy concerns of performing an evaluation and management service by telephone and the availability of in person appointments. I also discussed with the patient that there may be a patient responsible charge related to this service. The patient expressed understanding and verbally consented to this telephonic visit.    Interactive audio and video telecommunications were attempted between this provider and patient, however failed, due to patient having technical difficulties OR patient did not have access to video capability.  We continued and completed visit with audio only.     Vital signs may be patient reported or missing.  Subjective:   Valerie Lamb is a 66 y.o. female who presents for an Initial Medicare Annual Wellness Visit.  Review of Systems     Cardiac Risk Factors include: diabetes mellitus;dyslipidemia;hypertension;obesity (BMI >30kg/m2);smoking/ tobacco exposure     Objective:    Today's Vitals   12/13/21 1326 12/13/21 1327  Weight: 205 lb (93 kg)   Height: $Remove'5\' 7"'gyZdohc$  (1.702 m)   PainSc:  5    Body mass index is 32.11 kg/m.     12/13/2021    1:39 PM 02/27/2021    3:20 AM 02/26/2021    8:08 AM 02/22/2021   10:20 AM 08/23/2020    8:51 AM 08/17/2020    5:14 PM  Advanced Directives  Does Patient Have a Medical Advance Directive? No  No No No No  Would patient like information on creating a medical advance directive?  No - Patient declined  No - Patient declined No - Patient declined No - Patient declined    Current Medications (verified) Outpatient Encounter Medications as of 12/13/2021  Medication Sig   Accu-Chek FastClix Lancets MISC 1 Device by Does not apply route  in the morning and at bedtime. Use as directed to check blood glucose twice daily   albuterol (VENTOLIN HFA) 108 (90 Base) MCG/ACT inhaler Inhale 1-2 puffs into the lungs every 6 (six) hours as needed for shortness of breath or wheezing.   atenolol (TENORMIN) 25 MG tablet Take 1 tablet (25 mg total) by mouth daily.   Continuous Blood Gluc Receiver ([place]) [place] 1 each by Does not apply route daily.   Continuous Blood Gluc Sensor (DEXCOM G7 SENSOR) MISC Apply sensor every 10 days as directed   Dulaglutide (TRULICITY) 3 GB/2.0FE SOPN Inject 3 mg as directed once a week.   empagliflozin (JARDIANCE) 25 MG TABS tablet Take 1 tablet (25 mg total) by mouth daily before breakfast.   ibuprofen (ADVIL) 800 MG tablet Take 1 tablet (800 mg total) by mouth every 8 (eight) hours.   levothyroxine (SYNTHROID) 100 MCG tablet Take 1 tablet (100 mcg total) by mouth daily before breakfast.   lisinopril (ZESTRIL) 10 MG tablet Take 1 tablet (10 mg total) by mouth daily.   Melatonin 10 MG TABS Take 10 mg by mouth at bedtime.   omeprazole (PRILOSEC) 40 MG capsule Take 1 capsule (40 mg total) by mouth 2 (two) times daily.   pregabalin (LYRICA) 75 MG capsule Take 1 capsule (75 mg total) by mouth 2 (two) times daily.   rosuvastatin (CRESTOR) 20 MG tablet Take 1 tablet (20 mg total) by mouth at bedtime.   Tiotropium Bromide Monohydrate 2.5 MCG/ACT AERS Inhale  2 puffs into the lungs daily.   varenicline (CHANTIX CONTINUING MONTH PAK) 1 MG tablet Take 1 tablet (1 mg total) by mouth 2 (two) times daily.   Hemoglobin A1c, HbA1c, Test (A1C TEST AT-HOME) KIT Use to test A1c at home   insulin glargine (LANTUS SOLOSTAR) 100 UNIT/ML Solostar Pen Inject 20 Units into the skin daily.   Insulin Pen Needle 31G X 5 MM MISC Inject 1 Device into the skin daily in the afternoon. Use once daily to inject 10 units   No facility-administered encounter medications on file as of 12/13/2021.    Allergies (verified) Metformin    History: Past Medical History:  Diagnosis Date   Anemia    Anxiety    Asthma    COPD (chronic obstructive pulmonary disease) (HCC)    Depression    Diabetes mellitus without complication ([place])    Endometriosis    Gastric ulcer    GERD (gastroesophageal reflux disease)    Hypertension    Hypothyroidism    Sleep apnea    Thyroid disease    Past Surgical History:  Procedure Laterality Date   HYSTEROSCOPY WITH D & C N/A 08/23/2020   Procedure: DILATATION AND CURETTAGE /HYSTEROSCOPY, polypectomy;  Surgeon: Homero Fellers, MD;  Location: [facility];  Service: Gynecology;  Laterality: N/A;   TUBAL LIGATION     VAGINAL HYSTERECTOMY Bilateral 02/26/2021   Procedure: HYSTERECTOMY VAGINAL;  Surgeon: Chancy Milroy, MD;  Location: [facility];  Service: Gynecology;  Laterality: Bilateral;   Family History  Problem Relation Age of Onset   Alcohol abuse Mother    Arthritis Mother    Asthma Mother    COPD Mother    Depression Mother    Drug abuse Mother    Early death Mother    Hyperlipidemia Mother    Hypertension Mother    Alcohol abuse Father    Cancer Father    Multiple sclerosis Maternal Aunt    Social History   Socioeconomic History   Marital status: Widowed    Spouse name: Not on file   Number of children: Not on file   Years of education: Not on file   Highest education level: Not on file  Occupational History   Not on file  Tobacco Use   Smoking status: Every Day    Packs/day: 0.25    Years: 46.00    Total pack years: 11.50    Types: Cigarettes    Start date: 1973   Smokeless tobacco: Never   Tobacco comments:    2 ppd previously, cut back 2021  Vaping Use   Vaping Use: Never used  Substance and Sexual Activity   Alcohol use: Never   Drug use: Never   Sexual activity: Not Currently  Other Topics Concern   Not on file  Social History Narrative   05/07/20   From: [place], [place]. Moved in 2017    Living: with husband, Valerie Lamb (2005)   Work: disability  due to back pain, [employer] prior to this      Family: son - Valerie Lamb (in [place]) , Valerie Lamb ([place]), Valerie Lamb (SIDS death), Valerie Lamb ([place]) - 3 grandchildren (one passed away)      Enjoys: short attention span, computer, knitting      Exercise: walking the dog   Diet: not currently, does not follow diabetic diet      Safety   Seat belts: Yes    Guns: No   Safe in relationships: Yes    Social Determinants of Health  Financial Resource Strain: Low Risk  (12/13/2021)   Overall Financial Resource Strain (CARDIA)    Difficulty of Paying Living Expenses: Not hard at all  Food Insecurity: No Food Insecurity (12/13/2021)   Hunger Vital Sign    Worried About Running Out of Food in the Last Year: Never true    Ran Out of Food in the Last Year: Never true  Transportation Needs: No Transportation Needs (12/13/2021)   PRAPARE - Hydrologist (Medical): No    Lack of Transportation (Non-Medical): No  Physical Activity: Insufficiently Active (12/13/2021)   Exercise Vital Sign    Days of Exercise per Week: 2 days    Minutes of Exercise per Session: 60 min  Stress: No Stress Concern Present (12/13/2021)   [place]    Feeling of Stress : Not at all  Social Connections: Not on file    Tobacco Counseling Ready to quit: Yes Counseling given: Not Answered Tobacco comments: 2 ppd previously, cut back 2021   Clinical Intake:  Pre-visit preparation completed: Yes  Pain : 0-10 Pain Score: 5  Pain Type: Chronic pain Pain Location: Arm Pain Orientation: Left, Right Pain Descriptors / Indicators: Shooting Pain Onset: More than a month ago Pain Frequency: Intermittent     Nutritional Status: BMI > 30  Obese Nutritional Risks: None Diabetes: Yes  How often do you need to have someone help you when you read instructions, pamphlets, or other written materials from your doctor or pharmacy?: 1 - Never What is the last  grade level you completed in school?: GED  Diabetic? Yes Nutrition Risk Assessment:  Has the patient had any N/V/D within the last 2 months?  No  Does the patient have any non-healing wounds?  No  Has the patient had any unintentional weight loss or weight gain?  No   Diabetes:  Is the patient diabetic?  Yes  If diabetic, was a CBG obtained today?  No  Did the patient bring in their glucometer from home?  No  How often do you monitor your CBG's? frequently.   Financial Strains and Diabetes Management:  Are you having any financial strains with the device, your supplies or your medication? No .  Does the patient want to be seen by Chronic Care Management for management of their diabetes?  No  Would the patient like to be referred to a Nutritionist or for Diabetic Management?  No   Diabetic Exams:  Diabetic Eye Exam: Completed 07/04/2021 Diabetic Foot Exam: Overdue, Pt has been advised about the importance in completing this exam. Pt is scheduled for diabetic foot exam on next appointment.   Interpreter Needed?: No  Information entered by :: NAllen LPN   Activities of Daily Living    12/13/2021    1:41 PM 02/27/2021    3:12 AM  In your present state of health, do you have any difficulty performing the following activities:  Hearing? 0   Vision? 0   Difficulty concentrating or making decisions? 0   Walking or climbing stairs? 0   Dressing or bathing? 0   Doing errands, shopping? 0 0  Preparing Food and eating ? N   Using the Toilet? N   In the past six months, have you accidently leaked urine? N   Do you have problems with loss of bowel control? N   Managing your Medications? N   Managing your Finances? N   Housekeeping or managing your Housekeeping?  N     Patient Care Team: Lesleigh Noe, MD as PCP - General (Family Medicine) Charlton Haws, [facility] as Pharmacist (Pharmacist)  Indicate any recent Medical Services you may have received from other than [facility]  providers in the past year (date may be approximate).     Assessment:   This is a routine wellness examination for Valerie Lamb.  Hearing/Vision screen Vision Screening - Comments:: No regular eye exams, [facility]  Dietary issues and exercise activities discussed: Current Exercise Habits: Home exercise routine, Time (Minutes): 60, Frequency (Times/Week): 2, Weekly Exercise (Minutes/Week): 120   Goals Addressed             This Visit's Progress    Patient Stated       12/13/2021, quit smoking and losing weight       Depression Screen    12/13/2021    1:41 PM 07/22/2021   10:13 AM 01/15/2021    8:17 AM 10/09/2020   11:18 AM 05/07/2020    9:16 AM  PHQ 2/9 Scores  PHQ - 2 Score 0 _0 PHQ- 9 Score  _1 Fall Risk    12/13/2021    1:41 PM  Fall Risk   Falls in the past year? 0  Number falls in past yr: 0  Injury with Fall? 0  Risk for fall due to : Medication side effect  Follow up Falls evaluation completed;Education provided;Falls prevention discussed    FALL RISK PREVENTION PERTAINING TO THE HOME:  Any stairs in or around the home? Yes  If so, are there any without handrails? No  Home free of loose throw rugs in walkways, pet beds, electrical cords, etc? Yes  Adequate lighting in your home to reduce risk of falls? Yes   ASSISTIVE DEVICES UTILIZED TO PREVENT FALLS:  Life alert? No  Use of a cane, walker or w/c? No  Grab bars in the bathroom? No  Shower chair or bench in shower? No  Elevated toilet seat or a handicapped toilet? No   TIMED UP AND GO:  Was the test performed? No .       Cognitive Function:        12/13/2021    1:43 PM  6CIT Screen  What Year? 0 points  What month? 0 points  What time? 0 points  Count back from 20 0 points  Months in reverse 0 points  Repeat phrase 2 points  Total Score 2 points    Immunizations Immunization History  Administered Date(s) Administered   Influenza,inj,Quad PF,6+ Mos  02/22/2020, 03/25/2021   Janssen (J&J) SARS-COV-2 Vaccination 10/01/2019   PFIZER Comirnaty(Gray Top)Covid-19 Tri-Sucrose Vaccine 05/17/2020   PFIZER(Purple Top)SARS-COV-2 Vaccination 04/06/2021   Tdap 05/07/2020   Zoster Recombinat (Shingrix) 01/02/2019    TDAP status: Up to date  Flu Vaccine status: Up to date  Pneumococcal vaccine status: Up to date  Covid-19 vaccine status: Completed vaccines  Qualifies for Shingles Vaccine? Yes   Zostavax completed No   Shingrix Completed?: No.    Education has been provided regarding the importance of this vaccine. Patient has been advised to call insurance company to determine out of pocket expense if they have not yet received this vaccine. Advised may also receive vaccine at local pharmacy or Health Dept. Verbalized acceptance and understanding.  Screening Tests Health Maintenance  Topic Date Due   Zoster Vaccines- Shingrix (2 of 2) 02/27/2019   COVID-19 Vaccine (4 - Booster for [employer]  series) 06/01/2021   FOOT EXAM  10/24/2021   MAMMOGRAM  12/14/2022 (Originally 02/23/2007)   INFLUENZA VACCINE  12/31/2021   HEMOGLOBIN A1C  05/16/2022   OPHTHALMOLOGY EXAM  07/04/2022   COLONOSCOPY (Pts 45-56yr Insurance coverage will need to be confirmed)  10/15/2022   PAP SMEAR-Modifier  07/25/2023   TETANUS/TDAP  05/07/2030   Hepatitis C Screening  Completed   HIV Screening  Completed   HPV VACCINES  Aged Out    Health Maintenance  Health Maintenance Due  Topic Date Due   Zoster Vaccines- Shingrix (2 of 2) 02/27/2019   COVID-19 Vaccine (4 - Booster for Janssen series) 06/01/2021   FOOT EXAM  10/24/2021    Colorectal cancer screening: Type of screening: Colonoscopy. Completed 10/14/2017. Repeat every 5 years  Mammogram status: declines  Bone Density status: n/a  Lung Cancer Screening: (Low Dose CT Chest recommended if Age 65-80years, 30 pack-year currently smoking OR have quit w/in 15years.) does not qualify.   Lung Cancer Screening  Referral: no  Additional Screening:  Hepatitis C Screening: does qualify; Completed 08/09/2021  Vision Screening: Recommended annual ophthalmology exams for early detection of glaucoma and other disorders of the eye. Is the patient up to date with their annual eye exam?  Yes  Who is the provider or what is the name of the office in which the patient attends annual eye exams[facility] is not [facility] with a provider, would they like to be referred to a provider to establish care? No .   Dental Screening: Recommended annual dental exams for proper oral hygiene  Community Resource Referral / Chronic Care Management: CRR required this visit?  No   CCM required this visit?  No      Plan:     I have personally reviewed and noted the following in the patient's chart:   Medical and social history Use of alcohol, tobacco or illicit drugs  Current medications and supplements including opioid prescriptions. Patient is not currently taking opioid prescriptions. Functional ability and status Nutritional status Physical activity Advanced directives List of other physicians Hospitalizations, surgeries, and ER visits in previous 12 months Vitals Screenings to include cognitive, depression, and falls Referrals and appointments  In addition, I have reviewed and discussed with patient certain preventive protocols, quality metrics, and best practice recommendations. A written personalized care plan for preventive services as well as general preventive health recommendations were provided to patient.     NKellie Simmering LPN   [id number]  Nurse Notes: none  Due to this being a virtual visit, the after visit summary with patients personalized plan was offered to patient via mail or my-chart.  Patient would like to access on my-chart

## 2021-12-13 NOTE — Patient Instructions (Signed)
Ms. Strehle , Thank you for taking time to come for your Medicare Wellness Visit. I appreciate your ongoing commitment to your health goals. Please review the following plan we discussed and let me know if I can assist you in the future.   Screening recommendations/referrals: Colonoscopy: completed 10/14/2017, due 10/15/2022 Mammogram: decline Bone Density: n/a Recommended yearly ophthalmology/optometry visit for glaucoma screening and checkup Recommended yearly dental visit for hygiene and checkup  Vaccinations: Influenza vaccine: due 12/31/2021 Pneumococcal vaccine: n/a Tdap vaccine: completed 05/07/2020, due 05/07/2030 Shingles vaccine: needs second dose  Covid-19:  04/06/2021, 05/17/2020, 10/01/2019  Advanced directives: Advance directive discussed with you today.   Conditions/risks identified: smoking  Next appointment: Follow up in one year for your annual wellness visit.   Preventive Care 40-64 Years, Female Preventive care refers to lifestyle choices and visits with your health care provider that can promote health and wellness. What does preventive care include? A yearly physical exam. This is also called an annual well check. Dental exams once or twice a year. Routine eye exams. Ask your health care provider how often you should have your eyes checked. Personal lifestyle choices, including: Daily care of your teeth and gums. Regular physical activity. Eating a healthy diet. Avoiding tobacco and drug use. Limiting alcohol use. Practicing safe sex. Taking low-dose aspirin daily starting at age 62. Taking vitamin and mineral supplements as recommended by your health care provider. What happens during an annual well check? The services and screenings done by your health care provider during your annual well check will depend on your age, overall health, lifestyle risk factors, and family history of disease. Counseling  Your health care provider may ask you questions about  your: Alcohol use. Tobacco use. Drug use. Emotional well-being. Home and relationship well-being. Sexual activity. Eating habits. Work and work Statistician. Method of birth control. Menstrual cycle. Pregnancy history. Screening  You may have the following tests or measurements: Height, weight, and BMI. Blood pressure. Lipid and cholesterol levels. These may be checked every 5 years, or more frequently if you are over 34 years old. Skin check. Lung cancer screening. You may have this screening every year starting at age 37 if you have a 30-pack-year history of smoking and currently smoke or have quit within the past 15 years. Fecal occult blood test (FOBT) of the stool. You may have this test every year starting at age 54. Flexible sigmoidoscopy or colonoscopy. You may have a sigmoidoscopy every 5 years or a colonoscopy every 10 years starting at age 51. Hepatitis C blood test. Hepatitis B blood test. Sexually transmitted disease (STD) testing. Diabetes screening. This is done by checking your blood sugar (glucose) after you have not eaten for a while (fasting). You may have this done every 1-3 years. Mammogram. This may be done every 1-2 years. Talk to your health care provider about when you should start having regular mammograms. This may depend on whether you have a family history of breast cancer. BRCA-related cancer screening. This may be done if you have a family history of breast, ovarian, tubal, or peritoneal cancers. Pelvic exam and Pap test. This may be done every 3 years starting at age 61. Starting at age 15, this may be done every 5 years if you have a Pap test in combination with an HPV test. Bone density scan. This is done to screen for osteoporosis. You may have this scan if you are at high risk for osteoporosis. Discuss your test results, treatment options, and if necessary,  the need for more tests with your health care provider. Vaccines  Your health care provider may  recommend certain vaccines, such as: Influenza vaccine. This is recommended every year. Tetanus, diphtheria, and acellular pertussis (Tdap, Td) vaccine. You may need a Td booster every 10 years. Zoster vaccine. You may need this after age 14. Pneumococcal 13-valent conjugate (PCV13) vaccine. You may need this if you have certain conditions and were not previously vaccinated. Pneumococcal polysaccharide (PPSV23) vaccine. You may need one or two doses if you smoke cigarettes or if you have certain conditions. Talk to your health care provider about which screenings and vaccines you need and how often you need them. This information is not intended to replace advice given to you by your health care provider. Make sure you discuss any questions you have with your health care provider. Document Released: 06/15/2015 Document Revised: 02/06/2016 Document Reviewed: 03/20/2015 Elsevier Interactive Patient Education  2017 [place] Prevention in the Home Falls can cause injuries. They can happen to people of all ages. There are many things you can do to make your home safe and to help prevent falls. What can I do on the outside of my home? Regularly fix the edges of walkways and driveways and fix any cracks. Remove anything that might make you trip as you walk through a door, such as a raised step or threshold. Trim any bushes or trees on the path to your home. Use bright outdoor lighting. Clear any walking paths of anything that might make someone trip, such as rocks or tools. Regularly check to see if handrails are loose or broken. Make sure that both sides of any steps have handrails. Any raised decks and porches should have guardrails on the edges. Have any leaves, snow, or ice cleared regularly. Use sand or salt on walking paths during winter. Clean up any spills in your garage right away. This includes oil or grease spills. What can I do in the bathroom? Use night lights. Install  grab bars by the toilet and in the tub and shower. Do not use towel bars as grab bars. Use non-skid mats or decals in the tub or shower. If you need to sit down in the shower, use a plastic, non-slip stool. Keep the floor dry. Clean up any water that spills on the floor as soon as it happens. Remove soap buildup in the tub or shower regularly. Attach bath mats securely with double-sided non-slip rug tape. Do not have throw rugs and other things on the floor that can make you trip. What can I do in the bedroom? Use night lights. Make sure that you have a light by your bed that is easy to reach. Do not use any sheets or blankets that are too big for your bed. They should not hang down onto the floor. Have a firm chair that has side arms. You can use this for support while you get dressed. Do not have throw rugs and other things on the floor that can make you trip. What can I do in the kitchen? Clean up any spills right away. Avoid walking on wet floors. Keep items that you use a lot in easy-to-reach places. If you need to reach something above you, use a strong step stool that has a grab bar. Keep electrical cords out of the way. Do not use floor polish or wax that makes floors slippery. If you must use wax, use non-skid floor wax. Do not have throw rugs  and other things on the floor that can make you trip. What can I do with my stairs? Do not leave any items on the stairs. Make sure that there are handrails on both sides of the stairs and use them. Fix handrails that are broken or loose. Make sure that handrails are as long as the stairways. Check any carpeting to make sure that it is firmly attached to the stairs. Fix any carpet that is loose or worn. Avoid having throw rugs at the top or bottom of the stairs. If you do have throw rugs, attach them to the floor with carpet tape. Make sure that you have a light switch at the top of the stairs and the bottom of the stairs. If you do not have  them, ask someone to add them for you. What else can I do to help prevent falls? Wear shoes that: Do not have high heels. Have rubber bottoms. Are comfortable and fit you well. Are closed at the toe. Do not wear sandals. If you use a stepladder: Make sure that it is fully opened. Do not climb a closed stepladder. Make sure that both sides of the stepladder are locked into place. Ask someone to hold it for you, if possible. Clearly mark and make sure that you can see: Any grab bars or handrails. First and last steps. Where the edge of each step is. Use tools that help you move around (mobility aids) if they are needed. These include: Canes. Walkers. Scooters. Crutches. Turn on the lights when you go into a dark area. Replace any light bulbs as soon as they burn out. Set up your furniture so you have a clear path. Avoid moving your furniture around. If any of your floors are uneven, fix them. If there are any pets around you, be aware of where they are. Review your medicines with your doctor. Some medicines can make you feel dizzy. This can increase your chance of falling. Ask your doctor what other things that you can do to help prevent falls. This information is not intended to replace advice given to you by your health care provider. Make sure you discuss any questions you have with your health care provider. Document Released: 03/15/2009 Document Revised: 10/25/2015 Document Reviewed: 06/23/2014 Elsevier Interactive Patient Education  2017 Elsevier Inc.  

## 2021-12-16 ENCOUNTER — Telehealth: Payer: Self-pay | Admitting: Diagnostic Neuroimaging

## 2021-12-16 ENCOUNTER — Encounter: Payer: Self-pay | Admitting: Diagnostic Neuroimaging

## 2021-12-16 ENCOUNTER — Ambulatory Visit (INDEPENDENT_AMBULATORY_CARE_PROVIDER_SITE_OTHER): Payer: Medicare Other | Admitting: Diagnostic Neuroimaging

## 2021-12-16 ENCOUNTER — Telehealth: Payer: Medicare Other

## 2021-12-16 VITALS — BP 116/74 | HR 67 | Ht 67.0 in | Wt 236.0 lb

## 2021-12-16 DIAGNOSIS — R200 Anesthesia of skin: Secondary | ICD-10-CM

## 2021-12-16 DIAGNOSIS — E1142 Type 2 diabetes mellitus with diabetic polyneuropathy: Secondary | ICD-10-CM

## 2021-12-16 DIAGNOSIS — R251 Tremor, unspecified: Secondary | ICD-10-CM | POA: Diagnosis not present

## 2021-12-16 MED ORDER — AMITRIPTYLINE HCL 25 MG PO TABS: 25.0000 mg | ORAL_TABLET | Freq: Every day | ORAL | 3 refills | Status: DC

## 2021-12-16 NOTE — Telephone Encounter (Signed)
[employer] medicare/[place] med[facility] NPR sent to [facility]

## 2021-12-16 NOTE — Patient Instructions (Addendum)
PAIN (shooting, hot, electrical pain; arms and legs) - likely diabetic neuropathy - check B12 level - consider increase pregabalin up to '150mg'$  twice a day - add amitriptyline '25mg'$  at bedtime - consider capsaicin cream, lidocaine patch / cream, alpha-lipoic acid '600mg'$  daily  TREMOR / family hx MS / ear pain - check MRI brain w/wo (rule out demyelinating disease)

## 2021-12-16 NOTE — Progress Notes (Signed)
[facility]  PATIENT: Valerie Lamb DOB: 02-03-1956  REFERRING CLINICIAN: Lesleigh Noe, MD HISTORY FROM: patient  REASON FOR VISIT: new consult   HISTORICAL  CHIEF COMPLAINT:  Chief Complaint  Patient presents with   Numbness    Rm 7 with Pt is well, has been having a electrical/heat feeling in all extremities. Also having tremors in hands but majority L hand . This has been going on for about 7-8 yrs.     HISTORY OF PRESENT ILLNESS:   66 year old female here for evaluation of pain and tremors.  Has had diagnosis of diabetes for past 6 years or longer.  Started having pain, shooting electrical and burning sensations in arms and legs for 7 or 8 years.  Also has had some tremors in left greater than right upper extremities.  She tried gabapentin without relief.  Now on Lyrica 75 mg twice a day with mild benefit.  A1c 1 year ago was greater than 10.  Recent A1c is around 7.    REVIEW OF SYSTEMS: Full 14 system review of systems performed and negative with exception of: as per HPI.  ALLERGIES: Allergies  Allergen Reactions   Metformin Nausea And Vomiting    HOME MEDICATIONS: Outpatient Medications Prior to Visit  Medication Sig Dispense Refill   Accu-Chek FastClix Lancets MISC 1 Device by Does not apply route in the morning and at bedtime. Use as directed to check blood glucose twice daily 102 each 1   albuterol (VENTOLIN HFA) 108 (90 Base) MCG/ACT inhaler Inhale 1-2 puffs into the lungs every 6 (six) hours as needed for shortness of breath or wheezing.     atenolol (TENORMIN) 25 MG tablet Take 1 tablet (25 mg total) by mouth daily. 90 tablet 0   Continuous Blood Gluc Receiver (Sedan) DEVI 1 each by Does not apply route daily. 1 each 0   Continuous Blood Gluc Sensor (DEXCOM G7 SENSOR) MISC Apply sensor every 10 days as directed 3 each 2   Dulaglutide (TRULICITY) 3 PX/1.0GY SOPN Inject 3 mg as directed once a week. 6 mL 3   empagliflozin  (JARDIANCE) 25 MG TABS tablet Take 1 tablet (25 mg total) by mouth daily before breakfast. 90 tablet 3   ibuprofen (ADVIL) 800 MG tablet Take 1 tablet (800 mg total) by mouth every 8 (eight) hours. 30 tablet 1   levothyroxine (SYNTHROID) 100 MCG tablet Take 1 tablet (100 mcg total) by mouth daily before breakfast. 90 tablet 0   lisinopril (ZESTRIL) 10 MG tablet Take 1 tablet (10 mg total) by mouth daily. 90 tablet 0   Melatonin 10 MG TABS Take 10 mg by mouth at bedtime.     omeprazole (PRILOSEC) 40 MG capsule Take 1 capsule (40 mg total) by mouth 2 (two) times daily. 180 capsule 0   pregabalin (LYRICA) 75 MG capsule Take 1 capsule (75 mg total) by mouth 2 (two) times daily. 180 capsule 0   rosuvastatin (CRESTOR) 20 MG tablet Take 1 tablet (20 mg total) by mouth at bedtime. 90 tablet 0   Tiotropium Bromide Monohydrate 2.5 MCG/ACT AERS Inhale 2 puffs into the lungs daily. 3 each 0   varenicline (CHANTIX CONTINUING MONTH PAK) 1 MG tablet Take 1 tablet (1 mg total) by mouth 2 (two) times daily. 60 tablet 0   Hemoglobin A1c, HbA1c, Test (A1C TEST AT-HOME) KIT Use to test A1c at home 1 kit 0   insulin glargine (LANTUS SOLOSTAR) 100 UNIT/ML Solostar Pen Inject 20 Units  into the skin daily. 15 mL 1   Insulin Pen Needle 31G X 5 MM MISC Inject 1 Device into the skin daily in the afternoon. Use once daily to inject 10 units 100 each 3   No facility-administered medications prior to visit.    PAST MEDICAL HISTORY: Past Medical History:  Diagnosis Date   Anemia    Anxiety    Asthma    COPD (chronic obstructive pulmonary disease) ([place] [address] [place])    Depression    Diabetes mellitus without complication (HCC)    Endometriosis    Gastric ulcer    GERD (gastroesophageal reflux disease)    Hypertension    Hypothyroidism    Sleep apnea    Thyroid disease     PAST SURGICAL HISTORY: Past Surgical History:  Procedure Laterality Date   HYSTEROSCOPY WITH D & C N/A 08/23/2020   Procedure: DILATATION AND CURETTAGE  /HYSTEROSCOPY, polypectomy;  Surgeon: Homero Fellers, MD;  Location: [facility];  Service: Gynecology;  Laterality: N/A;   TUBAL LIGATION     VAGINAL HYSTERECTOMY Bilateral 02/26/2021   Procedure: HYSTERECTOMY VAGINAL;  Surgeon: Chancy Milroy, MD;  Location: [place];  Service: Gynecology;  Laterality: Bilateral;    FAMILY HISTORY: Family History  Problem Relation Age of Onset   Alcohol abuse Mother    Arthritis Mother    Asthma Mother    COPD Mother    Depression Mother    Drug abuse Mother    Early death Mother    Hyperlipidemia Mother    Hypertension Mother    Alcohol abuse Father    Cancer Father    Multiple sclerosis Maternal Aunt     SOCIAL HISTORY: Social History   Socioeconomic History   Marital status: Widowed    Spouse name: Not on file   Number of children: Not on file   Years of education: Not on file   Highest education level: Not on file  Occupational History   Not on file  Tobacco Use   Smoking status: Every Day    Packs/day: 0.25    Years: 46.00    Total pack years: 11.50    Types: Cigarettes    Start date: 1973   Smokeless tobacco: Never   Tobacco comments:    2 ppd previously, cut back 2021  Vaping Use   Vaping Use: Never used  Substance and Sexual Activity   Alcohol use: Never   Drug use: Never   Sexual activity: Not Currently  Other Topics Concern   Not on file  Social History Narrative   05/07/20   From: [place], [place]. Moved in 2017    Living: with husband, Eddie Dibbles (2005)   Work: disability due to back pain, [employer] prior to this      Family: son - Merry Proud (in [place]) , Barrett ([place]), Elberta Fortis (SIDS death), Danise Mina ([place]) - 3 grandchildren (one passed away)      Enjoys: short attention span, computer, knitting      Exercise: walking the dog   Diet: not currently, does not follow diabetic diet      Safety   Seat belts: Yes    Guns: No   Safe in relationships: Yes    Social Determinants of Health   Financial Resource Strain: Low Risk   (12/13/2021)   Overall Financial Resource Strain (CARDIA)    Difficulty of Paying Living Expenses: Not hard at all  Food Insecurity: No Food Insecurity (12/13/2021)   Hunger Vital Sign    Worried About Running Out  of Food in the Last Year: Never true    [place] in the Last Year: Never true  Transportation Needs: No Transportation Needs (12/13/2021)   PRAPARE - Hydrologist (Medical): No    Lack of Transportation (Non-Medical): No  Physical Activity: Insufficiently Active (12/13/2021)   Exercise Vital Sign    Days of Exercise per Week: 2 days    Minutes of Exercise per Session: 60 min  Stress: No Stress Concern Present (12/13/2021)   [place]    Feeling of Stress : Not at all  Social Connections: Not on file  Intimate Partner Violence: Not on file     PHYSICAL EXAM  GENERAL EXAM/CONSTITUTIONAL: Vitals:  Vitals:   12/16/21 0847  BP: 116/74  Pulse: 67  Weight: 236 lb (107 kg)  Height: 5' 7"  (1.702 m)   Body mass index is 36.96 kg/m. Wt Readings from Last 3 Encounters:  12/16/21 236 lb (107 kg)  12/13/21 205 lb (93 kg)  10/17/21 235 lb 1 oz (106.6 kg)   Patient is in no distress; well developed, nourished and groomed; neck is supple  CARDIOVASCULAR: Examination of carotid arteries is normal; no carotid bruits Regular rate and rhythm, no murmurs Examination of peripheral vascular system by observation and palpation is normal  EYES: Ophthalmoscopic exam of optic discs and posterior segments is normal; no papilledema or hemorrhages No results found.  MUSCULOSKELETAL: Gait, strength, tone, movements noted in Neurologic exam below  NEUROLOGIC: MENTAL STATUS:      No data to display         awake, alert, oriented to person, place and time recent and remote memory intact normal attention and concentration language fluent, comprehension intact, naming intact fund  of knowledge appropriate  CRANIAL NERVE:  2nd - no papilledema on fundoscopic exam 2nd, 3rd, 4th, 6th - pupils equal and reactive to light, visual fields full to confrontation, extraocular muscles intact, no nystagmus 5th - facial sensation symmetric 7th - facial strength symmetric 8th - hearing intact 9th - palate elevates symmetrically, uvula midline 11th - shoulder shrug symmetric 12th - tongue protrusion midline  MOTOR:  normal bulk and tone, full strength in the BUE, BLE  SENSORY:  normal and symmetric to light touch, pinprick, temperature, vibration; EXCEPT DECR IN LEFT KNEE AND RIGHT ARM  COORDINATION:  finger-nose-finger, fine finger movements normal; MILD DYSMETRIA IN LUE  REFLEXES:  deep tendon reflexes TRACE and symmetric  GAIT/STATION:  narrow based gait     DIAGNOSTIC DATA (LABS, IMAGING, TESTING) - I reviewed patient records, labs, notes, testing and imaging myself where available.  Lab Results  Component Value Date   WBC 8.9 02/27/2021   HGB 14.0 02/27/2021   HCT 42.3 02/27/2021   MCV 89.2 02/27/2021   PLT 170 02/27/2021      Component Value Date/Time   NA 141 08/09/2021 0857   K 4.0 08/09/2021 0857   CL 105 08/09/2021 0857   CO2 26 08/09/2021 0857   GLUCOSE 182 (H) 08/09/2021 0857   BUN 15 08/09/2021 0857   CREATININE 0.85 08/09/2021 0857   CALCIUM 9.6 08/09/2021 0857   PROT 6.7 01/15/2021 0856   ALBUMIN 3.9 01/15/2021 0856   AST 18 01/15/2021 0856   ALT 15 01/15/2021 0856   ALKPHOS 54 01/15/2021 0856   BILITOT 0.7 01/15/2021 0856   GFRNONAA >60 02/27/2021 0628   Lab Results  Component Value Date   CHOL 139 01/15/2021  HDL 33.60 (L) 01/15/2021   LDLCALC 66 01/15/2021   TRIG 194.0 (H) 01/15/2021   CHOLHDL 4 01/15/2021   Hemoglobin A1C  Date Value Ref Range Status  11/14/2021 7.1 (A) 4.0 - 5.6 % Final  07/22/2021 7.5 (A) 4.0 - 5.6 % Final  01/15/2021 7.2 (A) 4.0 - 5.6 % Final  10/09/2020 9.3 (A) 4.0 - 5.6 % Final   Hgb A1c MFr  Bld  Date Value Ref Range Status  07/12/2020 10.2 (H) 4.6 - 6.5 % Final    Comment:    Glycemic Control Guidelines for People with Diabetes:Non Diabetic:  <6%Goal of Therapy: <7%Additional Action Suggested:  >8%    No results found for: "VITAMINB12" Lab Results  Component Value Date   TSH 1.65 08/09/2021       ASSESSMENT AND PLAN  66 y.o. year old female here with:   Dx:  1. Diabetic polyneuropathy associated with type 2 diabetes mellitus ([place])   2. Tremor   3. Numbness      PLAN:  PAIN (shooting, hot, electrical pain; arms and legs) - likely diabetic neuropathy - check B12 level - consider increase pregabalin up to 132m twice a day - add amitriptyline 230mat bedtime - consider capsaicin cream, lidocaine patch / cream, alpha-lipoic acid 6005maily  TREMOR / family hx MS / ear pain - check MRI brain w/wo (rule out demyelinating disease)  Orders Placed This Encounter  Procedures   MR BRAIN W WO CONTRAST   Vitamin B12   Meds ordered this encounter  Medications   amitriptyline (ELAVIL) 25 MG tablet    Sig: Take 1 tablet (25 mg total) by mouth at bedtime.    Dispense:  30 tablet    Refill:  3   Return for pending if symptoms worsen or fail to improve, pending test results.    VIKPenni BombardD 12/15/81/4196:32:22 Certified in Neurology, Neurophysiology and Neuroimaging  [facility] [id number]uiDublineMarionC [id number]

## 2021-12-17 LAB — VITAMIN B12: Vitamin B-12: 463 pg/mL (ref 232–1245)

## 2021-12-25 ENCOUNTER — Other Ambulatory Visit: Payer: Self-pay

## 2021-12-25 MED ORDER — OMEPRAZOLE 40 MG PO CPDR: 40.0000 mg | DELAYED_RELEASE_CAPSULE | Freq: Two times a day (BID) | ORAL | 1 refills | Status: DC

## 2021-12-26 ENCOUNTER — Telehealth: Payer: Self-pay

## 2021-12-26 NOTE — Chronic Care Management (AMB) (Unsigned)
Chronic Care Management Pharmacy Assistant   Name: Valerie Lamb  MRN: [id number] DOB: [birth year]  Reason for Encounter: Medication Adherence and Delivery Coordination    Recent office visits:  7/14/23Pamala Hurry Allen,LPN(fam med)-Telemedicine AWV,discussed screenings, vaccines,diet, exercise,no medication changes   Recent consult visits:  12/16/21-Vikram Penumalli,MD-(neuro)Right arm numbness,- add amitriptyline '25mg'$  at bedtime,referral for Brain MRI, consider increase pregabalin up to '150mg'$  twice a day  Hospital visits:  None in previous 6 months  Medications: Outpatient Encounter Medications as of 12/26/2021  Medication Sig   Accu-Chek FastClix Lancets MISC 1 Device by Does not apply route in the morning and at bedtime. Use as directed to check blood glucose twice daily   albuterol (VENTOLIN HFA) 108 (90 Base) MCG/ACT inhaler Inhale 1-2 puffs into the lungs every 6 (six) hours as needed for shortness of breath or wheezing.   amitriptyline (ELAVIL) 25 MG tablet Take 1 tablet (25 mg total) by mouth at bedtime.   atenolol (TENORMIN) 25 MG tablet Take 1 tablet (25 mg total) by mouth daily.   Continuous Blood Gluc Receiver ([place]) [place] 1 each by Does not apply route daily.   Continuous Blood Gluc Sensor (DEXCOM G7 SENSOR) MISC Apply sensor every 10 days as directed   Dulaglutide (TRULICITY) 3 XB/1.4NW SOPN Inject 3 mg as directed once a week.   empagliflozin (JARDIANCE) 25 MG TABS tablet Take 1 tablet (25 mg total) by mouth daily before breakfast.   ibuprofen (ADVIL) 800 MG tablet Take 1 tablet (800 mg total) by mouth every 8 (eight) hours.   levothyroxine (SYNTHROID) 100 MCG tablet Take 1 tablet (100 mcg total) by mouth daily before breakfast.   lisinopril (ZESTRIL) 10 MG tablet Take 1 tablet (10 mg total) by mouth daily.   Melatonin 10 MG TABS Take 10 mg by mouth at bedtime.   omeprazole (PRILOSEC) 40 MG capsule Take 1 capsule (40 mg total) by mouth 2 (two) times daily.    pregabalin (LYRICA) 75 MG capsule Take 1 capsule (75 mg total) by mouth 2 (two) times daily.   rosuvastatin (CRESTOR) 20 MG tablet Take 1 tablet (20 mg total) by mouth at bedtime.   Tiotropium Bromide Monohydrate 2.5 MCG/ACT AERS Inhale 2 puffs into the lungs daily.   varenicline (CHANTIX CONTINUING MONTH PAK) 1 MG tablet Take 1 tablet (1 mg total) by mouth 2 (two) times daily.   No facility-administered encounter medications on file as of 12/26/2021.   BP Readings from Last 3 Encounters:  12/16/21 116/74  10/17/21 100/62  09/10/21 124/80    Lab Results  Component Value Date   HGBA1C 7.1 (A) 11/14/2021      Recent OV, Consult or Hospital visit:  12/16/21- Neurology- start amitriptyline Recent medication changes indicated:    Last adherence delivery date:12/09/21      Patient is due for next adherence delivery on: 01/07/22  Spoke with patient on 12/30/21 reviewed medications and coordinated delivery.  This delivery to include: Vials  30 Days   Lisinopril '10mg'$ - take 1 tablet at breakfast Omeprazole '40mg'$ - take 1 tablet breakfast, take 1 tablet bedtime  Rosuvastatin '20mg'$  -take 1 tablet bedtime Pregabalin '75mg'$ -take 1 tablet breakfast,take 1 tablet bedtime  Jardiance '25mg'$  -take 1 tablet at breakfast  Amitriptyline '25mg'$ . Take 1 tablet bedtime    Patient declined the following medications this month: Spiriva 2.5 mg - 2 puffs once daily -supply on hand  Trulicity '3mg'$ - inject once weekly - 3 pens left Dexcom sensors- just got sent  12/24/21 Varenicline '10mg'$ -  take 1 tablet breakfast, take 1 tablet bedtime- prn use  Atenolol '25mg'$ - take 1 tablet breakfast- patient has 60 pills left  Levothyroxine 161mg-take 1 tablet before breakfast- 40 pills left  Any concerns about your medications? No  How often do you forget or accidentally miss a dose? Rarely  the patient did forget a few morning pills due to she forgot about the packs   Do you use a pillbox? Yes  patient has pill caddy and and  organizer  Is patient in packaging Yes  Any concerns or issues with your packaging? The patient tried it for a month and did not like the packaging process, patient prefers to go back to a vial   Refills requested from providers include: Omeprazole   Confirmed delivery date of 01/07/22, advised patient that pharmacy will contact them the morning of delivery.  Recent blood pressure readings are as follows:none available    Recent blood glucose readings are as follows: none available    Annual wellness visit in last year? Yes Most Recent BP reading:116/74  67-P 12/16/21  If Diabetic: Most recent A1C reading:7.1  11/14/21 Last eye exam / retinopathy screening: UTD Last diabetic foot exam: overdue  [facility]. LCharlene Brooke CPP notified  VAvel Sensor [place] [phone]

## 2022-01-17 ENCOUNTER — Ambulatory Visit: Payer: Medicare Other | Admitting: Internal Medicine

## 2022-01-17 ENCOUNTER — Telehealth: Payer: Self-pay

## 2022-01-17 NOTE — Chronic Care Management (AMB) (Signed)
Patient contacted me this pm to ask for a refill on her trulicity and levothyroxine. Acute form uploaded to [facility] for delivery Monday   [name], CPP notified  Avel Sensor, [place]  [phone]

## 2022-01-20 ENCOUNTER — Other Ambulatory Visit: Payer: Self-pay

## 2022-01-20 DIAGNOSIS — E1165 Type 2 diabetes mellitus with hyperglycemia: Secondary | ICD-10-CM

## 2022-01-20 MED ORDER — TRULICITY 3 MG/0.5ML ~~LOC~~ SOAJ: 3.0000 mg | SUBCUTANEOUS | 1 refills | Status: DC

## 2022-01-27 ENCOUNTER — Telehealth: Payer: Self-pay

## 2022-01-27 DIAGNOSIS — G8929 Other chronic pain: Secondary | ICD-10-CM

## 2022-01-27 NOTE — Chronic Care Management (AMB) (Unsigned)
Chronic Care Management Pharmacy Assistant   Name: Valerie Lamb  MRN: [id number] DOB: Dec 11, 1955  Reason for Encounter: Medication Adherence and Delivery Coordination   Recent office visits:  None since last CCM contact  Recent consult visits:  None since last CCM contact  Hospital visits:  None in previous 6 months  Medications: Outpatient Encounter Medications as of 01/27/2022  Medication Sig   Accu-Chek FastClix Lancets MISC 1 Device by Does not apply route in the morning and at bedtime. Use as directed to check blood glucose twice daily   albuterol (VENTOLIN HFA) 108 (90 Base) MCG/ACT inhaler Inhale 1-2 puffs into the lungs every 6 (six) hours as needed for shortness of breath or wheezing.   amitriptyline (ELAVIL) 25 MG tablet Take 1 tablet (25 mg total) by mouth at bedtime.   atenolol (TENORMIN) 25 MG tablet Take 1 tablet (25 mg total) by mouth daily.   Continuous Blood Gluc Receiver ([place]) [place] 1 each by Does not apply route daily.   Continuous Blood Gluc Sensor (DEXCOM G7 SENSOR) MISC Apply sensor every 10 days as directed   Dulaglutide (TRULICITY) 3 HB/7.1IR SOPN Inject 3 mg as directed once a week.   empagliflozin (JARDIANCE) 25 MG TABS tablet Take 1 tablet (25 mg total) by mouth daily before breakfast.   ibuprofen (ADVIL) 800 MG tablet Take 1 tablet (800 mg total) by mouth every 8 (eight) hours.   levothyroxine (SYNTHROID) 100 MCG tablet Take 1 tablet (100 mcg total) by mouth daily before breakfast.   lisinopril (ZESTRIL) 10 MG tablet Take 1 tablet (10 mg total) by mouth daily.   Melatonin 10 MG TABS Take 10 mg by mouth at bedtime.   omeprazole (PRILOSEC) 40 MG capsule Take 1 capsule (40 mg total) by mouth 2 (two) times daily.   pregabalin (LYRICA) 75 MG capsule Take 1 capsule (75 mg total) by mouth 2 (two) times daily.   rosuvastatin (CRESTOR) 20 MG tablet Take 1 tablet (20 mg total) by mouth at bedtime.   Tiotropium Bromide Monohydrate 2.5 MCG/ACT AERS  Inhale 2 puffs into the lungs daily.   varenicline (CHANTIX CONTINUING MONTH PAK) 1 MG tablet Take 1 tablet (1 mg total) by mouth 2 (two) times daily.   No facility-administered encounter medications on file as of 01/27/2022.   BP Readings from Last 3 Encounters:  12/16/21 116/74  10/17/21 100/62  09/10/21 124/80    Lab Results  Component Value Date   HGBA1C 7.1 (A) 11/14/2021      No OVs, Consults, or hospital visits since last care coordination call / Pharmacist visit. No medication changes indicated   Last adherence delivery date:01/07/22      Patient is due for next adherence delivery on: 02/05/22  Spoke with patient on 01/28/22 reviewed medications and coordinated delivery.  This delivery to include: Vials  30 Days VIAL medications: Lisinopril '10mg'$ - take 1 tablet at breakfast Omeprazole '40mg'$ - take 1 tablet breakfast, take 1 tablet bedtime  Rosuvastatin '20mg'$  -take 1 tablet bedtime Pregabalin '75mg'$ -take 1 tablet breakfast,take 1 tablet bedtime  Jardiance '25mg'$  -take 1 tablet at breakfast  Amitriptyline '25mg'$ . Take 1 tablet bedtime  Levothyroxine 124mg-take 1 tablet before breakfast Ibuprofen '800mg'$  -take 1 tablet every 8 hours as needed for pain   Patient declined the following medications this month: Atenolol '25mg'$ - take 1 tablet breakfast- patient has 60 pills left  Varenicline '10mg'$ - take 1 tablet breakfast, take 1 tablet bedtime- prn use  Spiriva 2.5 mg - 2 puffs once daily -  supply on hand  Dexcom sensors- due 4/82/50 Trulicity  '3mg'$ - inject once weekly- due 02/17/22  Any concerns about your medications? No  How often do you forget or accidentally miss a dose? Rarely  Do you use a pillbox? Yes patient has pill caddy and organizer   Is patient in packaging No   Refills requested from providers include: Lisinopril, pregabalin, rosuvastatin   Confirmed delivery date of 02/05/22, advised patient that pharmacy will contact them the morning of delivery.  Recent blood  pressure readings are as follows:none available   Recent blood glucose readings are as follows:none available     Annual wellness visit in last year? Yes Most Recent BP reading:116/74  If Diabetic: Most recent A1C reading:7.1  11/14/21 Last eye exam / retinopathy screening: UTD Last diabetic foot exam: overdue  Cycle dispensing form sent to [facility], CPP for review.  Patient requests rx for Ibuprofen '800mg'$  -take 1 tablet every 8 hours as needed for pain  #30.  [name], CPP notified  Avel Sensor, [place]  [phone]

## 2022-01-29 ENCOUNTER — Other Ambulatory Visit: Payer: Self-pay | Admitting: *Deleted

## 2022-01-29 DIAGNOSIS — E785 Hyperlipidemia, unspecified: Secondary | ICD-10-CM

## 2022-01-29 DIAGNOSIS — I152 Hypertension secondary to endocrine disorders: Secondary | ICD-10-CM

## 2022-01-29 DIAGNOSIS — G629 Polyneuropathy, unspecified: Secondary | ICD-10-CM

## 2022-01-29 MED ORDER — LISINOPRIL 10 MG PO TABS: 10.0000 mg | ORAL_TABLET | Freq: Every day | ORAL | 1 refills | Status: DC

## 2022-01-29 MED ORDER — ROSUVASTATIN CALCIUM 20 MG PO TABS: 20.0000 mg | ORAL_TABLET | Freq: Every day | ORAL | 0 refills | Status: DC

## 2022-01-29 NOTE — Telephone Encounter (Signed)
Last office visit 10/17/21 for Tremor.  Last refilled 11/18/21 for #180 with no refills.  Next Appt: 02/17/22.

## 2022-01-30 NOTE — Telephone Encounter (Addendum)
Patient requesting refill for ibuprofen 800 mg for ongoing leg pain.  Last Rx 08/13/21 - #30 with 1 RF. Filled at pharmacy 08/13/21 and 09/12/21.   Routing to PCP for approval.   [facility] - [place], [place] - [address] [place] [address] Phone: [phone] Fax: [phone]

## 2022-01-31 MED ORDER — IBUPROFEN 800 MG PO TABS: 800.0000 mg | ORAL_TABLET | Freq: Three times a day (TID) | ORAL | 1 refills | Status: DC

## 2022-01-31 MED ORDER — PREGABALIN 75 MG PO CAPS: 75.0000 mg | ORAL_CAPSULE | Freq: Two times a day (BID) | ORAL | 0 refills | Status: DC

## 2022-01-31 NOTE — Telephone Encounter (Signed)
Refill sent.

## 2022-01-31 NOTE — Addendum Note (Signed)
Addended by: Lesleigh Noe on: 01/31/2022 07:47 AM   Modules accepted: Orders

## 2022-02-17 ENCOUNTER — Ambulatory Visit (INDEPENDENT_AMBULATORY_CARE_PROVIDER_SITE_OTHER): Payer: Medicare Other | Admitting: Family Medicine

## 2022-02-17 VITALS — BP 110/70 | HR 64 | Temp 97.3°F | Ht 67.0 in | Wt 233.1 lb

## 2022-02-17 DIAGNOSIS — E1122 Type 2 diabetes mellitus with diabetic chronic kidney disease: Secondary | ICD-10-CM | POA: Diagnosis not present

## 2022-02-17 DIAGNOSIS — Z5320 Procedure and treatment not carried out because of patient's decision for unspecified reasons: Secondary | ICD-10-CM

## 2022-02-17 DIAGNOSIS — E785 Hyperlipidemia, unspecified: Secondary | ICD-10-CM

## 2022-02-17 DIAGNOSIS — Z23 Encounter for immunization: Secondary | ICD-10-CM | POA: Diagnosis not present

## 2022-02-17 DIAGNOSIS — J449 Chronic obstructive pulmonary disease, unspecified: Secondary | ICD-10-CM

## 2022-02-17 DIAGNOSIS — Z0000 Encounter for general adult medical examination without abnormal findings: Secondary | ICD-10-CM

## 2022-02-17 DIAGNOSIS — E1169 Type 2 diabetes mellitus with other specified complication: Secondary | ICD-10-CM

## 2022-02-17 LAB — COMPREHENSIVE METABOLIC PANEL
ALT: 24 U/L (ref 0–35)
AST: 24 U/L (ref 0–37)
Albumin: 4 g/dL (ref 3.5–5.2)
Alkaline Phosphatase: 105 U/L (ref 39–117)
BUN: 17 mg/dL (ref 6–23)
CO2: 26 mEq/L (ref 19–32)
Calcium: 9.4 mg/dL (ref 8.4–10.5)
Chloride: 105 mEq/L (ref 96–112)
Creatinine, Ser: 0.91 mg/dL (ref 0.40–1.20)
GFR: 66.45 mL/min (ref >60.00)
Glucose, Bld: 92 mg/dL (ref 70–99)
Potassium: 4.1 mEq/L (ref 3.5–5.1)
Sodium: 140 mEq/L (ref 135–145)
Total Bilirubin: 0.5 mg/dL (ref 0.2–1.2)
Total Protein: 6.6 g/dL (ref 6.0–8.3)

## 2022-02-17 LAB — CBC
HCT: 49.5 % — ABNORMAL HIGH (ref 36.0–46.0)
Hemoglobin: 16.7 g/dL — ABNORMAL HIGH (ref 12.0–15.0)
MCHC: 33.8 g/dL (ref 30.0–36.0)
MCV: 88.8 fl (ref 78.0–100.0)
Platelets: 147 10*3/uL — ABNORMAL LOW (ref 150.0–400.0)
RBC: 5.57 Mil/uL — ABNORMAL HIGH (ref 3.87–5.11)
RDW: 14.7 % (ref 11.5–15.5)
WBC: 5.8 10*3/uL (ref 4.0–10.5)

## 2022-02-17 LAB — POCT GLYCOSYLATED HEMOGLOBIN (HGB A1C): Hemoglobin A1C: 7.3 % — AB (ref 4.0–5.6)

## 2022-02-17 LAB — LIPID PANEL
Cholesterol: 133 mg/dL (ref 0–200)
HDL: 46.7 mg/dL (ref >39.00)
LDL Cholesterol: 59 mg/dL (ref 0–99)
NonHDL: 85.98
Total CHOL/HDL Ratio: 3
Triglycerides: 133 mg/dL (ref 0.0–149.0)
VLDL: 26.6 mg/dL (ref 0.0–40.0)

## 2022-02-17 LAB — MICROALBUMIN / CREATININE URINE RATIO
Creatinine,U: 74.1 mg/dL
Microalb Creat Ratio: 0.9 mg/g (ref 0.0–30.0)
Microalb, Ur: <0.7 mg/dL (ref 0.0–1.9)

## 2022-02-17 NOTE — Progress Notes (Signed)
Annual Exam   Chief Complaint:  Chief Complaint  Patient presents with   Medicare Wellness    Pt 2. Pt would like to transfer to [facility]    History of Present Illness:  Ms. Valerie Lamb is a 66 y.o. [phone] who LMP was No LMP recorded. Patient is postmenopausal., presents today for her annual examination.    #DM - not doing as good with diet - lots of birthdays and cakes  Neuropathy Continues to have leg and arm pain  Nutrition She does get adequate calcium and Vitamin D in her diet. Diet: healthy Exercise: [employer]ca twice a week, water aerobics, caring for puppy    Social History   Tobacco Use  Smoking Status Every Day   Packs/day: 0.25   Years: 46.00   Total pack years: 11.50   Types: Cigarettes   Start date: 1973  Smokeless Tobacco Never  Tobacco Comments   2 ppd previously, cut back 2021   Social History   Substance and Sexual Activity  Alcohol Use Never   Social History   Substance and Sexual Activity  Drug Use Never     General Health Dentist in the last year: dentures Eye doctor: yes  Safety The patient wears seatbelts: yes.     The patient feels safe at home and in their relationships: yes.   Menstrual:  Symptoms of menopause: no  GYN She is not sexually active.    Cervical Cancer Screening (21-65):   Last EVO:[id number] with OB/GYN s/p hystectomy due to abnormal pap   Breast Cancer Screening (Age 32-74):  There is FH of breast cancer. There is no FH of ovarian cancer. BRCA screening Not Indicated.  Last Mammogram: declined mammogram The patient does not want a mammogram this year.    Colon Cancer Screening:  Age 33-75 yo - benefits outweigh the risk. Adults 26-85 yo who have never been screened benefit.  Benefits: 134000 people in 2016 will be diagnosed and 49,000 will die - early detection helps [place]: Complications 2/2 to colonoscopy High Risk (Colonoscopy): genetic disorder (Lynch syndrome or familial adenomatous polyposis),  personal hx of IBD, previous adenomatous polyp, or previous colorectal cancer, FamHx start 10 years before the age at diagnosis, increased in males and black race  Options:  FIT - looks for hemoglobin (blood in the stool) - specific and fairly sensitive - must be done annually Cologuard - looks for DNA and blood - more sensitive - therefore can have more false positives, every 3 years Colonoscopy - every 10 years if normal - sedation, bowl prep, must have someone drive you  Shared decision making and the patient had decided to do colonoscopy 2024.   Social History   Tobacco Use  Smoking Status Every Day   Packs/day: 0.25   Years: 46.00   Total pack years: 11.50   Types: Cigarettes   Start date: 1973  Smokeless Tobacco Never  Tobacco Comments   2 ppd previously, cut back 2021    Lung Cancer Screening (Ages 68-80): yes 20 year pack history? Yes Current Tobacco user? Yes Quit less than 15 years ago? No Interested in low dose CT for lung cancer screening? yes  Weight Wt Readings from Last 3 Encounters:  02/17/22 233 lb 2 oz (105.7 kg)  12/16/21 236 lb (107 kg)  12/13/21 205 lb (93 kg)   Patient has high BMI  BMI Readings from Last 1 Encounters:  02/17/22 36.51 kg/m     Chronic disease screening Blood pressure monitoring:  BP Readings  from Last 3 Encounters:  02/17/22 110/70  12/16/21 116/74  10/17/21 100/62    Lipid Monitoring: Indication for screening: age >73, obesity, diabetes, family hx, CV risk factors.  Lipid screening: Yes  Lab Results  Component Value Date   CHOL 139 01/15/2021   HDL 33.60 (L) 01/15/2021   LDLCALC 66 01/15/2021   TRIG 194.0 (H) 01/15/2021   CHOLHDL 4 01/15/2021     Diabetes Screening: age >25, overweight, family hx, PCOS, hx of gestational diabetes, at risk ethnicity Diabetes Screening screening: Yes  Lab Results  Component Value Date   HGBA1C 7.3 (A) 02/17/2022     Past Medical History:  Diagnosis Date   Anemia     Anxiety    Asthma    COPD (chronic obstructive pulmonary disease) ([place])    Depression    Diabetes mellitus without complication ([place])    Endometriosis    Gastric ulcer    GERD (gastroesophageal reflux disease)    Hypertension    Hypothyroidism    Sleep apnea    Thyroid disease     Past Surgical History:  Procedure Laterality Date   HYSTEROSCOPY WITH D & C N/A 08/23/2020   Procedure: DILATATION AND CURETTAGE /HYSTEROSCOPY, polypectomy;  Surgeon: Homero Fellers, MD;  Location: [facility];  Service: Gynecology;  Laterality: N/A;   TUBAL LIGATION     VAGINAL HYSTERECTOMY Bilateral 02/26/2021   Procedure: HYSTERECTOMY VAGINAL;  Surgeon: Chancy Milroy, MD;  Location: [facility];  Service: Gynecology;  Laterality: Bilateral;    Prior to Admission medications   Medication Sig Start Date End Date Taking? Authorizing Provider  Accu-Chek FastClix Lancets MISC 1 Device by Does not apply route in the morning and at bedtime. Use as directed to check blood glucose twice daily 05/21/21  Yes Lesleigh Noe, MD  albuterol (VENTOLIN HFA) 108 (90 Base) MCG/ACT inhaler Inhale 1-2 puffs into the lungs every 6 (six) hours as needed for shortness of breath or wheezing.   Yes [provider]  amitriptyline (ELAVIL) 25 MG tablet Take 1 tablet (25 mg total) by mouth at bedtime. 12/16/21  Yes Penumalli, Earlean Polka, MD  atenolol (TENORMIN) 25 MG tablet Take 1 tablet (25 mg total) by mouth daily. 12/02/21  Yes Lesleigh Noe, MD  Continuous Blood Gluc Receiver ([place]) [place] 1 each by Does not apply route daily. 09/20/21  Yes Lesleigh Noe, MD  Continuous Blood Gluc Sensor (DEXCOM G7 SENSOR) MISC Apply sensor every 10 days as directed 09/23/21  Yes Lesleigh Noe, MD  Dulaglutide (TRULICITY) 3 JQ/7.3AL SOPN Inject 3 mg as directed once a week. 01/20/22  Yes Lesleigh Noe, MD  empagliflozin (JARDIANCE) 25 MG TABS tablet Take 1 tablet (25 mg total) by mouth daily before breakfast. 09/10/21  Yes  Shamleffer, Melanie Crazier, MD  ibuprofen (ADVIL) 800 MG tablet Take 1 tablet (800 mg total) by mouth every 8 (eight) hours. 01/31/22  Yes Lesleigh Noe, MD  levothyroxine (SYNTHROID) 100 MCG tablet Take 1 tablet (100 mcg total) by mouth daily before breakfast. 09/19/21  Yes Lesleigh Noe, MD  lisinopril (ZESTRIL) 10 MG tablet Take 1 tablet (10 mg total) by mouth daily. 01/29/22  Yes Lesleigh Noe, MD  Melatonin 10 MG TABS Take 10 mg by mouth at bedtime.   Yes [provider]  omeprazole (PRILOSEC) 40 MG capsule Take 1 capsule (40 mg total) by mouth 2 (two) times daily. 12/25/21  Yes Lesleigh Noe, MD  pregabalin (LYRICA) 75  MG capsule Take 1 capsule (75 mg total) by mouth 2 (two) times daily. 01/31/22  Yes Lesleigh Noe, MD  rosuvastatin (CRESTOR) 20 MG tablet Take 1 tablet (20 mg total) by mouth at bedtime. 01/29/22  Yes Lesleigh Noe, MD  Tiotropium Bromide Monohydrate 2.5 MCG/ACT AERS Inhale 2 puffs into the lungs daily. 09/19/21  Yes Lesleigh Noe, MD  varenicline (CHANTIX CONTINUING MONTH PAK) 1 MG tablet Take 1 tablet (1 mg total) by mouth 2 (two) times daily. 09/19/21  Yes Lesleigh Noe, MD    Allergies  Allergen Reactions   Metformin Nausea And Vomiting    Gynecologic History: No LMP recorded. Patient is postmenopausal.  Obstetric History: [id number]  Social History   Socioeconomic History   Marital status: Widowed    Spouse name: Not on file   Number of children: Not on file   Years of education: Not on file   Highest education level: Not on file  Occupational History   Not on file  Tobacco Use   Smoking status: Every Day    Packs/day: 0.25    Years: 46.00    Total pack years: 11.50    Types: Cigarettes    Start date: 1973   Smokeless tobacco: Never   Tobacco comments:    2 ppd previously, cut back 2021  Vaping Use   Vaping Use: Never used  Substance and Sexual Activity   Alcohol use: Never   Drug use: Never   Sexual activity: Not Currently  Other  Topics Concern   Not on file  Social History Narrative   05/07/20   From: [place], [place]. Moved in 2017    Living: with husband, Eddie Dibbles (2005)   Work: disability due to back pain, [employer] prior to this      Family: son - Merry Proud (in [place]) , [place] ([place]), Elberta Fortis (SIDS death), [place] ([place]) - 3 grandchildren (one passed away)      Enjoys: short attention span, computer, knitting      Exercise: walking the dog   Diet: not currently, does not follow diabetic diet      Safety   Seat belts: Yes    Guns: No   Safe in relationships: Yes    Social Determinants of Health   Financial Resource Strain: Low Risk  (12/13/2021)   Overall Financial Resource Strain (CARDIA)    Difficulty of Paying Living Expenses: Not hard at all  Food Insecurity: No Food Insecurity (12/13/2021)   Hunger Vital Sign    Worried About Running Out of Food in the Last Year: Never true    [place] in the Last Year: Never true  Transportation Needs: No Transportation Needs (12/13/2021)   PRAPARE - Hydrologist (Medical): No    Lack of Transportation (Non-Medical): No  Physical Activity: Insufficiently Active (12/13/2021)   Exercise Vital Sign    Days of Exercise per Week: 2 days    Minutes of Exercise per Session: 60 min  Stress: No Stress Concern Present (12/13/2021)   [place]    Feeling of Stress : Not at all  Social Connections: Not on file  Intimate Partner Violence: Not on file    Family History  Problem Relation Age of Onset   Alcohol abuse Mother    Arthritis Mother    Asthma Mother    COPD Mother    Depression Mother    Drug abuse Mother  Early death Mother    Hyperlipidemia Mother    Hypertension Mother    Alcohol abuse Father    Cancer Father    Multiple sclerosis Maternal Aunt     Review of Systems  Constitutional:  Negative for chills and fever.  HENT:  Negative for congestion and sore throat.    Eyes:  Negative for blurred vision and double vision.  Respiratory:  Negative for shortness of breath.   Cardiovascular:  Negative for chest pain.  Gastrointestinal:  Negative for heartburn, nausea and vomiting.  Genitourinary: Negative.   Musculoskeletal: Negative.  Negative for myalgias.  Skin:  Negative for rash.  Neurological:  Negative for dizziness and headaches.  Endo/Heme/Allergies:  Does not bruise/bleed easily.  Psychiatric/Behavioral:  Negative for depression. The patient is not nervous/anxious.      Physical Exam BP 110/70   Pulse 64   Temp (!) 97.3 F (36.3 C) (Temporal)   Ht 5' 7"  (1.702 m)   Wt 233 lb 2 oz (105.7 kg)   SpO2 97%   BMI 36.51 kg/m    BP Readings from Last 3 Encounters:  02/17/22 110/70  12/16/21 116/74  10/17/21 100/62      Physical Exam Constitutional:      General: She is not in acute distress.    Appearance: She is well-developed. She is not diaphoretic.  HENT:     Head: Normocephalic and atraumatic.     Right Ear: External ear normal.     Left Ear: External ear normal.     Nose: Nose normal.  Eyes:     General: No scleral icterus.    Extraocular Movements: Extraocular movements intact.     Conjunctiva/sclera: Conjunctivae normal.  Cardiovascular:     Rate and Rhythm: Normal rate and regular rhythm.     Heart sounds: No murmur heard. Pulmonary:     Effort: Pulmonary effort is normal. No respiratory distress.     Breath sounds: Normal breath sounds. No wheezing.  Abdominal:     General: Bowel sounds are normal. There is no distension.     Palpations: Abdomen is soft. There is no mass.     Tenderness: There is no abdominal tenderness. There is no guarding or rebound.  Musculoskeletal:        General: Normal range of motion.     Cervical back: Neck supple.  Lymphadenopathy:     Cervical: No cervical adenopathy.  Skin:    General: Skin is warm and dry.     Capillary Refill: Capillary refill takes less than 2 seconds.   Neurological:     Mental Status: She is alert and oriented to person, place, and time.     Deep Tendon Reflexes: Reflexes normal.  Psychiatric:        Mood and Affect: Mood normal.        Behavior: Behavior normal.     Results:  PHQ-9:  [place] Office Visit from 02/17/2022 in [facility]  PHQ-9 Total Score 4         Assessment: 66 y.o. [phone] female here for routine annual physical examination.  Plan: Problem List Items Addressed This Visit       Respiratory   COPD (chronic obstructive pulmonary disease) ([place])   Relevant Orders   CBC     Endocrine   Type 2 diabetes mellitus with diabetic chronic kidney disease ([place])    Lab Results  Component Value Date   HGBA1C 7.3 (A) 02/17/2022  Slightly elevated in the  setting of poor diet adherence.  She will work on diet, continue Trulicity 3 mg weekly, Jardiance 25 mg daily.  Return in 3 months.  Did briefly discuss weight loss benefit of Mounjaro versus Ozempic, if still elevated at next appointment may consider transitioning to 1 of these if covered by insurance.       Relevant Orders   POCT glycosylated hemoglobin (Hb A1C) (Completed)   Comprehensive metabolic panel   Microalbumin / creatinine urine ratio   Hyperlipidemia associated with type 2 diabetes mellitus ([place])   Relevant Orders   Lipid panel     Other   Screening mammography declined    Sister w/ hx. No interested in treating or diagnosis.       Other Visit Diagnoses     Annual physical exam    -  Primary   Need for influenza vaccination       Relevant Orders   Flu Vaccine QUAD 32moIM (Fluarix, Fluzone & Alfiuria Quad PF) (Completed)       Screening: -- Blood pressure screen normal -- cholesterol screening: not due for screening -- Weight screening: overweight: continue to monitor -- Diabetes Screening:  See plan -- Nutrition: Encouraged healthy diet  The ASCVD Risk score (Arnett DK, et al., 2019) failed to  calculate for the following reasons:   Unable to determine if patient is Non-Hispanic African American  -- Statin therapy for Age 65-75with CVD risk >7.5%  Psych -- Depression screening (PHQ-9):  [place] Total Score 4        Safety -- tobacco screening:  working on cutting back -- alcohol screening:  low-risk usage. -- no evidence of domestic violence or intimate partner violence.   Cancer Screening -- pap smear not collected per ASCCP guidelines -- family history of breast cancer screening: done. not at high risk. -- Mammogram -  declined -- Colon cancer (age 65+--  not yet due  Immunizations Immunization History  Administered Date(s) Administered   Influenza,inj,Quad PF,6+ Mos 02/22/2020, 03/25/2021, 02/17/2022   Janssen (J&J) SARS-COV-2 Vaccination 10/01/2019   PFIZER Comirnaty(Gray Top)Covid-19 Tri-Sucrose Vaccine 05/17/2020   PFIZER(Purple Top)SARS-COV-2 Vaccination 04/06/2021   Tdap 05/07/2020   Zoster Recombinat (Shingrix) 01/02/2019    -- flu vaccine up to date -- TDAP q10 years up to date -- Shingles (age >50) not up to date - incomplete - severe side effects -- PPSV-23 (19-64 with chronic disease or smoking) unknown, record requested -- Covid-19 Vaccine up to date   Encouraged healthy diet and exercise. Encouraged regular vision and dental care.    JLesleigh Noe MD

## 2022-02-17 NOTE — Assessment & Plan Note (Signed)
Sister w/ hx. No interested in treating or diagnosis.

## 2022-02-17 NOTE — Patient Instructions (Addendum)
Labs today  Try to work on diet   Biotin - 3-6 months then stop

## 2022-02-17 NOTE — Assessment & Plan Note (Signed)
Lab Results  Component Value Date   HGBA1C 7.3 (A) 02/17/2022  Slightly elevated in the setting of poor diet adherence.  She will work on diet, continue Trulicity 3 mg weekly, Jardiance 25 mg daily.  Return in 3 months.  Did briefly discuss weight loss benefit of Mounjaro versus Ozempic, if still elevated at next appointment may consider transitioning to 1 of these if covered by insurance.

## 2022-02-25 ENCOUNTER — Telehealth: Payer: Self-pay

## 2022-02-25 NOTE — Chronic Care Management (AMB) (Signed)
Chronic Care Management Pharmacy Assistant   Name: Valerie Lamb  MRN: [id number] DOB: August 16, 1955   Reason for Encounter: Medication Adherence and Delivery Coordination     Recent office visits:  02/17/22-Jessica Cody,MD(PCP)-[facility],discussed vaccines, screenings, nutrition,Labs(olesterol looks good. No protein in the urine. Liver and kidney function is good). Declined mammogram,colonoscopy planned for 2024, Did briefly discuss weight loss benefit of Mounjaro versus Ozempic, if still elevated at next appointment may consider transitioning to 1 of these if covered by insurance. Flu shot given,Biotin 3-6 months then stop,f/u 3 months.  Recent consult visits:  None since last CCM contact  Hospital visits:  None in previous 6 months  Medications: Outpatient Encounter Medications as of 02/25/2022  Medication Sig   Accu-Chek FastClix Lancets MISC 1 Device by Does not apply route in the morning and at bedtime. Use as directed to check blood glucose twice daily   albuterol (VENTOLIN HFA) 108 (90 Base) MCG/ACT inhaler Inhale 1-2 puffs into the lungs every 6 (six) hours as needed for shortness of breath or wheezing.   amitriptyline (ELAVIL) 25 MG tablet Take 1 tablet (25 mg total) by mouth at bedtime.   atenolol (TENORMIN) 25 MG tablet Take 1 tablet (25 mg total) by mouth daily.   Continuous Blood Gluc Receiver (Melbeta) [place] 1 each by Does not apply route daily.   Continuous Blood Gluc Sensor (DEXCOM G7 SENSOR) MISC Apply sensor every 10 days as directed   Dulaglutide (TRULICITY) 3 QI/3.4VQ SOPN Inject 3 mg as directed once a week.   empagliflozin (JARDIANCE) 25 MG TABS tablet Take 1 tablet (25 mg total) by mouth daily before breakfast.   ibuprofen (ADVIL) 800 MG tablet Take 1 tablet (800 mg total) by mouth every 8 (eight) hours.   levothyroxine (SYNTHROID) 100 MCG tablet Take 1 tablet (100 mcg total) by mouth daily before breakfast.   lisinopril (ZESTRIL) 10 MG tablet Take 1 tablet  (10 mg total) by mouth daily.   Melatonin 10 MG TABS Take 10 mg by mouth at bedtime.   omeprazole (PRILOSEC) 40 MG capsule Take 1 capsule (40 mg total) by mouth 2 (two) times daily.   pregabalin (LYRICA) 75 MG capsule Take 1 capsule (75 mg total) by mouth 2 (two) times daily.   rosuvastatin (CRESTOR) 20 MG tablet Take 1 tablet (20 mg total) by mouth at bedtime.   Tiotropium Bromide Monohydrate 2.5 MCG/ACT AERS Inhale 2 puffs into the lungs daily.   varenicline (CHANTIX CONTINUING MONTH PAK) 1 MG tablet Take 1 tablet (1 mg total) by mouth 2 (two) times daily.   No facility-administered encounter medications on file as of 02/25/2022.   BP Readings from Last 3 Encounters:  02/17/22 110/70  12/16/21 116/74  10/17/21 100/62    Lab Results  Component Value Date   HGBA1C 7.3 (A) 02/17/2022      Recent OV, Consult or Hospital visit:  02/17/22-PCP-[facility] No medication changes indicated   Last adherence delivery date:02/05/22      Patient is due for next adherence delivery on: 03/07/22  Spoke with patient on 02/25/22 reviewed medications and coordinated delivery.  This delivery to include: Vials  30 Days   No safety caps  VIAL medications: Lisinopril '10mg'$ - take 1 tablet at breakfast Omeprazole '40mg'$ - take 1 tablet breakfast, take 1 tablet bedtime  Rosuvastatin '20mg'$  -take 1 tablet bedtime Pregabalin '75mg'$ -take 1 tablet breakfast,take 1 tablet bedtime  Jardiance '25mg'$  -take 1 tablet at breakfast  Amitriptyline '25mg'$ . Take 1 tablet bedtime  Levothyroxine 152mg-take 1  tablet before breakfast Ibuprofen '800mg'$  -take 1 tablet every 8 hours as needed for pain    Patient declined the following medications this month: Dexcom sensors- due 97/58/83 Trulicity  '3mg'$ - inject once weekly- due 03/14/22 Spiriva 2.5 mg - 2 puffs once daily -supply on hand  Atenolol '25mg'$ -take 1 tablet daily- pt received already   Any concerns about your medications? No  How often do you forget or accidentally miss a dose?  Never  Do you use a pillbox? Yes  Refills requested from providers include:levothyroxine  Confirmed delivery date of 03/07/22, advised patient that pharmacy will contact them the morning of delivery.  Recent blood pressure readings are as follows:none available   Recent blood glucose readings are as follows: Fasting: 112,109,93    Annual wellness visit in last year? Yes Most Recent BP reading:110/70  02/17/22  If Diabetic: Most recent A1C reading:7.3 Last eye exam / retinopathy screening:UTD Last diabetic foot exam:UTD  Cycle dispensing form sent to Margaretmary Dys, PTM for review. [name], CPP notified  Avel Sensor, [place]  [phone]

## 2022-03-04 MED ORDER — LEVOTHYROXINE SODIUM 100 MCG PO TABS: 100.0000 ug | ORAL_TABLET | Freq: Every day | ORAL | 0 refills | Status: DC

## 2022-03-04 NOTE — Addendum Note (Signed)
Addended by: Charlton Haws on: 03/04/2022 04:26 PM   Modules accepted: Orders

## 2022-03-24 ENCOUNTER — Other Ambulatory Visit: Payer: Self-pay | Admitting: Diagnostic Neuroimaging

## 2022-03-26 ENCOUNTER — Other Ambulatory Visit: Payer: Self-pay

## 2022-03-26 DIAGNOSIS — G8929 Other chronic pain: Secondary | ICD-10-CM

## 2022-03-26 MED ORDER — IBUPROFEN 800 MG PO TABS: 800.0000 mg | ORAL_TABLET | Freq: Three times a day (TID) | ORAL | 1 refills | Status: DC

## 2022-03-26 NOTE — Telephone Encounter (Signed)
Last refill: 01/31/22 #30 w/1 Last OV: 02/17/22 CPE Next OV: 06/09/22 TOC to [facility]

## 2022-03-27 ENCOUNTER — Telehealth: Payer: Self-pay

## 2022-03-27 NOTE — Chronic Care Management (AMB) (Signed)
Chronic Care Management Pharmacy Assistant   Name: Valerie Lamb  MRN: [id number] DOB: April 18, 1956  Reason for Encounter: Medication Adherence and Delivery Coordination    Recent office visits:  None since last CCM contact  Recent consult visits:  None since last CCM contact  Hospital visits:  None in previous 6 months  Medications: Outpatient Encounter Medications as of 03/27/2022  Medication Sig   Accu-Chek FastClix Lancets MISC 1 Device by Does not apply route in the morning and at bedtime. Use as directed to check blood glucose twice daily   albuterol (VENTOLIN HFA) 108 (90 Base) MCG/ACT inhaler Inhale 1-2 puffs into the lungs every 6 (six) hours as needed for shortness of breath or wheezing.   amitriptyline (ELAVIL) 25 MG tablet TAKE ONE TABLET BY MOUTH everyday AT bedtime   atenolol (TENORMIN) 25 MG tablet Take 1 tablet (25 mg total) by mouth daily.   Continuous Blood Gluc Receiver ([place]) [place] 1 each by Does not apply route daily.   Continuous Blood Gluc Sensor (DEXCOM G7 SENSOR) MISC Apply sensor every 10 days as directed   Dulaglutide (TRULICITY) 3 WU/9.8JX SOPN Inject 3 mg as directed once a week.   empagliflozin (JARDIANCE) 25 MG TABS tablet Take 1 tablet (25 mg total) by mouth daily before breakfast.   ibuprofen (ADVIL) 800 MG tablet Take 1 tablet (800 mg total) by mouth every 8 (eight) hours.   levothyroxine (SYNTHROID) 100 MCG tablet Take 1 tablet (100 mcg total) by mouth daily before breakfast.   lisinopril (ZESTRIL) 10 MG tablet Take 1 tablet (10 mg total) by mouth daily.   Melatonin 10 MG TABS Take 10 mg by mouth at bedtime.   omeprazole (PRILOSEC) 40 MG capsule Take 1 capsule (40 mg total) by mouth 2 (two) times daily.   pregabalin (LYRICA) 75 MG capsule Take 1 capsule (75 mg total) by mouth 2 (two) times daily.   rosuvastatin (CRESTOR) 20 MG tablet Take 1 tablet (20 mg total) by mouth at bedtime.   Tiotropium Bromide Monohydrate 2.5 MCG/ACT AERS  Inhale 2 puffs into the lungs daily.   varenicline (CHANTIX CONTINUING MONTH PAK) 1 MG tablet Take 1 tablet (1 mg total) by mouth 2 (two) times daily.   No facility-administered encounter medications on file as of 03/27/2022.   BP Readings from Last 3 Encounters:  02/17/22 110/70  12/16/21 116/74  10/17/21 100/62    Lab Results  Component Value Date   HGBA1C 7.3 (A) 02/17/2022      No OVs, Consults, or hospital visits since last care coordination call / Pharmacist visit. No medication changes indicated   Last adherence delivery date:03/07/22      Patient is due for next adherence delivery on: 04/08/22  Spoke with patient on 03/27/22 reviewed medications and coordinated delivery.  This delivery to include: Vials  30 Days No safety caps VIAL medications: Lisinopril '10mg'$ - take 1 tablet at breakfast Omeprazole '40mg'$ - take 1 tablet breakfast, take 1 tablet bedtime  Rosuvastatin '20mg'$  -take 1 tablet bedtime Pregabalin '75mg'$ -take 1 tablet breakfast,take 1 tablet bedtime  Jardiance '25mg'$  -take 1 tablet at breakfast  Amitriptyline '25mg'$ . Take 1 tablet bedtime  Levothyroxine 133mg-take 1 tablet before breakfast Ibuprofen '800mg'$  -take 1 tablet every 8 hours as needed for Trulicity  '3mg'$ - inject once weekly Atenolol '25mg'$ -take 1 tablet daily Amitriptyline '25mg'$  -take 1 tablet bedtime   Patient declined the following medications this month: Dexcom sensors- due soon    Any concerns about your medications? No- patient will  run out of amitriptyline and  atenolol 11/3  uploaded acute fill to sync with delivery .  How often do you forget or accidentally miss a dose? Never  Do you use a pillbox? Yes  Is patient in packaging No   Refills requested from providers include:  Ibuprofen  Confirmed delivery date of 04/08/22, advised patient that pharmacy will contact them the morning of delivery.  Recent blood pressure readings are as follows: none available   Recent blood glucose readings are  as follows: using Dexcom and has had some alarms at night with 54,27,06 The 15-15 rule for low sugars: If sugar reading below 70, have 15 grams of carbohydrate to raise your blood sugar and check it after 15 minutes. If it's still below 70 mg/dL, have another serving. 15 grams of carbs may be: -Glucose tablets (see instructions) -Gel tube (see instructions) -4 ounces (1/2 cup) of juice or regular soda (not diet) -1 tablespoon of sugar, honey, or corn syrup -Hard candies, jellybeans or gumdrops--see food label for how many to consume  Repeat these steps until your blood sugar is at least 70 mg/dL. Once your blood sugar is back to normal, eat a meal or snack to make sure it doesn't lower again.     Am fasting BG's- 90-110  Annual wellness visit in last year? Yes Most Recent BP reading:110/70  If Diabetic: Most recent A1C reading:7.3 Last eye exam / retinopathy screening:UTD Last diabetic foot exam:UTD  Cycle dispensing form sent to [facility], CPP for review.    Avel Sensor, [place]  [phone]

## 2022-04-23 ENCOUNTER — Telehealth: Payer: Self-pay

## 2022-04-23 NOTE — Progress Notes (Signed)
Chronic Care Management Pharmacy Assistant   Name: Valerie Lamb  MRN: [id number] DOB: August 12, 1955  Reason for Encounter: CCM (Appointment Reminder)  Recent office visits:  None since last CCM contact  Recent consult visits:  None since last CCM contact  Hospital visits:  None in previous 6 months  Medications: Outpatient Encounter Medications as of 04/23/2022  Medication Sig   Accu-Chek FastClix Lancets MISC 1 Device by Does not apply route in the morning and at bedtime. Use as directed to check blood glucose twice daily   albuterol (VENTOLIN HFA) 108 (90 Base) MCG/ACT inhaler Inhale 1-2 puffs into the lungs every 6 (six) hours as needed for shortness of breath or wheezing.   amitriptyline (ELAVIL) 25 MG tablet TAKE ONE TABLET BY MOUTH everyday AT bedtime   atenolol (TENORMIN) 25 MG tablet Take 1 tablet (25 mg total) by mouth daily.   Continuous Blood Gluc Receiver ([place]) [place] 1 each by Does not apply route daily.   Continuous Blood Gluc Sensor (DEXCOM G7 SENSOR) MISC Apply sensor every 10 days as directed   Dulaglutide (TRULICITY) 3 MC/9.4BS SOPN Inject 3 mg as directed once a week.   empagliflozin (JARDIANCE) 25 MG TABS tablet Take 1 tablet (25 mg total) by mouth daily before breakfast.   ibuprofen (ADVIL) 800 MG tablet Take 1 tablet (800 mg total) by mouth every 8 (eight) hours.   levothyroxine (SYNTHROID) 100 MCG tablet Take 1 tablet (100 mcg total) by mouth daily before breakfast.   lisinopril (ZESTRIL) 10 MG tablet Take 1 tablet (10 mg total) by mouth daily.   Melatonin 10 MG TABS Take 10 mg by mouth at bedtime.   omeprazole (PRILOSEC) 40 MG capsule Take 1 capsule (40 mg total) by mouth 2 (two) times daily.   pregabalin (LYRICA) 75 MG capsule Take 1 capsule (75 mg total) by mouth 2 (two) times daily.   rosuvastatin (CRESTOR) 20 MG tablet Take 1 tablet (20 mg total) by mouth at bedtime.   Tiotropium Bromide Monohydrate 2.5 MCG/ACT AERS Inhale 2 puffs into the  lungs daily.   varenicline (CHANTIX CONTINUING MONTH PAK) 1 MG tablet Take 1 tablet (1 mg total) by mouth 2 (two) times daily.   No facility-administered encounter medications on file as of 04/23/2022.   BP Readings from Last 3 Encounters:  02/17/22 110/70  12/16/21 116/74  10/17/21 100/62    Pulse Readings from Last 3 Encounters:  02/17/22 64  12/16/21 67  10/17/21 66    Lab Results  Component Value Date/Time   HGBA1C 7.3 (A) 02/17/2022 09:32 AM   HGBA1C 7.1 (A) 11/14/2021 11:38 AM   HGBA1C 10.2 (H) 07/12/2020 12:40 PM   Lab Results  Component Value Date   CREATININE 0.91 02/17/2022   BUN 17 02/17/2022   GFR 66.45 02/17/2022   GFRNONAA >60 02/27/2021   NA 140 02/17/2022   K 4.1 02/17/2022   CALCIUM 9.4 02/17/2022   CO2 26 02/17/2022   Last adherence delivery date: 04/08/2022      Patient is due for next adherence delivery on: 05/07/2022  Spoke with patient on 04/23/2022 reviewed medications and coordinated delivery.  This delivery to include: Vials  30 Days No safety caps VIAL medications: Jardiance '25mg'$  -take 1 tablet at breakfast  Lisinopril '10mg'$ - take 1 tablet at breakfast Omeprazole '40mg'$ - take 1 tablet breakfast, take 1 tablet bedtime  Pregabalin '75mg'$ -take 1 tablet breakfast,take 1 tablet bedtime  Rosuvastatin '20mg'$  -take 1 tablet bedtime Amitriptyline '25mg'$ . Take 1 tablet bedtime  Levothyroxine 170mg-take 1 tablet before breakfast Ibuprofen '800mg'$  -take 1 tablet every 8 hours as needed for Trulicity  '3mg'$ - inject once weekly Atenolol '25mg'$ -take 1 tablet daily Dexcom sensors  Patient declined the following medications this month: None declined  How often do you forget or accidentally miss a dose? Never   Do you use a pillbox? Yes   Is patient in packaging No  Refills requested from providers include: Not complete Ibuprofen '800mg'$  -take 1 tablet every 8 hours as needed  Confirmed delivery date of 05/07/2022, advised patient that pharmacy will contact  them the morning of delivery.   Recent blood pressure readings are as follows: Patient does not take readings at home  Recent blood glucose readings are as follows: Patient states her Dexcom has been alarming at night 4-5 nights a week; During the day the are good ranging between 91-120. Patient does not know how to look at previous readings.   Annual wellness visit in last year? Yes 12/13/2021 Most Recent BP reading: 110/70 on 02/17/2022  If Diabetic: Most recent A1C reading: 7.3 on 02/17/2022 Last eye exam / retinopathy screening: Up to date Last diabetic foot exam: 10/24/2020  Cycle dispensing [facility] CPP for review. Next CCM appointment: 04/28/2022  LCharlene Brooke CPP notified  AMarijean Niemann RUtahClinical Pharmacy Assistant [phone]

## 2022-05-01 ENCOUNTER — Other Ambulatory Visit: Payer: Self-pay

## 2022-05-01 DIAGNOSIS — E1169 Type 2 diabetes mellitus with other specified complication: Secondary | ICD-10-CM

## 2022-05-01 DIAGNOSIS — G629 Polyneuropathy, unspecified: Secondary | ICD-10-CM

## 2022-05-01 NOTE — Telephone Encounter (Signed)
Refill request received. Patient has TOC with Matt. Ok to refill as pended

## 2022-05-02 MED ORDER — ROSUVASTATIN CALCIUM 20 MG PO TABS: 20.0000 mg | ORAL_TABLET | Freq: Every day | ORAL | 0 refills | Status: DC

## 2022-05-02 MED ORDER — PREGABALIN 75 MG PO CAPS: 75.0000 mg | ORAL_CAPSULE | Freq: Two times a day (BID) | ORAL | 0 refills | Status: DC

## 2022-05-05 ENCOUNTER — Other Ambulatory Visit: Payer: Self-pay | Admitting: Family

## 2022-05-05 DIAGNOSIS — G8929 Other chronic pain: Secondary | ICD-10-CM

## 2022-05-21 ENCOUNTER — Telehealth: Payer: Self-pay

## 2022-05-21 NOTE — Chronic Care Management (AMB) (Signed)
Patient called me requesting a refill on her Trulicity '3mg'$ . She declined earlier stating she had supply on hand. She thought about it and called stating she is concerned about traveling over the holidays and forgetting to bring home so she requests another refill of trulicity. Request submitted to [facility] for delivery .  [name], CPP notified  Avel Sensor, [place]  [phone]

## 2022-05-22 ENCOUNTER — Telehealth: Payer: Self-pay

## 2022-05-22 ENCOUNTER — Other Ambulatory Visit: Payer: Self-pay | Admitting: Nurse Practitioner

## 2022-05-22 DIAGNOSIS — G8929 Other chronic pain: Secondary | ICD-10-CM

## 2022-05-22 MED ORDER — IBUPROFEN 800 MG PO TABS: 800.0000 mg | ORAL_TABLET | Freq: Three times a day (TID) | ORAL | 0 refills | Status: DC

## 2022-05-22 NOTE — Telephone Encounter (Signed)
Medication refilled

## 2022-05-22 NOTE — Progress Notes (Addendum)
Chronic Care Management Pharmacy Assistant   Name: Valerie Lamb  MRN: [id number] DOB: September 05, 1955  Reason for Encounter: CCM (Medication Adherence and Delivery Coordination)  Recent office visits:  None since last CCM contact  Recent consult visits:  None since last CCM contact  Hospital visits:  None in previous 6 months  Medications: Outpatient Encounter Medications as of 05/22/2022  Medication Sig   Accu-Chek FastClix Lancets MISC 1 Device by Does not apply route in the morning and at bedtime. Use as directed to check blood glucose twice daily   albuterol (VENTOLIN HFA) 108 (90 Base) MCG/ACT inhaler Inhale 1-2 puffs into the lungs every 6 (six) hours as needed for shortness of breath or wheezing.   amitriptyline (ELAVIL) 25 MG tablet TAKE ONE TABLET BY MOUTH everyday AT bedtime   atenolol (TENORMIN) 25 MG tablet Take 1 tablet (25 mg total) by mouth daily.   Continuous Blood Gluc Receiver ([place]) [place] 1 each by Does not apply route daily.   Continuous Blood Gluc Sensor (DEXCOM G7 SENSOR) MISC Apply sensor every 10 days as directed   Dulaglutide (TRULICITY) 3 GN/5.6OZ SOPN Inject 3 mg as directed once a week.   empagliflozin (JARDIANCE) 25 MG TABS tablet Take 1 tablet (25 mg total) by mouth daily before breakfast.   ibuprofen (ADVIL) 800 MG tablet Take 1 tablet (800 mg total) by mouth every 8 (eight) hours.   levothyroxine (SYNTHROID) 100 MCG tablet Take 1 tablet (100 mcg total) by mouth daily before breakfast.   lisinopril (ZESTRIL) 10 MG tablet Take 1 tablet (10 mg total) by mouth daily.   Melatonin 10 MG TABS Take 10 mg by mouth at bedtime.   omeprazole (PRILOSEC) 40 MG capsule Take 1 capsule (40 mg total) by mouth 2 (two) times daily.   pregabalin (LYRICA) 75 MG capsule Take 1 capsule (75 mg total) by mouth 2 (two) times daily.   rosuvastatin (CRESTOR) 20 MG tablet Take 1 tablet (20 mg total) by mouth at bedtime.   Tiotropium Bromide Monohydrate 2.5 MCG/ACT  AERS Inhale 2 puffs into the lungs daily.   varenicline (CHANTIX CONTINUING MONTH PAK) 1 MG tablet Take 1 tablet (1 mg total) by mouth 2 (two) times daily.   No facility-administered encounter medications on file as of 05/22/2022.   BP Readings from Last 3 Encounters:  02/17/22 110/70  12/16/21 116/74  10/17/21 100/62    Pulse Readings from Last 3 Encounters:  02/17/22 64  12/16/21 67  10/17/21 66    Lab Results  Component Value Date/Time   HGBA1C 7.3 (A) 02/17/2022 09:32 AM   HGBA1C 7.1 (A) 11/14/2021 11:38 AM   HGBA1C 10.2 (H) 07/12/2020 12:40 PM   Lab Results  Component Value Date   CREATININE 0.91 02/17/2022   BUN 17 02/17/2022   GFR 66.45 02/17/2022   GFRNONAA >60 02/27/2021   NA 140 02/17/2022   K 4.1 02/17/2022   CALCIUM 9.4 02/17/2022   CO2 26 02/17/2022    Patient is due for next adherence delivery on: 06/05/2021  Spoke with patient on 05/22/2022 reviewed medications and coordinated delivery.  This delivery to include: Vials  30 Days No safety caps VIAL medications: Jardiance '25mg'$  -take 1 tablet at breakfast  Lisinopril '10mg'$ - take 1 tablet at breakfast Omeprazole '40mg'$ - take 1 tablet breakfast, take 1 tablet bedtime  Pregabalin '75mg'$ -take 1 tablet breakfast,take 1 tablet bedtime  Rosuvastatin '20mg'$  -take 1 tablet bedtime Amitriptyline '25mg'$ . Take 1 tablet bedtime  Levothyroxine 178mg-take 1 tablet before breakfast  Ibuprofen '800mg'$  -take 1 tablet every 8 hours as needed for Trulicity  '3mg'$ - inject once weekly Atenolol '25mg'$ -take 1 tablet daily  Vials: Dexcom sensors   Patient declined the following medications this month: None  Any concerns about your medications? No  How often do you forget or accidentally miss a dose? Never  Do you use a pillbox? Yes  Is patient in packaging Yes  If yes  What is the date on your next pill pack? 05/22/2022  Any concerns or issues with your packaging? No  Refills requested from providers include: Not  complete Ibuprofen '800mg'$  -take 1 tablet every 8 hours as needed for  Confirmed delivery date of 06/05/2021, advised patient that pharmacy will contact them the morning of delivery.   Recent blood pressure readings are as follows: Patient does not take her blood pressure at home.  Recent blood glucose readings are as follows: Patient stated she did not have any readings at this time.   Annual wellness visit in last year? Yes 12/13/2021 Most Recent BP reading: 110/70 on 02/17/2022  If Diabetic: Most recent A1C reading: 7.3 on 02/17/2022 Last eye exam / retinopathy screening:Up to date Last diabetic foot exam: Overdue  Cycle dispensing form sent to [facility], CPP for review.  Next CCM appointment: Non-Scheduled  [name], CPP notified  Marijean Niemann, [place] Clinical Pharmacy Assistant [phone]

## 2022-05-22 NOTE — Telephone Encounter (Addendum)
Patient requests refill of ibuprofen 800 mg (previously prescribed by Dr Einar Pheasant).  Last eRx 03/26/22. #30 with 1 RF. Past has TOC appt with Romilda Garret 06/09/22.  [facility] - [place], [place] - [address] [place] [address] Phone: [phone] Fax: [phone]   Routing to refill pool.

## 2022-05-25 ENCOUNTER — Other Ambulatory Visit: Payer: Self-pay | Admitting: Diagnostic Neuroimaging

## 2022-05-27 ENCOUNTER — Telehealth: Payer: Self-pay

## 2022-05-27 NOTE — Telephone Encounter (Signed)
Pt needs Follow-Up Appointment, please call Pt.

## 2022-05-27 NOTE — Telephone Encounter (Signed)
Phone rep called pt to schedule the Follow-Up Appointment , based on DPR phone rep could not leave a vm.

## 2022-05-30 ENCOUNTER — Other Ambulatory Visit: Payer: Self-pay | Admitting: Nurse Practitioner

## 2022-05-30 DIAGNOSIS — G8929 Other chronic pain: Secondary | ICD-10-CM

## 2022-06-09 ENCOUNTER — Ambulatory Visit (INDEPENDENT_AMBULATORY_CARE_PROVIDER_SITE_OTHER): Payer: 59 | Admitting: Nurse Practitioner

## 2022-06-09 ENCOUNTER — Encounter: Payer: Self-pay | Admitting: Nurse Practitioner

## 2022-06-09 ENCOUNTER — Telehealth: Payer: Self-pay | Admitting: Nurse Practitioner

## 2022-06-09 ENCOUNTER — Other Ambulatory Visit: Payer: Self-pay

## 2022-06-09 VITALS — BP 122/78 | HR 55 | Ht 67.0 in | Wt 239.0 lb

## 2022-06-09 DIAGNOSIS — L603 Nail dystrophy: Secondary | ICD-10-CM

## 2022-06-09 DIAGNOSIS — R7989 Other specified abnormal findings of blood chemistry: Secondary | ICD-10-CM

## 2022-06-09 DIAGNOSIS — E1169 Type 2 diabetes mellitus with other specified complication: Secondary | ICD-10-CM | POA: Diagnosis not present

## 2022-06-09 DIAGNOSIS — E1165 Type 2 diabetes mellitus with hyperglycemia: Secondary | ICD-10-CM | POA: Diagnosis not present

## 2022-06-09 DIAGNOSIS — Z720 Tobacco use: Secondary | ICD-10-CM | POA: Diagnosis not present

## 2022-06-09 DIAGNOSIS — G629 Polyneuropathy, unspecified: Secondary | ICD-10-CM

## 2022-06-09 DIAGNOSIS — Z794 Long term (current) use of insulin: Secondary | ICD-10-CM

## 2022-06-09 DIAGNOSIS — E785 Hyperlipidemia, unspecified: Secondary | ICD-10-CM

## 2022-06-09 DIAGNOSIS — E1159 Type 2 diabetes mellitus with other circulatory complications: Secondary | ICD-10-CM

## 2022-06-09 DIAGNOSIS — E1122 Type 2 diabetes mellitus with diabetic chronic kidney disease: Secondary | ICD-10-CM

## 2022-06-09 DIAGNOSIS — E039 Hypothyroidism, unspecified: Secondary | ICD-10-CM

## 2022-06-09 DIAGNOSIS — K219 Gastro-esophageal reflux disease without esophagitis: Secondary | ICD-10-CM | POA: Diagnosis not present

## 2022-06-09 DIAGNOSIS — J438 Other emphysema: Secondary | ICD-10-CM | POA: Diagnosis not present

## 2022-06-09 DIAGNOSIS — I152 Hypertension secondary to endocrine disorders: Secondary | ICD-10-CM

## 2022-06-09 DIAGNOSIS — R251 Tremor, unspecified: Secondary | ICD-10-CM

## 2022-06-09 LAB — CBC
HCT: 49.9 % — ABNORMAL HIGH (ref 36.0–46.0)
Hemoglobin: 16.6 g/dL — ABNORMAL HIGH (ref 12.0–15.0)
MCHC: 33.3 g/dL (ref 30.0–36.0)
MCV: 89.6 fl (ref 78.0–100.0)
Platelets: 150 10*3/uL (ref 150.0–400.0)
RBC: 5.57 Mil/uL — ABNORMAL HIGH (ref 3.87–5.11)
RDW: 14.9 % (ref 11.5–15.5)
WBC: 5.5 10*3/uL (ref 4.0–10.5)

## 2022-06-09 LAB — IBC + FERRITIN
Ferritin: 38.3 ng/mL (ref 10.0–291.0)
Iron: 93 ug/dL (ref 42–145)
Saturation Ratios: 27.6 % (ref 20.0–50.0)
TIBC: 337.4 ug/dL (ref 250.0–450.0)
Transferrin: 241 mg/dL (ref 212.0–360.0)

## 2022-06-09 LAB — POCT GLYCOSYLATED HEMOGLOBIN (HGB A1C): Hemoglobin A1C: 8 % — AB (ref 4.0–5.6)

## 2022-06-09 LAB — VITAMIN B12: Vitamin B-12: 316 pg/mL (ref 211–911)

## 2022-06-09 LAB — TSH: TSH: 0.46 u[IU]/mL (ref 0.35–5.50)

## 2022-06-09 LAB — FOLATE: Folate: 12.7 ng/mL (ref >5.9)

## 2022-06-09 MED ORDER — DEXCOM G7 RECEIVER DEVI: 1.0000 | Freq: Every day | 2 refills | Status: DC

## 2022-06-09 MED ORDER — RYBELSUS 3 MG PO TABS: 3.0000 mg | ORAL_TABLET | Freq: Every day | ORAL | 0 refills | Status: DC

## 2022-06-09 NOTE — Assessment & Plan Note (Signed)
Patient currently maintained on omeprazole 40 mg.  Tolerates medication well.  Continue

## 2022-06-09 NOTE — Assessment & Plan Note (Signed)
She has been working on reducing the amount of cigarettes she is smoking continued.  Has tried Chantix in the past

## 2022-06-09 NOTE — Assessment & Plan Note (Signed)
Patient currently maintained on lisinopril and metoprolol.  Taking medication as prescribed tolerating that well.  Blood pressure within normal limits.  Continue medications as prescribed

## 2022-06-09 NOTE — Assessment & Plan Note (Signed)
Was seen and evaluated by neurology patient is on amitriptyline and pregabalin.  Continue medication as prescribed

## 2022-06-09 NOTE — Assessment & Plan Note (Signed)
Likely multifactorial.  Pending TSH along with CBC and iron studies.

## 2022-06-09 NOTE — Progress Notes (Signed)
Established Patient Office Visit  Subjective   Patient ID: Valerie Lamb, female    DOB: Mar 27, 1956  Age: 67 y.o. MRN: [id number]  Chief Complaint  Patient presents with   Diabetes    Diabetes Pertinent negatives for hypoglycemia include no headaches. Pertinent negatives for diabetes include no chest pain.    Transfer of care: last seen by Waunita Schooner, MD 02/17/2022 Last CPE was 02/17/2022 8.0 DM2: Uses a Dexcom  states that she has had lows at 71. She states the turlicity is burning when se injections Diet: States that she does 2 meals a day. States that she lives with son, DIL, and grand daughter   COPD: States that is on sprivia and use albuterol once or twice every other week   HTN: States that she had a machine that does not work   GERD: Animator works well. States that she did change her   Depression  Neuropathy: has been seen by neurology and on lyrica and amitriptylene. States that she was suppose to have an MRI of the brain but did not get a call of it    Review of Systems  Constitutional:  Negative for chills and fever.  Respiratory:  Positive for shortness of breath (baseline with exertion).   Cardiovascular:  Negative for chest pain.  Gastrointestinal:  Negative for abdominal pain, blood in stool, constipation, diarrhea, nausea and vomiting.       Bm every other day  Genitourinary:  Negative for dysuria and hematuria.  Neurological:  Positive for tingling. Negative for headaches.  Psychiatric/Behavioral:  Negative for hallucinations and suicidal ideas.       Objective:     BP 122/78   Pulse (!) 55   Ht '5\' 7"'$  (1.702 m)   Wt 239 lb (108.4 kg)   SpO2 93%   BMI 37.43 kg/m  BP Readings from Last 3 Encounters:  06/09/22 122/78  02/17/22 110/70  12/16/21 116/74   Wt Readings from Last 3 Encounters:  06/09/22 239 lb (108.4 kg)  02/17/22 233 lb 2 oz (105.7 kg)  12/16/21 236 lb (107 kg)      Physical Exam Vitals and nursing note reviewed.   Constitutional:      Appearance: Normal appearance. She is obese.  HENT:     Right Ear: Tympanic membrane, ear canal and external ear normal.     Left Ear: Tympanic membrane, ear canal and external ear normal.     Mouth/Throat:     Mouth: Mucous membranes are moist.     Pharynx: Oropharynx is clear.  Eyes:     Extraocular Movements: Extraocular movements intact.     Pupils: Pupils are equal, round, and reactive to light.  Cardiovascular:     Rate and Rhythm: Normal rate and regular rhythm.     Pulses:          Dorsalis pedis pulses are 2+ on the right side and 2+ on the left side.       Posterior tibial pulses are 2+ on the right side and 2+ on the left side.     Heart sounds: Normal heart sounds.  Pulmonary:     Effort: Pulmonary effort is normal.     Breath sounds: Normal breath sounds.  Abdominal:     General: Bowel sounds are normal.  Musculoskeletal:     Right lower leg: No edema.     Left lower leg: No edema.  Feet:     Right foot:     Skin integrity: Skin  integrity normal.     Left foot:     Skin integrity: Skin integrity normal.  Lymphadenopathy:     Cervical: No cervical adenopathy.  Skin:    General: Skin is warm.  Neurological:     General: No focal deficit present.     Mental Status: She is alert.     Deep Tendon Reflexes:     Reflex Scores:      Bicep reflexes are 2+ on the right side and 2+ on the left side.      Patellar reflexes are 2+ on the right side and 2+ on the left side.    Comments: Bilateral upper and lower extremity strength 5/5      Results for orders placed or performed in visit on 06/09/22  HgB A1c  Result Value Ref Range   Hemoglobin A1C 8.0 (A) 4.0 - 5.6 %   HbA1c POC (<> result, manual entry)     HbA1c, POC (prediabetic range)     HbA1c, POC (controlled diabetic range)        The ASCVD Risk score (Arnett DK, et al., 2019) failed to calculate for the following reasons:   Unable to determine if patient is Non-Hispanic African  American    Assessment & Plan:   Problem List Items Addressed This Visit       Cardiovascular and Mediastinum   Hypertension associated with diabetes ([place])    Patient currently maintained on lisinopril and metoprolol.  Taking medication as prescribed tolerating that well.  Blood pressure within normal limits.  Continue medications as prescribed      Relevant Medications   Semaglutide (RYBELSUS) 3 MG TABS     Respiratory   COPD (chronic obstructive pulmonary disease) (HCC)    Patient currently maintained on Tia Tropium and albuterol as needed.  Patient uses albuterol infrequently.  She is working on cutting back on smoking used to smoke 2 packs a day down to half a pack a day.  Continue albuterol and to Tropium as prescribed.  Continue working on smoking cessation      Relevant Orders   IBC + Ferritin     Digestive   GERD (gastroesophageal reflux disease)    Patient currently maintained on omeprazole 40 mg.  Tolerates medication well.  Continue        Endocrine   Hypothyroidism   Relevant Orders   TSH   Hyperlipidemia associated with type 2 diabetes mellitus ([place])    Patient currently maintained on rosuvastatin.      Relevant Medications   Semaglutide (RYBELSUS) 3 MG TABS   Type 2 diabetes mellitus with hyperglycemia, with long-term current use of insulin ([place]) - Primary    Maintained on Jardiance and Trulicity.  Patient does not tolerate Trulicity well she gets burning sensations every time she injects.  She would like to come off.  She would like to not use an injectable version we will switch her from Trulicity 3 mg to Rybelsus.  Did discuss how to take this medication.  Will start her on 3 mg 1 week after she completed her last dose of Trulicity titrate her up after a month to 7 mg.  And see her back in office likely go up to 14 mg as her diabetes is uncontrolled currently      Relevant Medications   Semaglutide (RYBELSUS) 3 MG TABS   Other Relevant Orders   HgB A1c  (Completed)   CBC     Nervous and Auditory   Neuropathy  Was seen and evaluated by neurology patient is on amitriptyline and pregabalin.  Continue medication as prescribed        Musculoskeletal and Integument   Brittle nails    Likely multifactorial.  Pending TSH along with CBC and iron studies.      Relevant Orders   IBC + Ferritin   Vitamin B12   Folate   TSH     Other   Tobacco abuse    She has been working on reducing the amount of cigarettes she is smoking continued.  Has tried Chantix in the past      Abnormal CBC    Pending repeat CBC iron, ferritin, B12, and folate studies.      Relevant Orders   IBC + Ferritin   Vitamin B12   Folate    Return in about 3 months (around 09/08/2022) for DM recheck .    Romilda Garret, NP

## 2022-06-09 NOTE — Assessment & Plan Note (Signed)
Patient currently maintained on Tia Tropium and albuterol as needed.  Patient uses albuterol infrequently.  She is working on cutting back on smoking used to smoke 2 packs a day down to half a pack a day.  Continue albuterol and to Tropium as prescribed.  Continue working on smoking cessation

## 2022-06-09 NOTE — Telephone Encounter (Signed)
Patient called in to follow up on the referral for a MRI. She stated that she will need a location with an Open MRI or she will need to be sleep. Thank you!

## 2022-06-09 NOTE — Patient Instructions (Signed)
Nice to see you today We will discontinue the trulicity and start the Rybellsus Follow up in 3 months, sooner if you need me

## 2022-06-09 NOTE — Assessment & Plan Note (Signed)
Maintained on Jardiance and Trulicity.  Patient does not tolerate Trulicity well she gets burning sensations every time she injects.  She would like to come off.  She would like to not use an injectable version we will switch her from Trulicity 3 mg to Rybelsus.  Did discuss how to take this medication.  Will start her on 3 mg 1 week after she completed her last dose of Trulicity titrate her up after a month to 7 mg.  And see her back in office likely go up to 14 mg as her diabetes is uncontrolled currently

## 2022-06-09 NOTE — Assessment & Plan Note (Signed)
Pending repeat CBC iron, ferritin, B12, and folate studies.

## 2022-06-09 NOTE — Assessment & Plan Note (Signed)
Patient currently maintained on rosuvastatin.

## 2022-06-10 NOTE — Addendum Note (Signed)
Addended by: Michela Pitcher on: 06/10/2022 12:39 PM   Modules accepted: Orders

## 2022-06-10 NOTE — Telephone Encounter (Signed)
Called patient and advised MRI was ordered by Dr. Leta Baptist and she would need to contact his office to have scheduled as open. She states she will not be going back to Dr. Leta Baptist for care, she is not satisfied with their office. She is requesting referral to new neurologist and order for MRI.   Please advise, thank you!

## 2022-06-10 NOTE — Telephone Encounter (Signed)
I will place a new referral for Neuro and they can handle the MRI

## 2022-06-12 ENCOUNTER — Encounter: Payer: Self-pay | Admitting: Neurology

## 2022-06-24 ENCOUNTER — Other Ambulatory Visit: Payer: Self-pay | Admitting: Diagnostic Neuroimaging

## 2022-06-25 ENCOUNTER — Other Ambulatory Visit: Payer: Self-pay

## 2022-06-25 DIAGNOSIS — R251 Tremor, unspecified: Secondary | ICD-10-CM

## 2022-06-25 NOTE — Telephone Encounter (Signed)
Last visit 06/09/2022 Next 10/03/2022

## 2022-06-26 ENCOUNTER — Telehealth: Payer: Self-pay

## 2022-06-26 DIAGNOSIS — E1165 Type 2 diabetes mellitus with hyperglycemia: Secondary | ICD-10-CM

## 2022-06-26 MED ORDER — ATENOLOL 25 MG PO TABS: 25.0000 mg | ORAL_TABLET | Freq: Every day | ORAL | 1 refills | Status: DC

## 2022-06-26 MED ORDER — RYBELSUS 7 MG PO TABS: 7.0000 mg | ORAL_TABLET | Freq: Every day | ORAL | 1 refills | Status: DC

## 2022-06-26 MED ORDER — LEVOTHYROXINE SODIUM 100 MCG PO TABS: 100.0000 ug | ORAL_TABLET | Freq: Every day | ORAL | 1 refills | Status: DC

## 2022-06-26 MED ORDER — OMEPRAZOLE 40 MG PO CPDR: 40.0000 mg | DELAYED_RELEASE_CAPSULE | Freq: Two times a day (BID) | ORAL | 1 refills | Status: DC

## 2022-06-26 NOTE — Telephone Encounter (Signed)
Updated RX sent in to pharmacy

## 2022-06-26 NOTE — Addendum Note (Signed)
Addended by: Michela Pitcher on: 06/26/2022 01:16 PM   Modules accepted: Orders

## 2022-06-26 NOTE — Telephone Encounter (Signed)
Pt will complete first month of Rybelsus 3 mg soon. [facility] is planning to deliver her medications on 2/5, they will need Rybelsus 7 mg Rx for that delivery.  Routing to PCP for Rx.

## 2022-06-26 NOTE — Progress Notes (Signed)
Care Management & Coordination Services Pharmacy Team  Reason for Encounter: Medication coordination and delivery  Contacted patient to discuss medications and coordinate delivery from [facility]. Spoke with patient on 06/26/2022  Cycle dispensing form sent to [facility],   for review.   Last adherence delivery date:06/05/22      Patient is due for next adherence delivery on: 07/07/22  Spoke with patient on 06/25/22 reviewed medications and coordinated delivery.  This delivery to include: Vials  30 Days  Jardiance '25mg'$  -take 1 tablet at breakfast  Lisinopril '10mg'$ - take 1 tablet at breakfast Omeprazole '40mg'$ - take 1 tablet breakfast, take 1 tablet bedtime  Pregabalin '75mg'$ -take 1 tablet breakfast,take 1 tablet bedtime  Rosuvastatin '20mg'$  -take 1 tablet bedtime Amitriptyline '25mg'$ . Take 1 tablet bedtime  Levothyroxine 168mg-take 1 tablet before breakfast Atenolol '25mg'$ -take 1 tablet daily Rybelsus  '3mg'$ - take 1 tablet daily for 1 month then titrate up to '7mg'$ .  Dexcom G7 receiver  Patient declined the following medications this month: Trulicity- continued burning reaction   Refills requested from providers include: atenolol,levothyroxine,omeprazole(PCP) Amitriptyline (specialist)  Confirmed delivery date of 07/07/22, advised patient that pharmacy will contact them the morning of delivery.   Any concerns about your medications? Yes- patient states she is not going to use trulicity any longer, PCP changed her to Jardiance pill, due to trulicity injection has continuously burned when used.   How often do you forget or accidentally miss a dose? Never  Do you use a pillbox? Yes  Is patient in packaging No    Recent blood pressure readings are as follows:none available   Recent blood glucose readings are as follows:none available    Chart review: Recent office visits:  06/09/2022-James M Cable NP(PCP)-f/u DM,TOC, LNWGN,(F6O8.0) stop Trulicity,start Rybelsus '3mg'$ ,take 1  tablet daily for 1 month then titrate up to '7mg'$  once daily, new dexcom G7 ordered, f/u 3 months  Recent consult visits:  None since last CCM contact   Hospital visits:  None in previous 6 months  Medications: Outpatient Encounter Medications as of 06/26/2022  Medication Sig   albuterol (VENTOLIN HFA) 108 (90 Base) MCG/ACT inhaler Inhale 1-2 puffs into the lungs every 6 (six) hours as needed for shortness of breath or wheezing.   amitriptyline (ELAVIL) 25 MG tablet TAKE ONE TABLET BY MOUTH EVERYDAY AT BEDTIME Please follow up with PCP for future refills   atenolol (TENORMIN) 25 MG tablet Take 1 tablet (25 mg total) by mouth daily.   Continuous Blood Gluc Receiver ([place] each by Does not apply route daily. DX E11.22   Continuous Blood Gluc Sensor (DEXCOM G7 SENSOR) MISC Apply sensor every 10 days as directed   empagliflozin (JARDIANCE) 25 MG TABS tablet Take 1 tablet (25 mg total) by mouth daily before breakfast.   ibuprofen (ADVIL) 800 MG tablet Take 1 tablet (800 mg total) by mouth every 8 (eight) hours.   levothyroxine (SYNTHROID) 100 MCG tablet Take 1 tablet (100 mcg total) by mouth daily before breakfast.   lisinopril (ZESTRIL) 10 MG tablet Take 1 tablet (10 mg total) by mouth daily.   Melatonin 10 MG TABS Take 10 mg by mouth at bedtime.   omeprazole (PRILOSEC) 40 MG capsule Take 1 capsule (40 mg total) by mouth 2 (two) times daily.   pregabalin (LYRICA) 75 MG capsule Take 1 capsule (75 mg total) by mouth 2 (two) times daily.   rosuvastatin (CRESTOR) 20 MG tablet Take 1 tablet (20 mg total) by mouth at bedtime.  Semaglutide (RYBELSUS) 3 MG TABS Take 3 mg by mouth daily.   Tiotropium Bromide Monohydrate 2.5 MCG/ACT AERS Inhale 2 puffs into the lungs daily.   No facility-administered encounter medications on file as of 06/26/2022.   BP Readings from Last 3 Encounters:  06/09/22 122/78  02/17/22 110/70  12/16/21 116/74    Pulse Readings from Last 3 Encounters:   06/09/22 (!) 55  02/17/22 64  12/16/21 67    Lab Results  Component Value Date/Time   HGBA1C 8.0 (A) 06/09/2022 09:48 AM   HGBA1C 7.3 (A) 02/17/2022 09:32 AM   HGBA1C 10.2 (H) 07/12/2020 12:40 PM   Lab Results  Component Value Date   CREATININE 0.91 02/17/2022   BUN 17 02/17/2022   GFR 66.45 02/17/2022   GFRNONAA >60 02/27/2021   NA 140 02/17/2022   K 4.1 02/17/2022   CALCIUM 9.4 02/17/2022   CO2 26 02/17/2022     [name], PharmD notified  Avel Sensor, [place] Assistant [phone]

## 2022-07-18 ENCOUNTER — Ambulatory Visit: Payer: 59 | Admitting: Neurology

## 2022-07-23 ENCOUNTER — Other Ambulatory Visit: Payer: Self-pay | Admitting: Family

## 2022-07-23 ENCOUNTER — Other Ambulatory Visit: Payer: Self-pay | Admitting: Nurse Practitioner

## 2022-07-23 DIAGNOSIS — E1159 Type 2 diabetes mellitus with other circulatory complications: Secondary | ICD-10-CM

## 2022-07-23 DIAGNOSIS — E1169 Type 2 diabetes mellitus with other specified complication: Secondary | ICD-10-CM

## 2022-07-23 DIAGNOSIS — G8929 Other chronic pain: Secondary | ICD-10-CM

## 2022-07-23 DIAGNOSIS — G629 Polyneuropathy, unspecified: Secondary | ICD-10-CM

## 2022-07-23 DIAGNOSIS — E1165 Type 2 diabetes mellitus with hyperglycemia: Secondary | ICD-10-CM

## 2022-07-23 DIAGNOSIS — E785 Hyperlipidemia, unspecified: Secondary | ICD-10-CM

## 2022-07-23 MED ORDER — IBUPROFEN 800 MG PO TABS: 800.0000 mg | ORAL_TABLET | Freq: Three times a day (TID) | ORAL | 0 refills | Status: DC

## 2022-07-24 ENCOUNTER — Other Ambulatory Visit: Payer: Self-pay

## 2022-07-24 ENCOUNTER — Telehealth: Payer: Self-pay

## 2022-07-24 DIAGNOSIS — E1159 Type 2 diabetes mellitus with other circulatory complications: Secondary | ICD-10-CM

## 2022-07-24 NOTE — Progress Notes (Signed)
Care Management & Coordination Services Pharmacy Team  Reason for Encounter: Medication coordination and delivery  Contacted patient to discuss medications and coordinate delivery from [facility]. Spoke with patient on 07/25/2022  Cycle dispensing form sent to [facility]  for review.   Last adherence delivery date:07/07/22      Patient is due for next adherence delivery on: 08/05/22  This delivery to include: Vials  30 Days  Jardiance '25mg'$  -take 1 tablet at breakfast  Lisinopril '10mg'$ - take 1 tablet at breakfast Omeprazole '40mg'$ - take 1 tablet breakfast, take 1 tablet bedtime  Pregabalin '75mg'$ -take 1 tablet breakfast,take 1 tablet bedtime  Rosuvastatin '20mg'$  -take 1 tablet bedtime Amitriptyline '25mg'$ . Take 1 tablet bedtime  Levothyroxine 11mg-take 1 tablet before breakfast Atenolol '25mg'$ -take 1 tablet daily Rybelsus  '7mg'$ - take 1 tablet daily Ibuprofen '800mg'$  -take 1 tablet every 8 hours as needed  Dexcom G7   Patient declined the following medications this month: Trulicity- no longer taking -burning reaction   Refills requested from providers include:lisinopril,pregabalin,rosuvastatin,rybelsus  Confirmed delivery date of 08/05/22, advised patient that pharmacy will contact them the morning of delivery.   Any concerns about your medications? Yes-Patient request refill on Ibuprofen '800mg'$  to take as needed   How often do you forget or accidentally miss a dose? Never  Do you use a pillbox? Yes  Is patient in packaging No   Recent blood pressure readings are as follows:none available   Recent blood glucose readings are as follows:    [facility]  None in previous 6 months  Medications: Outpatient Encounter Medications as of 07/24/2022  Medication Sig   albuterol (VENTOLIN HFA) 108 (90 Base) MCG/ACT inhaler Inhale 1-2 puffs into the lungs every 6 (six) hours as needed for shortness of breath or wheezing.   amitriptyline (ELAVIL)  25 MG tablet TAKE ONE TABLET BY MOUTH EVERYDAY AT BEDTIME Needs appointment for further refills   atenolol (TENORMIN) 25 MG tablet Take 1 tablet (25 mg total) by mouth daily.   Continuous Blood Gluc Receiver ([place]1 each by Does not apply route daily. DX E11.22   Continuous Blood Gluc Sensor (DEXCOM G7 SENSOR) MISC Apply sensor every 10 days as directed   empagliflozin (JARDIANCE) 25 MG TABS tablet Take 1 tablet (25 mg total) by mouth daily before breakfast.   ibuprofen (ADVIL) 800 MG tablet Take 1 tablet (800 mg total) by mouth every 8 (eight) hours.   levothyroxine (SYNTHROID) 100 MCG tablet Take 1 tablet (100 mcg total) by mouth daily before breakfast.   lisinopril (ZESTRIL) 10 MG tablet Take 1 tablet (10 mg total) by mouth daily.   Melatonin 10 MG TABS Take 10 mg by mouth at bedtime.   omeprazole (PRILOSEC) 40 MG capsule Take 1 capsule (40 mg total) by mouth 2 (two) times daily.   pregabalin (LYRICA) 75 MG capsule Take 1 capsule (75 mg total) by mouth 2 (two) times daily.   rosuvastatin (CRESTOR) 20 MG tablet Take 1 tablet (20 mg total) by mouth at bedtime.   Semaglutide (RYBELSUS) 7 MG TABS Take 1 tablet (7 mg total) by mouth daily.   Tiotropium Bromide Monohydrate 2.5 MCG/ACT AERS Inhale 2 puffs into the lungs daily.   No facility-administered encounter medications on file as of 07/24/2022.   BP Readings from Last 3 Encounters:  06/09/22 122/78  02/17/22 110/70  12/16/21 116/74    Pulse Readings from Last 3 Encounters:  06/09/22 (!) 55  02/17/22 64  12/16/21 67    Lab Results  Component Value Date/Time   HGBA1C 8.0 (A) 06/09/2022 09:48 AM   HGBA1C 7.3 (A) 02/17/2022 09:32 AM   HGBA1C 10.2 (H) 07/12/2020 12:40 PM   Lab Results  Component Value Date   CREATININE 0.91 02/17/2022   BUN 17 02/17/2022   GFR 66.45 02/17/2022   GFRNONAA >60 02/27/2021   NA 140 02/17/2022   K 4.1 02/17/2022   CALCIUM 9.4 02/17/2022   CO2 26 02/17/2022     [name], PharmD notified  Avel Sensor, [place] Assistant [phone]

## 2022-07-30 MED ORDER — LISINOPRIL 10 MG PO TABS: 10.0000 mg | ORAL_TABLET | Freq: Every day | ORAL | 1 refills | Status: DC

## 2022-08-12 ENCOUNTER — Telehealth: Payer: Self-pay | Admitting: Pharmacist

## 2022-08-12 NOTE — Telephone Encounter (Signed)
-----   Message from [place], [place] sent at 08/12/2022 10:59 AM EDT ----- Regarding: medication reaction Patient contacted me and stated that she has tried Rybelsus for the last 2 weeks. Complications have been in and out of the bathroom all night. Alarms going off showing BG's from 240-350 during the evening. Dry mouth constantly.The trulicity did burn some going in , however, she would rather have that discomfort for one day vs. Alarms going off all night and dry mouth and multiple bathroom trips and high BG's. Advised the patient I would get message to PharmD.  [name], PharmD notified  Valerie Lamb, [place] Assistant [phone]

## 2022-08-12 NOTE — Telephone Encounter (Signed)
Patient reports she started Rybelsus 3 weeks ago. She did not start this back in January when it was prescribed because she wanted to finish the Trulicity she had on hand. She is currently taking Rybelsus 3 mg weekly. She reports glucose has been high (up to 250-300s) and she is urinating frequently. Discussed urination is likely due to high BG, not a direct side effect of Rybelsus. Discussed the 3 mg dose of Rybelsus is not actually effective for glucose control, and is not equivalent to Trulicity 3 mg that she was taking before, but it is necessary to complete 4 weeks before increasing to 7 mg.  Advised pt to complete 4 weeks of Rybelsus 3 mg then increase to 7 mg (which she has on hand); I anticipate we will need to increase further to 14 mg after 1 month on 7 mg but pt will continue to monitor glucose in the meantime. Advised to reduce carbs/sugars in diet to help with glucose spikes as well.

## 2022-08-21 ENCOUNTER — Other Ambulatory Visit: Payer: Self-pay | Admitting: Nurse Practitioner

## 2022-08-21 DIAGNOSIS — G8929 Other chronic pain: Secondary | ICD-10-CM

## 2022-08-22 ENCOUNTER — Telehealth: Payer: Self-pay

## 2022-08-22 DIAGNOSIS — E1165 Type 2 diabetes mellitus with hyperglycemia: Secondary | ICD-10-CM

## 2022-08-22 NOTE — Telephone Encounter (Addendum)
Patient reports she cannot tolerate Rybelsus due to GI upset, she wants to go back to Trulicity 3 mg weekly.  Rouitng to PCP for rx.  [facility] - [place], [place] - [address] [place] [address] Phone: [phone] Fax: [phone]

## 2022-08-22 NOTE — Addendum Note (Signed)
Addended by: Charlton Haws on: 08/22/2022 04:14 PM   Modules accepted: Orders

## 2022-08-22 NOTE — Progress Notes (Signed)
Care Management & Coordination Services Pharmacy Team  Reason for Encounter: Medication coordination and delivery  Contacted patient to discuss medications and coordinate delivery from [facility]. Spoke with patient on 08/22/2022  Cycle dispensing form sent to [facility]  for review.   Last adherence delivery date:08/05/22      Patient is due for next adherence delivery on: 09/03/22  This delivery to include: Vials  30 Days  Jardiance 25mg  -take 1 tablet at breakfast  Lisinopril 10mg - take 1 tablet at breakfast Omeprazole 40mg - take 1 tablet breakfast, take 1 tablet bedtime  Pregabalin 75mg -take 1 tablet breakfast,take 1 tablet bedtime  Rosuvastatin 20mg  -take 1 tablet bedtime Amitriptyline 25mg . Take 1 tablet bedtime  Levothyroxine 186mcg-take 1 tablet before breakfast Atenolol 25mg -take 1 tablet daily Ibuprofen 800mg  -take 1 tablet every 8 hours as needed Dexcom G7   Patient declined the following medications this month: Rybelsus  7mg - take 1 tablet daily  Refills requested from providers include: rybelsus  Confirmed delivery date of 09/03/22, advised patient that pharmacy will contact them the morning of delivery.   Any concerns about your medications? Yes- GI upset and diarrhea for the past 3 days constant. Patient states it is the rybelsus. Patient states she would rather have trulicity once weekly. Patient states metformin reacted the same with her in the past.  How often do you forget or accidentally miss a dose? Rarely- may miss a dose when dealing with nausea and vomiting  Do you use a pillbox? No  Is patient in packaging No   Recent blood pressure readings are as follows:none available   Recent blood glucose readings are as follows: Patient reports BG's elevated  > 400  Chart review: Recent office visits:  None since last contact   Recent consult visits:  None since last contact  Hospital visits:  None in previous 6  months  Medications: Outpatient Encounter Medications as of 08/22/2022  Medication Sig   albuterol (VENTOLIN HFA) 108 (90 Base) MCG/ACT inhaler Inhale 1-2 puffs into the lungs every 6 (six) hours as needed for shortness of breath or wheezing.   amitriptyline (ELAVIL) 25 MG tablet TAKE ONE TABLET BY MOUTH EVERYDAY AT BEDTIME Needs appointment for further refills   atenolol (TENORMIN) 25 MG tablet Take 1 tablet (25 mg total) by mouth daily.   Continuous Blood Gluc Receiver ([place]) [place] 1 each by Does not apply route daily. DX E11.22   Continuous Blood Gluc Sensor (DEXCOM G7 SENSOR) MISC Apply sensor every 10 days as directed   empagliflozin (JARDIANCE) 25 MG TABS tablet Take 1 tablet (25 mg total) by mouth daily before breakfast.   ibuprofen (ADVIL) 800 MG tablet Take 1 tablet (800 mg total) by mouth every 8 (eight) hours.   levothyroxine (SYNTHROID) 100 MCG tablet Take 1 tablet (100 mcg total) by mouth daily before breakfast.   lisinopril (ZESTRIL) 10 MG tablet Take 1 tablet (10 mg total) by mouth daily.   Melatonin 10 MG TABS Take 10 mg by mouth at bedtime.   omeprazole (PRILOSEC) 40 MG capsule Take 1 capsule (40 mg total) by mouth 2 (two) times daily.   pregabalin (LYRICA) 75 MG capsule TAKE ONE CAPSULE BY MOUTH TWICE DAILY   rosuvastatin (CRESTOR) 20 MG tablet TAKE ONE TABLET BY MOUTH EVERYDAY AT BEDTIME   Semaglutide (RYBELSUS) 7 MG TABS Take 1 tablet (7 mg total) by mouth daily.   Tiotropium Bromide Monohydrate 2.5 MCG/ACT AERS Inhale 2 puffs into the lungs daily.   No facility-administered  encounter medications on file as of 08/22/2022.   BP Readings from Last 3 Encounters:  06/09/22 122/78  02/17/22 110/70  12/16/21 116/74    Pulse Readings from Last 3 Encounters:  06/09/22 (!) 55  02/17/22 64  12/16/21 67    Lab Results  Component Value Date/Time   HGBA1C 8.0 (A) 06/09/2022 09:48 AM   HGBA1C 7.3 (A) 02/17/2022 09:32 AM   HGBA1C 10.2 (H) 07/12/2020 12:40 PM    Lab Results  Component Value Date   CREATININE 0.91 02/17/2022   BUN 17 02/17/2022   GFR 66.45 02/17/2022   GFRNONAA >60 02/27/2021   NA 140 02/17/2022   K 4.1 02/17/2022   CALCIUM 9.4 02/17/2022   CO2 26 02/17/2022     [name], PharmD notified  Avel Sensor, [place] Assistant [phone]

## 2022-08-24 ENCOUNTER — Other Ambulatory Visit: Payer: Self-pay | Admitting: Internal Medicine

## 2022-08-24 DIAGNOSIS — E1165 Type 2 diabetes mellitus with hyperglycemia: Secondary | ICD-10-CM

## 2022-08-25 MED ORDER — TRULICITY 1.5 MG/0.5ML ~~LOC~~ SOAJ: 1.5000 mg | SUBCUTANEOUS | 0 refills | Status: DC

## 2022-08-25 NOTE — Telephone Encounter (Signed)
Called and informed pt that rx was sent in.

## 2022-08-25 NOTE — Addendum Note (Signed)
Addended by: Michela Pitcher on: 08/25/2022 01:27 PM   Modules accepted: Orders

## 2022-08-25 NOTE — Telephone Encounter (Signed)
We will start her on Trulicity 1.5 weekly for a month then we will titrate her back up to the 3.0mg . I have heard that there is some shortages of the 1.5

## 2022-08-27 ENCOUNTER — Other Ambulatory Visit: Payer: Self-pay | Admitting: Internal Medicine

## 2022-08-27 ENCOUNTER — Other Ambulatory Visit: Payer: Self-pay | Admitting: Nurse Practitioner

## 2022-08-27 DIAGNOSIS — Z794 Long term (current) use of insulin: Secondary | ICD-10-CM

## 2022-08-27 DIAGNOSIS — G8929 Other chronic pain: Secondary | ICD-10-CM

## 2022-09-01 ENCOUNTER — Telehealth: Payer: Self-pay | Admitting: Nurse Practitioner

## 2022-09-01 ENCOUNTER — Other Ambulatory Visit: Payer: Self-pay | Admitting: Nurse Practitioner

## 2022-09-01 DIAGNOSIS — E1165 Type 2 diabetes mellitus with hyperglycemia: Secondary | ICD-10-CM

## 2022-09-01 MED ORDER — EMPAGLIFLOZIN 25 MG PO TABS: 25.0000 mg | ORAL_TABLET | Freq: Every day | ORAL | 1 refills | Status: DC

## 2022-09-01 NOTE — Telephone Encounter (Signed)
Prescription Request  09/01/2022  LOV: 06/09/2022  What is the name of the medication or equipment? empagliflozin (JARDIANCE) 25 MG TABS tablet   Have you contacted your pharmacy to request a refill? Yes   Which pharmacy would you like this sent to?  [facility] - [place], [place] - [address] [place] [address] Phone: [phone] Fax: [phone]  Pharmacy called and needs approval from pcp to fill this medication.   Patient notified that their request is being sent to the clinical staff for review and that they should receive a response within 2 business days.   Please advise at [phone]

## 2022-09-01 NOTE — Telephone Encounter (Signed)
Pt states that she has already tired all of them things. She has ever tired fake nails. She said that when she put her hair behind her ear her nail get caught and get ripped off. Making pt an appointment.

## 2022-09-01 NOTE — Telephone Encounter (Signed)
Called and informed her to call the Endo but she stated that she did not need it. She did want to know if there is anything she can take for her nails splitting and bleeding. She stated that it started when she started taking her thyroid medication.

## 2022-09-01 NOTE — Telephone Encounter (Signed)
I do not think she sees endocrine any longer. Looks like I am managing her diabetes. She can try biotin over the counter supplement that can help strengthen nails. Also I had mention that make a coating that you can paint on the nails to help them from cracking and breaking

## 2022-09-03 ENCOUNTER — Telehealth (INDEPENDENT_AMBULATORY_CARE_PROVIDER_SITE_OTHER): Payer: 59 | Admitting: Nurse Practitioner

## 2022-09-03 ENCOUNTER — Encounter: Payer: Self-pay | Admitting: Nurse Practitioner

## 2022-09-03 VITALS — Ht 67.0 in | Wt 239.0 lb

## 2022-09-03 DIAGNOSIS — L603 Nail dystrophy: Secondary | ICD-10-CM | POA: Diagnosis not present

## 2022-09-03 NOTE — Progress Notes (Signed)
Patient ID: Valerie Lamb, female    DOB: [birth year], 67 y.o.   MRN: [id number]  Virtual visit completed through caregility, a video enabled telemedicine application. Due to national recommendations of social distancing due to COVID-19, a virtual visit is felt to be most appropriate for this patient at this time. Reviewed limitations, risks, security and privacy concerns of performing a virtual visit and the availability of in person appointments. I also reviewed that there may be a patient responsible charge related to this service. The patient agreed to proceed.   Patient location: home Provider location: [place] at [facility], office Persons participating in this virtual visit: patient, provider   If any vitals were documented, they were collected by patient at home unless specified below.    Ht 5\' 7"  (1.702 m)   Wt 239 lb (108.4 kg)   BMI 37.43 kg/m    CC: Nail problem Subjective:   HPI: Valerie Lamb is a 67 y.o. female presenting on 09/03/2022 for Nail Problem (Nails breaking)    Nail problem: states that it has been going on for a while, worse over the past 6 months. State that it has been getting worse. Sttes that she has tried biotin in the past. States that she has done nail polish on them and then fake nails. She states that she thinks it is related to her thyroid medication     Relevant past medical, surgical, family and social history reviewed and updated as indicated. Interim medical history since our last visit reviewed. Allergies and medications reviewed and updated. Outpatient Medications Prior to Visit  Medication Sig Dispense Refill   albuterol (VENTOLIN HFA) 108 (90 Base) MCG/ACT inhaler Inhale 1-2 puffs into the lungs every 6 (six) hours as needed for shortness of breath or wheezing.     amitriptyline (ELAVIL) 25 MG tablet TAKE ONE TABLET BY MOUTH EVERYDAY AT BEDTIME Needs appointment for further refills 30 tablet 3   atenolol (TENORMIN) 25 MG tablet Take 1  tablet (25 mg total) by mouth daily. 90 tablet 1   Continuous Blood Gluc Receiver ([place]) DEVI 1 each by Does not apply route daily. DX E11.22 1 each 2   Continuous Blood Gluc Sensor (DEXCOM G7 SENSOR) MISC Apply sensor every 10 days as directed 3 each 2   Dulaglutide (TRULICITY) 1.5 0000000 SOPN Inject 1.5 mg into the skin once a week. 2 mL 0   empagliflozin (JARDIANCE) 25 MG TABS tablet Take 1 tablet (25 mg total) by mouth daily before breakfast. 90 tablet 1   ibuprofen (ADVIL) 800 MG tablet Take 1 tablet (800 mg total) by mouth every 8 (eight) hours. 30 tablet 0   levothyroxine (SYNTHROID) 100 MCG tablet Take 1 tablet (100 mcg total) by mouth daily before breakfast. 90 tablet 1   lisinopril (ZESTRIL) 10 MG tablet Take 1 tablet (10 mg total) by mouth daily. 90 tablet 1   Melatonin 10 MG TABS Take 10 mg by mouth at bedtime.     omeprazole (PRILOSEC) 40 MG capsule Take 1 capsule (40 mg total) by mouth 2 (two) times daily. 180 capsule 1   pregabalin (LYRICA) 75 MG capsule TAKE ONE CAPSULE BY MOUTH TWICE DAILY 180 capsule 0   rosuvastatin (CRESTOR) 20 MG tablet TAKE ONE TABLET BY MOUTH EVERYDAY AT BEDTIME 90 tablet 3   Tiotropium Bromide Monohydrate 2.5 MCG/ACT AERS Inhale 2 puffs into the lungs daily. 3 each 0   No facility-administered medications prior to visit.     Per  HPI unless specifically indicated in ROS section below Review of Systems  Constitutional:  Negative for chills and fever.  Respiratory:  Negative for shortness of breath.   Cardiovascular:  Negative for chest pain.   Objective:  Ht 5\' 7"  (1.702 m)   Wt 239 lb (108.4 kg)   BMI 37.43 kg/m   Wt Readings from Last 3 Encounters:  09/03/22 239 lb (108.4 kg)  06/09/22 239 lb (108.4 kg)  02/17/22 233 lb 2 oz (105.7 kg)       Physical exam: Gen: alert, NAD, not ill appearing Pulm: speaks in complete sentences without increased work of breathing Psych: normal mood, normal thought content      Results for  orders placed or performed in visit on 06/09/22  CBC  Result Value Ref Range   WBC 5.5 4.0 - 10.5 K/uL   RBC 5.57 (H) 3.87 - 5.11 Mil/uL   Platelets 150.0 150.0 - 400.0 K/uL   Hemoglobin 16.6 (H) 12.0 - 15.0 g/dL   HCT 49.9 (H) 36.0 - 46.0 %   MCV 89.6 78.0 - 100.0 fl   MCHC 33.3 30.0 - 36.0 g/dL   RDW 14.9 11.5 - 15.5 %  IBC + Ferritin  Result Value Ref Range   Iron 93 42 - 145 ug/dL   Transferrin 241.0 212.0 - 360.0 mg/dL   Saturation Ratios 27.6 20.0 - 50.0 %   Ferritin 38.3 10.0 - 291.0 ng/mL   TIBC 337.4 250.0 - 450.0 mcg/dL  Vitamin B12  Result Value Ref Range   Vitamin B-12 316 211 - 911 pg/mL  Folate  Result Value Ref Range   Folate 12.7 >5.9 ng/mL  TSH  Result Value Ref Range   TSH 0.46 0.35 - 5.50 uIU/mL  HgB A1c  Result Value Ref Range   Hemoglobin A1C 8.0 (A) 4.0 - 5.6 %   HbA1c POC (<> result, manual entry)     HbA1c, POC (prediabetic range)     HbA1c, POC (controlled diabetic range)     Assessment & Plan:   Brittle nails Assessment & Plan: Patient is still dealing with very brittle nails that are splitting on the center and flaking off like "paper".  Patient has tried biotin over-the-counter along with fake nails and nail strengthening solution and nail polish without great relief.  Ambulatory referral to dermatology placed today.  Orders: -     Ambulatory referral to Dermatology     I discussed the assessment and treatment plan with the patient. The patient was provided an opportunity to ask questions and all were answered. The patient agreed with the plan and demonstrated an understanding of the instructions. The patient was advised to call back or seek an in-person evaluation if the symptoms worsen or if the condition fails to improve as anticipated.  Follow up plan: No follow-ups on file.  Romilda Garret, NP

## 2022-09-03 NOTE — Assessment & Plan Note (Signed)
Patient is still dealing with very brittle nails that are splitting on the center and flaking off like "paper".  Patient has tried biotin over-the-counter along with fake nails and nail strengthening solution and nail polish without great relief.  Ambulatory referral to dermatology placed today.

## 2022-09-10 ENCOUNTER — Telehealth: Payer: Self-pay

## 2022-09-10 NOTE — Progress Notes (Unsigned)
Care Management & Coordination Services Pharmacy Team  Reason for Encounter: Appointment Reminder  Contacted patient to confirm telephone appointment with Al Corpus , PharmD on 09/12/22 at 1:00. {US HC Outreach:[id number]}  Do you have any problems getting your medications? No  Satisfied with [facility] delivery   What is your top health concern you would like to discuss at your upcoming visit?   Have you seen any other providers since your last visit with PCP? No   Chart review:  Recent office visits:  06/09/22-James Cable,NP(PCP)- labs (A1c 8.0)-started rybelsus 3mg  f/u 3 months  Recent consult visits:  None since last contact   Hospital visits:  None in previous 6 months   Star Rating Drugs:  Medication:  Last Fill: Day Supply  [facility] Trulicity  08/28/22 28 Jardiance 25mg  08/04/22  30 Lisinopril 10mg  08/04/22  30 Rosuvastatin 20mg  08/04/22  30   Care Gaps: Annual wellness visit in last year? Yes  If Diabetic: Last eye exam / retinopathy screening:2023 Last diabetic foot exam:2022  Al Corpus, PharmD notified  Burt Knack, [facility] Clinical Pharmacy Assistant [phone]

## 2022-09-11 ENCOUNTER — Telehealth: Payer: Self-pay | Admitting: Nurse Practitioner

## 2022-09-11 NOTE — Telephone Encounter (Signed)
Patient called in and stated that she can't get in with the dermatologist until April of 2025. She was wondering if there was somewhere else she go with sooner availability. Thank you!

## 2022-09-11 NOTE — Telephone Encounter (Signed)
Called and advised pt to call around and when she found one that would take her insurance and could see her sooner to let us know.

## 2022-09-12 ENCOUNTER — Telehealth: Payer: Self-pay | Admitting: Pharmacist

## 2022-09-12 ENCOUNTER — Ambulatory Visit: Payer: 59 | Admitting: Pharmacist

## 2022-09-12 NOTE — Progress Notes (Signed)
Care Management & Coordination Services Pharmacy Note  09/12/2022 Name:  Valerie Lamb MRN:  [id number] DOB:  1955-11-29  Summary: F/U visit -DM: A1c 8.0% (06/2022) above goal; pt recently switched back to Trulicity from Rybelsus and glucose has improved (from 400s on Rybelsus to 200s on Trulicity 1.5 mg); the plan is to increase Trulicity back to 3 mg after a month  Recommendations/Changes made from today's visit: -Continue with current plan to increase Trulicity to 3 mg next month  Follow up plan: -Health Concierge will call patient monthly for medication coordination/delivery -Pharmacist follow up televisit scheduled for 3 months -PCP appt 10/03/22    Subjective: Valerie Lamb is an 67 y.o. year old female who is a primary patient of Toney Reil, Genene Churn, NP.  The care coordination team was consulted for assistance with disease management and care coordination needs.    Engaged with patient by telephone for follow up visit.  Recent office visits: 09/03/22 NP Audria Nine VV: brittle nails - tried biotin, nail solutions. Refer to dermatology.  06/09/22 NP Matt Toney Reil OV: DM - A1c 8.0%; c/o burning with Trulicity; switched to Rybelsus  02/17/22 Dr Selena Batten OV: annual - A1c 7.3%;  Recent consult visits: 12/16/21 Dr Marjory Lies (Neurology): numbness - likely DM neuropathy. Rx amitriptyline 25 mg HS.  Hospital visits: None in previous 6 months   Objective:  Lab Results  Component Value Date   CREATININE 0.91 02/17/2022   BUN 17 02/17/2022   GFR 66.45 02/17/2022   GFRNONAA >60 02/27/2021   NA 140 02/17/2022   K 4.1 02/17/2022   CALCIUM 9.4 02/17/2022   CO2 26 02/17/2022   GLUCOSE 92 02/17/2022    Lab Results  Component Value Date/Time   HGBA1C 8.0 (A) 06/09/2022 09:48 AM   HGBA1C 7.3 (A) 02/17/2022 09:32 AM   HGBA1C 10.2 (H) 07/12/2020 12:40 PM   GFR 66.45 02/17/2022 10:09 AM   GFR 72.38 08/09/2021 08:57 AM   MICROALBUR <0.7 02/17/2022 10:09 AM   MICROALBUR 4.7 (H) 10/24/2020 11:07  AM    Last diabetic Eye exam:  Lab Results  Component Value Date/Time   HMDIABEYEEXA No Retinopathy 07/04/2021 12:00 AM    Last diabetic Foot exam: No results found for: "HMDIABFOOTEX"   Lab Results  Component Value Date   CHOL 133 02/17/2022   HDL 46.70 02/17/2022   LDLCALC 59 02/17/2022   TRIG 133.0 02/17/2022   CHOLHDL 3 02/17/2022       Latest Ref Rng & Units 02/17/2022   10:09 AM 01/15/2021    8:56 AM 07/12/2020   12:40 PM  Hepatic Function  Total Protein 6.0 - 8.3 g/dL 6.6  6.7  7.1   Albumin 3.5 - 5.2 g/dL 4.0  3.9  4.1   AST 0 - 37 U/L 24  18  27    ALT 0 - 35 U/L 24  15  27    Alk Phosphatase 39 - 117 U/L 105  54  73   Total Bilirubin 0.2 - 1.2 mg/dL 0.5  0.7  0.8     Lab Results  Component Value Date/Time   TSH 0.46 06/09/2022 10:34 AM   TSH 1.65 08/09/2021 08:57 AM   FREET4 0.94 05/07/2020 09:19 AM       Latest Ref Rng & Units 06/09/2022   10:34 AM 02/17/2022   10:09 AM 02/27/2021    6:28 AM  CBC  WBC 4.0 - 10.5 K/uL 5.5  5.8  8.9   Hemoglobin 12.0 - 15.0 g/dL 37.8  58.8  14.0  Hematocrit 36.0 - 46.0 % 49.9  49.5  42.3   Platelets 150.0 - 400.0 K/uL 150.0  147.0  170     Lab Results  Component Value Date/Time   VITAMINB12 316 06/09/2022 10:34 AM   VITAMINB12 463 12/16/2021 09:38 AM    Clinical ASCVD: No  The ASCVD Risk score (Arnett DK, et al., 2019) failed to calculate for the following reasons:   Unable to determine if patient is Non-Hispanic African American        09/03/2022   10:41 AM 02/17/2022   10:29 AM 12/13/2021    1:41 PM  Depression screen PHQ 2/9  Decreased Interest 0 0 0  Down, Depressed, Hopeless 0 1 0  PHQ - 2 Score 0 1 0  Altered sleeping 0 2   Tired, decreased energy 3 1   Change in appetite 0 0   Feeling bad or failure about yourself  0 0   Trouble concentrating 0 0   Moving slowly or fidgety/restless 0 0   Suicidal thoughts 0 0   PHQ-9 Score 3 4   Difficult doing work/chores Somewhat difficult Somewhat difficult       Social History   Tobacco Use  Smoking Status Every Day   Packs/day: 0.50   Years: 46.00   Additional pack years: 0.00   Total pack years: 23.00   Types: Cigarettes   Start date: 1973  Smokeless Tobacco Never  Tobacco Comments   2 ppd previously, cut back 2021   BP Readings from Last 3 Encounters:  06/09/22 122/78  02/17/22 110/70  12/16/21 116/74   Pulse Readings from Last 3 Encounters:  06/09/22 (!) 55  02/17/22 64  12/16/21 67   Wt Readings from Last 3 Encounters:  09/03/22 239 lb (108.4 kg)  06/09/22 239 lb (108.4 kg)  02/17/22 233 lb 2 oz (105.7 kg)   BMI Readings from Last 3 Encounters:  09/03/22 37.43 kg/m  06/09/22 37.43 kg/m  02/17/22 36.51 kg/m    Allergies  Allergen Reactions   Semaglutide Diarrhea    Rybelsus tablet   Metformin Nausea And Vomiting    Medications Reviewed Today     Reviewed by Vertis Kelch, CMA (Certified Medical Assistant) on 09/03/22 at 1039  Med List Status: <None>   Medication Order Taking? Sig Documenting Provider Last Dose Status Informant  albuterol (VENTOLIN HFA) 108 (90 Base) MCG/ACT inhaler [id number] Yes Inhale 1-2 puffs into the lungs every 6 (six) hours as needed for shortness of breath or wheezing. [provider] Taking Active Self  amitriptyline (ELAVIL) 25 MG tablet [id number] Yes TAKE ONE TABLET BY MOUTH EVERYDAY AT BEDTIME Needs appointment for further refills Penumalli, Glenford Bayley, MD Taking Active   atenolol (TENORMIN) 25 MG tablet [id number] Yes Take 1 tablet (25 mg total) by mouth daily. Eden Emms, NP Taking Active   Continuous Blood Gluc Receiver (DEXCOM G7 RECEIVER) DEVI [id number] Yes 1 each by Does not apply route daily. DX E11.22 Eden Emms, NP Taking Active   Continuous Blood Gluc Sensor (DEXCOM G7 SENSOR) MISC [id number] Yes Apply sensor every 10 days as directed Gweneth Dimitri, MD Taking Active   Dulaglutide (TRULICITY) 1.5 MG/0.5ML SOPN [id number] Yes Inject 1.5 mg into the skin once a  week. Eden Emms, NP Taking Active   empagliflozin (JARDIANCE) 25 MG TABS tablet [id number] Yes Take 1 tablet (25 mg total) by mouth daily before breakfast. Eden Emms, NP Taking Active   ibuprofen (ADVIL) 800 MG tablet [id number] Yes Take 1  tablet (800 mg total) by mouth every 8 (eight) hours. Eden Emms, NP Taking Active   levothyroxine (SYNTHROID) 100 MCG tablet [id number] Yes Take 1 tablet (100 mcg total) by mouth daily before breakfast. Eden Emms, NP Taking Active   lisinopril (ZESTRIL) 10 MG tablet [id number] Yes Take 1 tablet (10 mg total) by mouth daily. Eden Emms, NP Taking Active   Melatonin 10 MG TABS [id number] Yes Take 10 mg by mouth at bedtime. [provider] Taking Active Self  omeprazole (PRILOSEC) 40 MG capsule [id number] Yes Take 1 capsule (40 mg total) by mouth 2 (two) times daily. Eden Emms, NP Taking Active   pregabalin (LYRICA) 75 MG capsule [id number] Yes TAKE ONE CAPSULE BY MOUTH TWICE DAILY Eden Emms, NP Taking Active   rosuvastatin (CRESTOR) 20 MG tablet [id number] Yes TAKE ONE TABLET BY MOUTH EVERYDAY AT BEDTIME Eden Emms, NP Taking Active   Tiotropium Bromide Monohydrate 2.5 MCG/ACT AERS [id number] Yes Inhale 2 puffs into the lungs daily. Gweneth Dimitri, MD Taking Active             SDOH:  (Social Determinants of Health) assessments and interventions performed: No SDOH Interventions    Flowsheet Row Office Visit from 10/17/2021 in [facility] Chronic Care Management from 09/17/2021 in [facility] at [facility] Chronic Care Management from 10/31/2020 in [facility] Visit from 05/07/2020 in [facility] [place] [facility] [place]  SDOH Interventions      Food Insecurity Interventions -- Intervention Not Indicated -- --  Depression Interventions/Treatment  Medication -- -- Medication  Financial Strain Interventions -- -- Intervention  Not Indicated --       Medication Assistance: None required.  Patient affirms current coverage meets needs.  Medication Access: Within the past 30 days, how often has patient missed a dose of medication? 0 Is a pillbox or other method used to improve adherence? Yes  Factors that may affect medication adherence? adverse effects of medications Are meds synced by current pharmacy? Yes  Are meds delivered by current pharmacy? Yes  Does patient experience delays in picking up medications due to transportation concerns? No   [facility] Reviewed: Is patient disadvantaged to use [facility]?: No  Current Rx insurance plan: [facility] Name and location of Current pharmacy:  [facility] - [place], [place] - [address] [place] [address] Phone: [phone] Fax: [phone]  [id number] - [place], [place] - [address] AT [facility] [address] [place] [address] Phone: [phone] Fax: [phone]  [facility] services reviewed with patient today?: Yes  30-day vials: Delivery 09/03/22 Jardiance  -take 1 tablet at breakfast  Lisinopril - take 1 tablet at breakfast Omeprazole - take 1 tablet breakfast, take 1 tablet bedtime  Pregabalin -take 1 tablet breakfast,take 1 tablet bedtime  Rosuvastatin  -take 1 tablet bedtime Amitriptyline . Take 1 tablet bedtime  Levothyroxine 174mcg-take 1 tablet before breakfast Atenolol -take 1 tablet daily Ibuprofen  -take 1 tablet every 8 hours as needed Dexcom G7   Compliance/Adherence/Medication fill history: Care Gaps: Lung cancer screening (never done) Foot exam (due 09/2021) DEXA (never done) Eye exam (due 07/2022) - [facility]  Star-Rating Drugs: Trulicity - PDC 98% Jardiance - PDC 100% Lisinopril - PDC 100% Rosuvastatin - PDC 100%   ASSESSMENT / PLAN  Hypertension (BP goal <140/90) -Controlled  -  per clinic readings  -Home BP readings: none available -Current treatment: Atenolol 25 mg - 1 tablet daily (primarily for tremor) - Appropriate, Effective, Safe, Accessible Lisinopril 10 mg daily - Appropriate, Effective, Safe, Accessible -Medications previously tried: propranolol - worsened COPD -Current exercise habits: walks her dog a lot and grandchildren keep her active -Denies hypotensive/hypertensive symptoms -Recommended to continue current medication  Hyperlipidemia: (LDL goal < 70) -Controlled - LDL 59 (01/2022) at goal; pt endorses compliance with statin -Current treatment: Rosuvastatin 20 mg daily HS - Appropriate, Effective, Safe, Accessible -Medications previously tried: n/a  -Educated on Cholesterol goals; Benefits of statin for ASCVD risk reduction; -Recommended to continue current medication  Diabetes (A1c goal <7%) -Not ideally controlled - A1c 8.0% (06/2022); pt recently switched back to Trulicity from Rybelsus, she reports frequent urination has improved -Current home glucose readings: low 200s (was > 400 without Trulicity) -Current medications: Jardiance 25 mg daily (AM) - Appropriate, Query Effective Trulicity 1.5 mg weekly (Wed) Appropriate, Query Effective Dexcom G7 w/ reader - Appropriate, Effective, Safe, Accessible -Medications previously tried: metformin (n/v), Actos, Rybelsus (N/V), Lantus -Recommended to continue current medication; increase Trulicity to 3 mg weekly next month  COPD (Goal: control symptoms and prevent exacerbations) -Controlled - per pt no issues with breathing/SOB recently; -Exacerbations requiring treatment in last 6 months: 0 -Current treatment  Spiriva Respimat 2.5 mg 2 puffs daily - Appropriate, Effective, Safe, Accessible Albuterol HFA- Appropriate, Effective, Safe, Accessible -Medications previously tried: n/a  -Patient reports consistent use of maintenance inhaler -Frequency of rescue inhaler use: PRN -Counseled on Benefits  of consistent maintenance inhaler use; When to use rescue inhaler -Recommended to continue current medication  Tobacco use (Goal: cessation) -Improving - pt reports she is down to 3 cigarettes a day since starting Chantix -Previous quit attempts: quit during pregnancies; tried Chantix 2023  Hypothyroidism (Goal TSH: 0.4-4.5) -Controlled - TSH 0.46 (06/2022) approaching hyperthyroid levels; pt c/o very brittle nails which has been associated with hyperthyroidism; however, pt has complained of brittle nails for months even when TSH was closer to middle of range (1.65 in 07/2021) -Current treatment: Levothyroxine 100 mcg daily - Appropriate, Effective, Safe, Accessible -Counseled to take medication n/a -Recommended to continue current medication  Health Maintenance -Hx GERD: on omeprazole 40 mg -Neuropathy: on amitriptylyine 25 mg, Lyrica 75 mg    Al Corpus, PharmD, [place], CPP Clinical Pharmacist Practitioner [facility] [phone]

## 2022-09-12 NOTE — Patient Instructions (Signed)
Visit Information  Phone number for Pharmacist: [phone]  Thank you for meeting with me to discuss your medications! Below is a summary of what we talked about during the visit:   Summary: F/U visit -DM: A1c 8.0% (06/2022) above goal; pt recently switched back to Trulicity from Rybelsus and glucose has improved (from 400s on Rybelsus to 200s on Trulicity 1.5 mg); the plan is to increase Trulicity back to 3 mg after a month  Recommendations/Changes made from today's visit: -Continue with current plan to increase Trulicity to 3 mg next month  Follow up plan: -Health Concierge will call patient monthly for medication coordination/delivery -Pharmacist follow up televisit scheduled for 3 months -PCP appt 10/03/22   Al Corpus, PharmD, BCACP Clinical Pharmacist [facility] [phone]

## 2022-09-12 NOTE — Telephone Encounter (Signed)
Care Management & Coordination Services Outreach Note  09/12/2022 Name: Valerie Lamb MRN: [id number] DOB: February 26, 1956  Referred by: Eden Emms, NP  Patient had a phone appointment scheduled with clinical pharmacist today.  An unsuccessful telephone outreach was attempted today. The patient was referred to the pharmacist for assistance with medications, care management and care coordination.   Patient will NOT be penalized in any way for missing a Care Management & Coordination Services appointment. The no-show fee does not apply.  If possible, a message was left to return call to: [phone] or to [facility].  Al Corpus, PharmD, BCACP Clinical Pharmacist  Primary Care at [facility] [phone]

## 2022-09-22 ENCOUNTER — Other Ambulatory Visit: Payer: Self-pay

## 2022-09-22 DIAGNOSIS — E1165 Type 2 diabetes mellitus with hyperglycemia: Secondary | ICD-10-CM

## 2022-09-22 DIAGNOSIS — J449 Chronic obstructive pulmonary disease, unspecified: Secondary | ICD-10-CM

## 2022-09-22 NOTE — Addendum Note (Signed)
Addended by: Kathyrn Sheriff on: 09/22/2022 11:18 AM   Modules accepted: Orders

## 2022-09-22 NOTE — Progress Notes (Signed)
Care Management & Coordination Services Pharmacy Team  Reason for Encounter: Medication refill  Received call from patient regarding medication management via [facility].  Patient requested an acute fill for Trulicity , Spiriva, and Albuterol inhaler to be delivered:  09/23/22 Pharmacy needs refills? Yes  Confirmed delivery date of 09/23/22, advised patient that pharmacy will contact them the morning of delivery.  Al Corpus, PharmD notified  Burt Knack, [facility] Clinical Pharmacy Assistant [phone]

## 2022-09-22 NOTE — Telephone Encounter (Addendum)
The plan was to increase Trulicity from 1.5 to 3 mg after 1 month. Routing to PCP for Trulicity 3 mg Rx.  Patient also requests refills for Spiriva and albuterol.   Pharmacy:  [facility] - [place], [place] - [address] [place] [address] Phone: [phone] Fax: [phone]

## 2022-09-23 MED ORDER — ALBUTEROL SULFATE HFA 108 (90 BASE) MCG/ACT IN AERS
1.0000 | INHALATION_SPRAY | Freq: Four times a day (QID) | RESPIRATORY_TRACT | 6 refills | Status: DC | PRN
Start: 2022-09-23 — End: 2024-04-13

## 2022-09-23 MED ORDER — TIOTROPIUM BROMIDE MONOHYDRATE 2.5 MCG/ACT IN AERS
2.0000 | INHALATION_SPRAY | Freq: Every day | RESPIRATORY_TRACT | 2 refills | Status: DC
Start: 2022-09-23 — End: 2024-01-01

## 2022-09-23 MED ORDER — TRULICITY 3 MG/0.5ML ~~LOC~~ SOAJ: 3.0000 mg | SUBCUTANEOUS | 1 refills | Status: DC

## 2022-09-24 ENCOUNTER — Telehealth: Payer: Self-pay

## 2022-09-24 NOTE — Progress Notes (Signed)
Care Management & Coordination Services Pharmacy Team  Reason for Encounter: Medication coordination and delivery  Contacted patient to discuss medications and coordinate delivery from [facility]. Spoke with patient on 09/24/2022  Cycle dispensing form sent to [facility]  for review.   Last adherence delivery date:09/03/22      Patient is due for next adherence delivery on: 10/03/22  This delivery to include: Vials  30 Days  Easy Open Caps  Jardiance  -take 1 tablet at breakfast  Lisinopril - take 1 tablet at breakfast Omeprazole - take 1 tablet breakfast, take 1 tablet bedtime  Pregabalin -take 1 tablet breakfast,take 1 tablet bedtime  Rosuvastatin  -take 1 tablet bedtime Amitriptyline . Take 1 tablet bedtime  Levothyroxine 198mcg-take 1 tablet before breakfast Atenolol -take 1 tablet daily Ibuprofen  -take 1 tablet every 8 hours as needed Trulicity - inject once weekly   Patient declined the following medications this month: none  Refills requested from providers include: trulicity,jardiance  Confirmed delivery date of 10/03/22, advised patient that pharmacy will contact them the morning of delivery.   Any concerns about your medications? Yes  took last dose of trulicity, needs to increase to  injection once weekly. Acute fill requested.Patient has not received as of this call   How often do you forget or accidentally miss a dose? Never  Do you use a pillbox? No  Is patient in packaging No   Recent blood pressure readings are as follows:none available   Recent blood glucose readings are as follows:none available   Chart review: Recent office visits:  09/12/22- Mardella Layman Foltanski,RPH- telemedicine- increase trulicity to . Once weekly.  Recent consult visits:  None since last contact  Hospital visits:  None in previous 6 months  Medications: Outpatient Encounter Medications as of 09/24/2022  Medication Sig    albuterol (VENTOLIN HFA) 108 (90 Base) MCG/ACT inhaler Inhale 1-2 puffs into the lungs every 6 (six) hours as needed for shortness of breath or wheezing.   amitriptyline (ELAVIL) 25 MG tablet TAKE ONE TABLET BY MOUTH EVERYDAY AT BEDTIME Needs appointment for further refills   atenolol (TENORMIN) 25 MG tablet Take 1 tablet (25 mg total) by mouth daily.   Continuous Blood Gluc Receiver (DEXCOM G7 RECEIVER) DEVI 1 each by Does not apply route daily. DX E11.22   Continuous Blood Gluc Sensor (DEXCOM G7 SENSOR) MISC Apply sensor every 10 days as directed   Dulaglutide (TRULICITY) 3 MG/0.5ML SOPN Inject 3 mg into the skin once a week.   empagliflozin (JARDIANCE) 25 MG TABS tablet Take 1 tablet (25 mg total) by mouth daily before breakfast.   ibuprofen (ADVIL) 800 MG tablet Take 1 tablet (800 mg total) by mouth every 8 (eight) hours.   levothyroxine (SYNTHROID) 100 MCG tablet Take 1 tablet (100 mcg total) by mouth daily before breakfast.   lisinopril (ZESTRIL) 10 MG tablet Take 1 tablet (10 mg total) by mouth daily.   Melatonin 10 MG TABS Take 10 mg by mouth at bedtime.   omeprazole (PRILOSEC) 40 MG capsule Take 1 capsule (40 mg total) by mouth 2 (two) times daily.   pregabalin (LYRICA) 75 MG capsule TAKE ONE CAPSULE BY MOUTH TWICE DAILY   rosuvastatin (CRESTOR) 20 MG tablet TAKE ONE TABLET BY MOUTH EVERYDAY AT BEDTIME   Tiotropium Bromide Monohydrate 2.5 MCG/ACT AERS Inhale 2 puffs into the lungs daily.   No facility-administered encounter medications on file as of 09/24/2022.   BP Readings from Last 3 Encounters:  06/09/22 122/78  02/17/22  110/70  12/16/21 116/74    Pulse Readings from Last 3 Encounters:  06/09/22 (!) 55  02/17/22 64  12/16/21 67    Lab Results  Component Value Date/Time   HGBA1C 8.0 (A) 06/09/2022 09:48 AM   HGBA1C 7.3 (A) 02/17/2022 09:32 AM   HGBA1C 10.2 (H) 07/12/2020 12:40 PM   Lab Results  Component Value Date   CREATININE 0.91 02/17/2022   BUN 17 02/17/2022    GFR 66.45 02/17/2022   GFRNONAA >60 02/27/2021   NA 140 02/17/2022   K 4.1 02/17/2022   CALCIUM 9.4 02/17/2022   CO2 26 02/17/2022     Al Corpus, PharmD notified  Burt Knack, [facility] Clinical Pharmacy Assistant [phone]

## 2022-09-29 ENCOUNTER — Telehealth: Payer: Self-pay | Admitting: Pharmacist

## 2022-09-29 MED ORDER — TRULICITY 1.5 MG/0.5ML ~~LOC~~ SOAJ: 1.5000 mg | SUBCUTANEOUS | 0 refills | Status: DC

## 2022-09-29 NOTE — Telephone Encounter (Signed)
Will do one month at a time to see if stock issue gets addressed

## 2022-09-29 NOTE — Telephone Encounter (Signed)
Trulicity 3 mg is on back order and pt was not able to find it at [facility].  [facility] does have Trulicity 1.5 mg in stock, but would need refill sent in.  Routing to PCP.   [facility] - [place], [place] - [address] [place] [address] Phone: [phone] Fax: [phone]

## 2022-10-01 ENCOUNTER — Other Ambulatory Visit: Payer: Self-pay | Admitting: Nurse Practitioner

## 2022-10-01 DIAGNOSIS — G8929 Other chronic pain: Secondary | ICD-10-CM

## 2022-10-02 NOTE — Telephone Encounter (Signed)
Patient needs to be scheduled per my last office note

## 2022-10-03 ENCOUNTER — Encounter: Payer: Self-pay | Admitting: Nurse Practitioner

## 2022-10-03 ENCOUNTER — Ambulatory Visit (INDEPENDENT_AMBULATORY_CARE_PROVIDER_SITE_OTHER): Payer: 59 | Admitting: Nurse Practitioner

## 2022-10-03 VITALS — BP 116/64 | HR 72 | Temp 97.8°F | Resp 16 | Ht 67.0 in | Wt 228.0 lb

## 2022-10-03 DIAGNOSIS — E1165 Type 2 diabetes mellitus with hyperglycemia: Secondary | ICD-10-CM | POA: Diagnosis not present

## 2022-10-03 DIAGNOSIS — R252 Cramp and spasm: Secondary | ICD-10-CM

## 2022-10-03 DIAGNOSIS — Z7984 Long term (current) use of oral hypoglycemic drugs: Secondary | ICD-10-CM | POA: Diagnosis not present

## 2022-10-03 DIAGNOSIS — Z794 Long term (current) use of insulin: Secondary | ICD-10-CM

## 2022-10-03 DIAGNOSIS — Z79899 Other long term (current) drug therapy: Secondary | ICD-10-CM

## 2022-10-03 DIAGNOSIS — E1159 Type 2 diabetes mellitus with other circulatory complications: Secondary | ICD-10-CM

## 2022-10-03 DIAGNOSIS — Z5181 Encounter for therapeutic drug level monitoring: Secondary | ICD-10-CM

## 2022-10-03 DIAGNOSIS — I152 Hypertension secondary to endocrine disorders: Secondary | ICD-10-CM

## 2022-10-03 LAB — COMPREHENSIVE METABOLIC PANEL
ALT: 71 U/L — ABNORMAL HIGH (ref 0–35)
AST: 57 U/L — ABNORMAL HIGH (ref 0–37)
Albumin: 3.8 g/dL (ref 3.5–5.2)
Alkaline Phosphatase: 121 U/L — ABNORMAL HIGH (ref 39–117)
BUN: 13 mg/dL (ref 6–23)
CO2: 25 mEq/L (ref 19–32)
Calcium: 9 mg/dL (ref 8.4–10.5)
Chloride: 102 mEq/L (ref 96–112)
Creatinine, Ser: 0.86 mg/dL (ref 0.40–1.20)
GFR: 70.8 mL/min (ref >60.00)
Glucose, Bld: 240 mg/dL — ABNORMAL HIGH (ref 70–99)
Potassium: 3.5 mEq/L (ref 3.5–5.1)
Sodium: 138 mEq/L (ref 135–145)
Total Bilirubin: 0.6 mg/dL (ref 0.2–1.2)
Total Protein: 6.6 g/dL (ref 6.0–8.3)

## 2022-10-03 LAB — CBC
HCT: 51 % — ABNORMAL HIGH (ref 36.0–46.0)
Hemoglobin: 17.4 g/dL — ABNORMAL HIGH (ref 12.0–15.0)
MCHC: 34 g/dL (ref 30.0–36.0)
MCV: 89.6 fl (ref 78.0–100.0)
Platelets: 137 10*3/uL — ABNORMAL LOW (ref 150.0–400.0)
RBC: 5.69 Mil/uL — ABNORMAL HIGH (ref 3.87–5.11)
RDW: 14.9 % (ref 11.5–15.5)
WBC: 6.9 10*3/uL (ref 4.0–10.5)

## 2022-10-03 LAB — MAGNESIUM: Magnesium: 1.9 mg/dL (ref 1.5–2.5)

## 2022-10-03 LAB — POCT GLYCOSYLATED HEMOGLOBIN (HGB A1C): Hemoglobin A1C: 15 % — AB (ref 4.0–5.6)

## 2022-10-03 MED ORDER — GLIPIZIDE 5 MG PO TABS
5.0000 mg | ORAL_TABLET | Freq: Two times a day (BID) | ORAL | 3 refills | Status: DC
Start: 2022-10-03 — End: 2023-01-09

## 2022-10-03 NOTE — Assessment & Plan Note (Signed)
Could be multifactorial long-term PPI therapy will check magnesium and electrolytes.  Patient also have uncontrolled glucose with A1c of 15 could be pulling extra water causing dehydration and cramps.

## 2022-10-03 NOTE — Assessment & Plan Note (Signed)
Long-term PPI therapy patient complaining of cramps could be multifactorial we will check magnesium today.

## 2022-10-03 NOTE — Patient Instructions (Signed)
Nice to see you today I will be in touch with the labs I have added on the glipizide medication to help with the sugars

## 2022-10-03 NOTE — Assessment & Plan Note (Addendum)
Patient currently maintained on atenolol.  Blood pressure within normal limits today.  Continue taking medication as prescribed

## 2022-10-03 NOTE — Progress Notes (Signed)
Established Patient Office Visit  Subjective   Patient ID: Valerie Lamb, female    DOB: 06/22/1955  Age: 67 y.o. MRN: [id number]  Chief Complaint  Patient presents with   Diabetes      DM2: patient is using a dexcom. She is suppose to be on trulicity 3 mg and jardiance 25 mg. States that we went to rybellus from trucilty and did not have good experience as she had an adverse drug event. Went back to truclicity and has been having trouble getting the 3mg . She is currently on trulicity 1.5mg  has been on it for approx 5 weeks. States that she has not had a period of time where she had weeks without medication    States that the diet has not really changed.  She has mentioned that she has been drinking more water as of late. States that she has been going through approx half a case of water a day.     Review of Systems  Constitutional:  Positive for weight loss.       Decreased appetite  Respiratory:  Negative for cough.   Cardiovascular:  Negative for chest pain.  Gastrointestinal:  Positive for nausea. Negative for abdominal pain, diarrhea and vomiting.  Genitourinary:        Increased thirst       Objective:     BP 116/64   Pulse 72   Temp 97.8 F (36.6 C)   Resp 16   Ht 5\' 7"  (1.702 m)   Wt 228 lb (103.4 kg)   SpO2 95%   BMI 35.71 kg/m  BP Readings from Last 3 Encounters:  10/03/22 116/64  06/09/22 122/78  02/17/22 110/70   Wt Readings from Last 3 Encounters:  10/03/22 228 lb (103.4 kg)  09/03/22 239 lb (108.4 kg)  06/09/22 239 lb (108.4 kg)      Physical Exam Vitals and nursing note reviewed.  Constitutional:      Appearance: Normal appearance.  Cardiovascular:     Rate and Rhythm: Normal rate and regular rhythm.     Pulses:          Dorsalis pedis pulses are 2+ on the right side and 2+ on the left side.       Posterior tibial pulses are 2+ on the right side and 2+ on the left side.     Heart sounds: Normal heart sounds.  Pulmonary:     Effort:  Pulmonary effort is normal.     Breath sounds: Normal breath sounds.  Abdominal:     General: Bowel sounds are normal. There is no distension.     Palpations: There is no mass.     Tenderness: There is no abdominal tenderness.     Hernia: No hernia is present.  Feet:     Right foot:     Skin integrity: Skin integrity normal.     Left foot:     Skin integrity: Skin integrity normal.  Neurological:     Mental Status: She is alert.      Results for orders placed or performed in visit on 10/03/22  POCT glycosylated hemoglobin (Hb A1C)  Result Value Ref Range   Hemoglobin A1C 15.0 (A) 4.0 - 5.6 %   HbA1c POC (<> result, manual entry)     HbA1c, POC (prediabetic range)     HbA1c, POC (controlled diabetic range)        The ASCVD Risk score (Arnett DK, et al., 2019) failed to calculate for the following  reasons:   Unable to determine if patient is Non-Hispanic African American    Assessment & Plan:   Problem List Items Addressed This Visit       Cardiovascular and Mediastinum   Hypertension associated with diabetes (HCC)    Patient currently maintained on atenolol.  Blood pressure within normal limits today.  Continue taking medication as prescribed      Relevant Medications   glipiZIDE (GLUCOTROL) 5 MG tablet     Endocrine   Type 2 diabetes mellitus with hyperglycemia, with long-term current use of insulin (HCC) - Primary    Patient had a huge increase in her A1c today.  We did try to switch from Trulicity to Rybelsus patient had adverse drug event switch back to Trulicity but cannot get the appropriate dosage.  Still cannot get Trulicity 3 mg.  Will add on glipizide 5 mg twice daily to aid in glucose reduction while we wait on stock of Trulicity 3 mg.  Patient does have CGM.  She was informed if she has frequent low blood glucoses to let me know we will adjust medications      Relevant Medications   glipiZIDE (GLUCOTROL) 5 MG tablet     Other   Cramps of lower  extremity    Could be multifactorial long-term PPI therapy will check magnesium and electrolytes.  Patient also have uncontrolled glucose with A1c of 15 could be pulling extra water causing dehydration and cramps.      Relevant Orders   Magnesium   Encounter for monitoring long-term proton pump inhibitor therapy    Long-term PPI therapy patient complaining of cramps could be multifactorial we will check magnesium today.      Relevant Orders   Magnesium    Return in about 3 months (around 01/03/2023) for DM recheck.    Audria Nine, NP

## 2022-10-03 NOTE — Assessment & Plan Note (Signed)
Patient had a huge increase in her A1c today.  We did try to switch from Trulicity to Rybelsus patient had adverse drug event switch back to Trulicity but cannot get the appropriate dosage.  Still cannot get Trulicity 3 mg.  Will add on glipizide 5 mg twice daily to aid in glucose reduction while we wait on stock of Trulicity 3 mg.  Patient does have CGM.  She was informed if she has frequent low blood glucoses to let me know we will adjust medications

## 2022-10-06 ENCOUNTER — Telehealth: Payer: Self-pay

## 2022-10-06 NOTE — Progress Notes (Signed)
Care Management & Coordination Services Pharmacy Team  Reason for Encounter: Medication Change    Spoke with patient on 10/06/2022   10/03/22- Valerie Marseilles Cable,NP(PCP) A1c 15.0 Patient had a huge increase in her A1c today.  We did try to switch from Trulicity to Rybelsus patient had adverse drug event switch back to Trulicity but cannot get the appropriate dosage.  Still cannot get Trulicity 3 mg.  Will add on glipizide 5 mg twice daily to aid in glucose reduction while we wait on stock of Trulicity 3 mg.  Patient does have CGM.   [facility] to deliver glipizide today.  Al Corpus, PharmD notified  Burt Knack, [facility] Clinical Pharmacy Assistant [phone]

## 2022-10-21 ENCOUNTER — Other Ambulatory Visit: Payer: Self-pay | Admitting: Nurse Practitioner

## 2022-10-21 ENCOUNTER — Telehealth: Payer: Self-pay

## 2022-10-21 ENCOUNTER — Other Ambulatory Visit: Payer: Self-pay | Admitting: Diagnostic Neuroimaging

## 2022-10-21 DIAGNOSIS — E1122 Type 2 diabetes mellitus with diabetic chronic kidney disease: Secondary | ICD-10-CM

## 2022-10-21 NOTE — Progress Notes (Signed)
Care Management & Coordination Services Pharmacy Team  Reason for Encounter: Medication refill    Spoke with patient on 10/21/2022   Patient contacted me for a refill on her dexcom G7 sensors as she is out of. Patient states she used her last Trulicity 1.5mg  weekly injection this am.Patient is on the Trulicity 3mg  weekly injection and is anxious for the problem with back order to resolve. This dose helps reduce her am BG's .Request sent to [facility] for refills.   Al Corpus, PharmD notified  Burt Knack, [facility] Clinical Pharmacy Assistant [phone]

## 2022-10-23 MED ORDER — DEXCOM G7 SENSOR MISC
5 refills | Status: DC
Start: 2022-10-23 — End: 2023-03-26

## 2022-10-23 NOTE — Addendum Note (Signed)
Addended by: Kathyrn Sheriff on: 10/23/2022 09:20 AM   Modules accepted: Orders

## 2022-10-24 ENCOUNTER — Telehealth: Payer: Self-pay

## 2022-10-24 NOTE — Progress Notes (Signed)
Care Management & Coordination Services Pharmacy Team  Reason for Encounter: Medication coordination and delivery  Contacted patient to discuss medications and coordinate delivery from [facility]. Spoke with patient on 10/24/2022  Cycle dispensing form sent to [facility]  for review.   Last adherence delivery date:10/03/22      Patient is due for next adherence delivery on: 11/03/22  This delivery to include: Vials  30 Days  easy open  Jardiance 25mg  -take 1 tablet at breakfast  Lisinopril 10mg - take 1 tablet at breakfast Omeprazole 40mg - take 1 tablet breakfast, take 1 tablet bedtime  Pregabalin 75mg -take 1 tablet breakfast,take 1 tablet bedtime  Rosuvastatin 20mg  -take 1 tablet bedtime Amitriptyline 25mg . Take 1 tablet bedtime  Levothyroxine 163mcg-take 1 tablet before breakfast Atenolol 25mg -take 1 tablet daily Ibuprofen 800mg  -take 1 tablet every 8 hours as needed Trulicity 3mg - inject once weekly    Dexcom G7 sensors- apply 1 every 10 days   Patient declined the following medications this month: none  Refills requested from providers include: pregabalin  Confirmed delivery date of 11/03/22, advised patient that pharmacy will contact them the morning of delivery.   Any concerns about your medications? Yes  Continues to have to use 1.5mg  trulicity when patient is on 3mg . Took last injection this past Tuesday. [facility] notified. Patient reports she has 2 pills left of levothyrozine . take 1 tablet daily. Patient reports she will be out of Dexcom sensors 10/30/22.  How often do you forget or accidentally miss a dose? Never  Do you use a pillbox? No  Is patient in packaging No  Recent blood pressure readings are as follows:none available   Recent blood glucose readings are as follows:none available    Chart review: Recent office visits:  None since last contact   Recent consult visits:  None since last contact   Hospital visits:  None in previous  6 months  Medications: Outpatient Encounter Medications as of 10/24/2022  Medication Sig   albuterol (VENTOLIN HFA) 108 (90 Base) MCG/ACT inhaler Inhale 1-2 puffs into the lungs every 6 (six) hours as needed for shortness of breath or wheezing.   amitriptyline (ELAVIL) 25 MG tablet TAKE ONE TABLET BY MOUTH EVERYDAY AT BEDTIME Needs appointment for further refills   atenolol (TENORMIN) 25 MG tablet Take 1 tablet (25 mg total) by mouth daily.   Continuous Blood Gluc Receiver (DEXCOM G7 RECEIVER) DEVI 1 each by Does not apply route daily. DX E11.22   Continuous Glucose Sensor (DEXCOM G7 SENSOR) MISC Apply sensor every 10 days as directed   empagliflozin (JARDIANCE) 25 MG TABS tablet Take 1 tablet (25 mg total) by mouth daily before breakfast.   glipiZIDE (GLUCOTROL) 5 MG tablet Take 1 tablet (5 mg total) by mouth 2 (two) times daily before a meal.   ibuprofen (ADVIL) 800 MG tablet TAKE ONE TABLET BY MOUTH every EIGHT hours   levothyroxine (SYNTHROID) 100 MCG tablet Take 1 tablet (100 mcg total) by mouth daily before breakfast.   lisinopril (ZESTRIL) 10 MG tablet Take 1 tablet (10 mg total) by mouth daily.   Melatonin 10 MG TABS Take 10 mg by mouth at bedtime.   omeprazole (PRILOSEC) 40 MG capsule Take 1 capsule (40 mg total) by mouth 2 (two) times daily.   pregabalin (LYRICA) 75 MG capsule TAKE ONE CAPSULE BY MOUTH TWICE DAILY   rosuvastatin (CRESTOR) 20 MG tablet TAKE ONE TABLET BY MOUTH EVERYDAY AT BEDTIME   Tiotropium Bromide Monohydrate 2.5 MCG/ACT AERS Inhale 2 puffs into  the lungs daily.   TRULICITY 1.5 MG/0.5ML SOPN Inject 1.5 mg into the skin once a week.   No facility-administered encounter medications on file as of 10/24/2022.   BP Readings from Last 3 Encounters:  10/03/22 116/64  06/09/22 122/78  02/17/22 110/70    Pulse Readings from Last 3 Encounters:  10/03/22 72  06/09/22 (!) 55  02/17/22 64    Lab Results  Component Value Date/Time   HGBA1C 15.0 (A) 10/03/2022 10:02 AM    HGBA1C 8.0 (A) 06/09/2022 09:48 AM   HGBA1C 10.2 (H) 07/12/2020 12:40 PM   Lab Results  Component Value Date   CREATININE 0.86 10/03/2022   BUN 13 10/03/2022   GFR 70.80 10/03/2022   GFRNONAA >60 02/27/2021   NA 138 10/03/2022   K 3.5 10/03/2022   CALCIUM 9.0 10/03/2022   CO2 25 10/03/2022     Al Corpus, PharmD notified  Burt Knack, [facility] Clinical Pharmacy Assistant [phone]

## 2022-10-30 ENCOUNTER — Encounter: Payer: 59 | Admitting: Pharmacist

## 2022-10-30 ENCOUNTER — Telehealth: Payer: Self-pay | Admitting: Pharmacist

## 2022-10-30 NOTE — Telephone Encounter (Signed)
Care Management & Coordination Services Outreach Note  10/30/2022 Name: Valerie Lamb MRN: [id number] DOB: Sep 15, 1955  Referred by: Eden Emms, NP  Patient had a phone appointment scheduled with clinical pharmacist today.  An unsuccessful telephone outreach was attempted today. The patient was referred to the pharmacist for assistance with medications, care management and care coordination.   Patient will NOT be penalized in any way for missing a Care Management & Coordination Services appointment. The no-show fee does not apply.  If possible, a message was left to return call to: [phone] or to [facility].  Al Corpus, PharmD, BCACP Clinical Pharmacist [facility] at [facility] [phone]

## 2022-10-30 NOTE — Progress Notes (Unsigned)
Care Management & Coordination Services Pharmacy Note  10/30/2022 Name:  Valerie Lamb MRN:  [id number] DOB:  [birth year]  Summary: F/U visit -DM: A1c 8.0% (06/2022) above goal; pt recently switched back to Trulicity from Rybelsus and glucose has improved (from 400s on Rybelsus to 200s on Trulicity 1.5 mg); the plan is to increase Trulicity back to 3 mg after a month  Recommendations/Changes made from today's visit: -Continue with current plan to increase Trulicity to 3 mg next month  Follow up plan: -Health Concierge will call patient monthly for medication coordination/delivery -Pharmacist follow up televisit scheduled for 3 months -PCP 01/09/23    Subjective: Valerie Lamb is an 67 y.o. year old female who is a primary patient of Toney Reil, Genene Churn, NP.  The care coordination team was consulted for assistance with disease management and care coordination needs.    Engaged with patient by telephone for follow up visit.  Recent office visits: 10/03/22 NP Matt Cable OV: f/u - A1c 15.0%; continues Trulicity 1.5 mg, unable to get 3 mg dose d/t backorder. Add glipizide 5 mg BID  09/03/22 NP Matt Cable VV: brittle nails - tried biotin, nail solutions. Refer to dermatology.  06/09/22 NP Matt Toney Reil OV: DM - A1c 8.0%; c/o burning with Trulicity; switched to Rybelsus  02/17/22 Dr Selena Batten OV: annual - A1c 7.3%;  Recent consult visits: 12/16/21 Dr Marjory Lies (Neurology): numbness - likely DM neuropathy. Rx amitriptyline 25 mg HS.  Hospital visits: None in previous 6 months   Objective:  Lab Results  Component Value Date   CREATININE 0.86 10/03/2022   BUN 13 10/03/2022   GFR 70.80 10/03/2022   GFRNONAA >60 02/27/2021   NA 138 10/03/2022   K 3.5 10/03/2022   CALCIUM 9.0 10/03/2022   CO2 25 10/03/2022   GLUCOSE 240 (H) 10/03/2022    Lab Results  Component Value Date/Time   HGBA1C 15.0 (A) 10/03/2022 10:02 AM   HGBA1C 8.0 (A) 06/09/2022 09:48 AM   HGBA1C 10.2 (H) 07/12/2020 12:40 PM   GFR  70.80 10/03/2022 10:23 AM   GFR 66.45 02/17/2022 10:09 AM   MICROALBUR <0.7 02/17/2022 10:09 AM   MICROALBUR 4.7 (H) 10/24/2020 11:07 AM    Last diabetic Eye exam:  Lab Results  Component Value Date/Time   HMDIABEYEEXA No Retinopathy 07/04/2021 12:00 AM    Last diabetic Foot exam: No results found for: "HMDIABFOOTEX"   Lab Results  Component Value Date   CHOL 133 02/17/2022   HDL 46.70 02/17/2022   LDLCALC 59 02/17/2022   TRIG 133.0 02/17/2022   CHOLHDL 3 02/17/2022       Latest Ref Rng & Units 10/03/2022   10:23 AM 02/17/2022   10:09 AM 01/15/2021    8:56 AM  Hepatic Function  Total Protein 6.0 - 8.3 g/dL 6.6  6.6  6.7   Albumin 3.5 - 5.2 g/dL 3.8  4.0  3.9   AST 0 - 37 U/L 57  24  18   ALT 0 - 35 U/L 71  24  15   Alk Phosphatase 39 - 117 U/L 121  105  54   Total Bilirubin 0.2 - 1.2 mg/dL 0.6  0.5  0.7     Lab Results  Component Value Date/Time   TSH 0.46 06/09/2022 10:34 AM   TSH 1.65 08/09/2021 08:57 AM   FREET4 0.94 05/07/2020 09:19 AM       Latest Ref Rng & Units 10/03/2022   10:23 AM 06/09/2022   10:34 AM 02/17/2022   10:09  AM  CBC  WBC 4.0 - 10.5 K/uL 6.9  5.5  5.8   Hemoglobin 12.0 - 15.0 g/dL 16.1  09.6  04.5   Hematocrit 36.0 - 46.0 % 51.0  49.9  49.5   Platelets 150.0 - 400.0 K/uL 137.0  150.0  147.0     Lab Results  Component Value Date/Time   VITAMINB12 316 06/09/2022 10:34 AM   VITAMINB12 463 12/16/2021 09:38 AM    Clinical ASCVD: No  The ASCVD Risk score (Arnett DK, et al., 2019) failed to calculate for the following reasons:   Unable to determine if patient is Non-Hispanic African American        10/03/2022   10:05 AM 09/03/2022   10:41 AM 02/17/2022   10:29 AM  Depression screen PHQ 2/9  Decreased Interest 1 0 0  Down, Depressed, Hopeless 1 0 1  PHQ - 2 Score 2 0 1  Altered sleeping 3 0 2  Tired, decreased energy 3 3 1   Change in appetite 3 0 0  Feeling bad or failure about yourself  0 0 0  Trouble concentrating 1 0 0  Moving slowly or  fidgety/restless 0 0 0  Suicidal thoughts 0 0 0  PHQ-9 Score 12 3 4   Difficult doing work/chores Not difficult at all Somewhat difficult Somewhat difficult     Social History   Tobacco Use  Smoking Status Every Day   Packs/day: 0.50   Years: 46.00   Additional pack years: 0.00   Total pack years: 23.00   Types: Cigarettes   Start date: 1973  Smokeless Tobacco Never  Tobacco Comments   2 ppd previously, cut back 2021   BP Readings from Last 3 Encounters:  10/03/22 116/64  06/09/22 122/78  02/17/22 110/70   Pulse Readings from Last 3 Encounters:  10/03/22 72  06/09/22 (!) 55  02/17/22 64   Wt Readings from Last 3 Encounters:  10/03/22 228 lb (103.4 kg)  09/03/22 239 lb (108.4 kg)  06/09/22 239 lb (108.4 kg)   BMI Readings from Last 3 Encounters:  10/03/22 35.71 kg/m  09/03/22 37.43 kg/m  06/09/22 37.43 kg/m    Allergies  Allergen Reactions   Semaglutide Diarrhea    Rybelsus tablet   Metformin Nausea And Vomiting    Medications Reviewed Today     Reviewed by Vertis Kelch, CMA (Certified Medical Assistant) on 10/03/22 at [phone]  Med List Status: <None>   Medication Order Taking? Sig Documenting Provider Last Dose Status Informant  albuterol (VENTOLIN HFA) 108 (90 Base) MCG/ACT inhaler [id number] Yes Inhale 1-2 puffs into the lungs every 6 (six) hours as needed for shortness of breath or wheezing. Eden Emms, NP Taking Active   amitriptyline (ELAVIL) 25 MG tablet [id number] Yes TAKE ONE TABLET BY MOUTH EVERYDAY AT BEDTIME Needs appointment for further refills Penumalli, Glenford Bayley, MD Taking Active   atenolol (TENORMIN) 25 MG tablet [id number] Yes Take 1 tablet (25 mg total) by mouth daily. Eden Emms, NP Taking Active   Continuous Blood Gluc Receiver (DEXCOM G7 RECEIVER) DEVI [id number] Yes 1 each by Does not apply route daily. DX E11.22 Eden Emms, NP Taking Active   Continuous Blood Gluc Sensor (DEXCOM G7 SENSOR) MISC [id number] Yes Apply sensor every 10  days as directed Gweneth Dimitri, MD Taking Active   Dulaglutide (TRULICITY) 1.5 MG/0.5ML SOPN [id number] Yes Inject 1.5 mg into the skin once a week. Eden Emms, NP Taking Active   empagliflozin (JARDIANCE) 25 MG TABS tablet [id number]  Yes Take 1 tablet (25 mg total) by mouth daily before breakfast. Eden Emms, NP Taking Active   ibuprofen (ADVIL) 800 MG tablet [id number] Yes Take 1 tablet (800 mg total) by mouth every 8 (eight) hours. Eden Emms, NP Taking Active   levothyroxine (SYNTHROID) 100 MCG tablet [id number] Yes Take 1 tablet (100 mcg total) by mouth daily before breakfast. Eden Emms, NP Taking Active   lisinopril (ZESTRIL) 10 MG tablet [id number] Yes Take 1 tablet (10 mg total) by mouth daily. Eden Emms, NP Taking Active   Melatonin 10 MG TABS [id number] Yes Take 10 mg by mouth at bedtime. [provider] Taking Active Self  omeprazole (PRILOSEC) 40 MG capsule [id number] Yes Take 1 capsule (40 mg total) by mouth 2 (two) times daily. Eden Emms, NP Taking Active   pregabalin (LYRICA) 75 MG capsule [id number] Yes TAKE ONE CAPSULE BY MOUTH TWICE DAILY Eden Emms, NP Taking Active   rosuvastatin (CRESTOR) 20 MG tablet [id number] Yes TAKE ONE TABLET BY MOUTH EVERYDAY AT BEDTIME Eden Emms, NP Taking Active   Tiotropium Bromide Monohydrate 2.5 MCG/ACT AERS [id number] Yes Inhale 2 puffs into the lungs daily. Eden Emms, NP Taking Active             SDOH:  (Social Determinants of Health) assessments and interventions performed: No SDOH Interventions    Flowsheet Row Office Visit from 10/17/2021 in [facility] Chronic Care Management from 09/17/2021 in [facility] at [facility] Chronic Care Management from 10/31/2020 in [facility] at [facility] Visit from 05/07/2020 in [facility] [place]  SDOH Interventions      Food Insecurity Interventions --  Intervention Not Indicated -- --  Depression Interventions/Treatment  Medication -- -- Medication  Financial Strain Interventions -- -- Intervention Not Indicated --       Medication Assistance: None required.  Patient affirms current coverage meets needs.  Medication Access: Within the past 30 days, how often has patient missed a dose of medication? 0 Is a pillbox or other method used to improve adherence? Yes  Factors that may affect medication adherence? adverse effects of medications Are meds synced by current pharmacy? Yes  Are meds delivered by current pharmacy? Yes  Does patient experience delays in picking up medications due to transportation concerns? No   [facility] Reviewed: Is patient disadvantaged to use [facility]?: No  Current Rx insurance plan: [employer] [employer] [facility] Name and location of Current pharmacy:  [facility] - [place], [place] - [address] [place] [address] Phone: [phone] Fax: [phone]  [id number] - [place], [place] - [address] AT [facility] [address] [place] [address] Phone: [phone] Fax: [phone]  [facility] services reviewed with patient today?: Yes  30-day vials: Delivery 09/03/22 Jardiance 25mg  -take 1 tablet at breakfast  Lisinopril 10mg - take 1 tablet at breakfast Omeprazole 40mg - take 1 tablet breakfast, take 1 tablet bedtime  Pregabalin 75mg -take 1 tablet breakfast,take 1 tablet bedtime  Rosuvastatin 20mg  -take 1 tablet bedtime Amitriptyline 25mg . Take 1 tablet bedtime  Levothyroxine 156mcg-take 1 tablet before breakfast Atenolol 25mg -take 1 tablet daily Ibuprofen 800mg  -take 1 tablet every 8 hours as needed Dexcom G7   Compliance/Adherence/Medication fill history: Care Gaps: Lung cancer screening (never done) Foot exam (due 09/2021) DEXA (never done) Eye exam (due 07/2022) - [facility]  Star-Rating Drugs: Trulicity  - PDC 98% Jardiance - PDC 100% Lisinopril - PDC 100% Rosuvastatin - PDC 100%   ASSESSMENT / PLAN  Hypertension (BP goal <140/90) -Controlled - per clinic readings  -Home BP readings: none available -Current treatment: Atenolol 25 mg - 1 tablet daily (primarily for tremor) - Appropriate, Effective, Safe, Accessible Lisinopril 10 mg daily - Appropriate, Effective, Safe, Accessible -Medications previously tried: propranolol - worsened COPD -Current exercise habits: walks her dog a lot and grandchildren keep her active -Denies hypotensive/hypertensive symptoms -Recommended to continue current medication  Hyperlipidemia: (LDL goal < 70) -Controlled - LDL 59 (01/2022) at goal; pt endorses compliance with statin -Current treatment: Rosuvastatin 20 mg daily HS - Appropriate, Effective, Safe, Accessible -Medications previously tried: n/a  -Educated on Cholesterol goals; Benefits of statin for ASCVD risk reduction; -Recommended to continue current medication  Diabetes (A1c goal <7%) Uncontrolled- A1c 15.0% (10/2022) worsened significantly from 8.0% in Jan; Trulicity titration inhibited by national backorder -Current home glucose readings: low 200s (was > 400 without Trulicity) -Current medications: Jardiance 25 mg daily (AM) - Appropriate, Query Effective Trulicity 1.5 mg weekly (Wed) Appropriate, Query Effective Glipizide 5 mg BID Dexcom G7 w/ reader - Appropriate, Effective, Safe, Accessible -Medications previously tried: metformin (n/v), Actos, Rybelsus (N/V), Lantus -Recommended to continue current medication; increase Trulicity to 3 mg weekly next month  COPD (Goal: control symptoms and prevent exacerbations) -Controlled - per pt no issues with breathing/SOB recently; -Exacerbations requiring treatment in last 6 months: 0 -Current treatment  Spiriva Respimat 2.5 mg 2 puffs daily - Appropriate, Effective, Safe, Accessible Albuterol HFA- Appropriate, Effective, Safe,  Accessible -Medications previously tried: n/a  -Patient reports consistent use of maintenance inhaler -Frequency of rescue inhaler use: PRN -Counseled on Benefits of consistent maintenance inhaler use; When to use rescue inhaler -Recommended to continue current medication  Tobacco use (Goal: cessation) -Improving - pt reports she is down to 3 cigarettes a day since starting Chantix -Previous quit attempts: quit during pregnancies; tried Chantix 2023  Hypothyroidism (Goal TSH: 0.4-4.5) -Controlled - TSH 0.46 (06/2022) approaching hyperthyroid levels; pt c/o very brittle nails which has been associated with hyperthyroidism; however, pt has complained of brittle nails for months even when TSH was closer to middle of range (1.65 in 07/2021) -Current treatment: Levothyroxine 100 mcg daily - Appropriate, Effective, Safe, Accessible -Counseled to take medication n/a -Recommended to continue current medication  Health Maintenance -Hx GERD: on omeprazole 40 mg -Neuropathy: on amitriptylyine 25 mg, Lyrica 75 mg    Al Corpus, PharmD, [place], CPP Clinical Pharmacist Practitioner [facility] at [facility] [phone]

## 2022-11-02 ENCOUNTER — Other Ambulatory Visit: Payer: Self-pay | Admitting: Nurse Practitioner

## 2022-11-02 DIAGNOSIS — G629 Polyneuropathy, unspecified: Secondary | ICD-10-CM

## 2022-11-02 MED ORDER — PREGABALIN 75 MG PO CAPS
75.0000 mg | ORAL_CAPSULE | Freq: Two times a day (BID) | ORAL | 2 refills | Status: DC
Start: 2022-11-02 — End: 2023-01-09

## 2022-11-24 ENCOUNTER — Telehealth: Payer: Self-pay | Admitting: Nurse Practitioner

## 2022-11-24 NOTE — Telephone Encounter (Signed)
Called the pharmacy and they said they would send out tomorrow to pt.

## 2022-11-24 NOTE — Telephone Encounter (Signed)
Patient would like for glipiZIDE (GLUCOTROL) 5 MG tablet to be sent into [facility] - [place], [place] - [place] [id number]   Thank you,  Judeth Cornfield,  AMB Clinical Support [facility] AWV Program Direct Dial ??[phone]

## 2022-11-24 NOTE — Telephone Encounter (Signed)
She needs to contact the pharmacy as I sent It in at the beginning of May with 3 refills. She should not be out

## 2022-11-25 ENCOUNTER — Other Ambulatory Visit: Payer: Self-pay | Admitting: Nurse Practitioner

## 2022-11-25 NOTE — Telephone Encounter (Signed)
Can we call and see if the pharmacy has the 3mg  Trulicity dosing please

## 2022-11-26 ENCOUNTER — Other Ambulatory Visit: Payer: Self-pay | Admitting: Diagnostic Neuroimaging

## 2022-11-28 ENCOUNTER — Other Ambulatory Visit: Payer: Self-pay | Admitting: Diagnostic Neuroimaging

## 2022-12-14 ENCOUNTER — Other Ambulatory Visit: Payer: Self-pay | Admitting: Nurse Practitioner

## 2022-12-15 NOTE — Telephone Encounter (Signed)
Called pharmacy x 2 sent to voice mail both times after being on hold several minutes.

## 2022-12-15 NOTE — Telephone Encounter (Signed)
Can we see if the pharmacy has the 3mg  dose of trulicity

## 2022-12-18 ENCOUNTER — Other Ambulatory Visit: Payer: Self-pay | Admitting: Nurse Practitioner

## 2022-12-18 DIAGNOSIS — R251 Tremor, unspecified: Secondary | ICD-10-CM

## 2022-12-19 ENCOUNTER — Encounter: Payer: 59 | Admitting: Pharmacist

## 2022-12-25 ENCOUNTER — Other Ambulatory Visit: Payer: Self-pay | Admitting: Diagnostic Neuroimaging

## 2022-12-29 ENCOUNTER — Other Ambulatory Visit: Payer: Self-pay | Admitting: Diagnostic Neuroimaging

## 2022-12-29 NOTE — Telephone Encounter (Signed)
Looks like patient was able to get the 3mg  dose is that right?

## 2022-12-30 NOTE — Telephone Encounter (Signed)
Can we call and verify with either the pharmacy or the patient that she does have the 3 mg

## 2022-12-31 NOTE — Telephone Encounter (Signed)
Called and confirmed pt does have 3 mg dosage and will not need another refill for another 4 weeks.

## 2023-01-09 ENCOUNTER — Encounter: Payer: Self-pay | Admitting: Nurse Practitioner

## 2023-01-09 ENCOUNTER — Ambulatory Visit: Payer: 59 | Admitting: Nurse Practitioner

## 2023-01-09 VITALS — BP 100/80 | HR 67 | Temp 97.7°F | Ht 67.0 in | Wt 228.2 lb

## 2023-01-09 DIAGNOSIS — Z7984 Long term (current) use of oral hypoglycemic drugs: Secondary | ICD-10-CM

## 2023-01-09 DIAGNOSIS — Z7985 Long-term (current) use of injectable non-insulin antidiabetic drugs: Secondary | ICD-10-CM

## 2023-01-09 DIAGNOSIS — E1165 Type 2 diabetes mellitus with hyperglycemia: Secondary | ICD-10-CM | POA: Diagnosis not present

## 2023-01-09 DIAGNOSIS — G629 Polyneuropathy, unspecified: Secondary | ICD-10-CM

## 2023-01-09 LAB — POCT GLYCOSYLATED HEMOGLOBIN (HGB A1C): Hemoglobin A1C: 8.8 % — AB (ref 4.0–5.6)

## 2023-01-09 MED ORDER — GLIPIZIDE 5 MG PO TABS: 5.0000 mg | ORAL_TABLET | Freq: Every day | ORAL | 2 refills | Status: DC

## 2023-01-09 MED ORDER — PREGABALIN 75 MG PO CAPS
ORAL_CAPSULE | ORAL | 2 refills | Status: DC
Start: 2023-01-09 — End: 2023-04-10

## 2023-01-09 NOTE — Progress Notes (Signed)
Established Patient Office Visit  Subjective   Patient ID: Valerie Lamb, female    DOB: 10-Jan-1956  Age: 68 y.o. MRN: [id number]  Chief Complaint  Patient presents with   Diabetes    Pt states she's been feeling achy. Pt states that the gabapentin is not working. Still waking up every night with lightning pain in legs and arms.       DM2: patient uses descom and was on trulicity 3mg  and had to switch to 1.5 and now back to the 3mg . Last A1c was 15.0. we did add on glipizde and she is here for a follow up. States that she was on trulcity 3 mg for a month now.  She has been taking the glipizide medication twice a day and tolerating it well.  Checks her glucoses have been in the 140s to 150s consistently  Hypo glycemia is once a day at 60.  Patient states that she is symptomatic we will check her glucose and and have some juice and it will bounce back up to the mid 80s to 90s.  Patient does have the compalint of feelig achy and getting woke up with lightening pains in her arms and legs.  Patient is currently on pregabalin 75 mg twice daily and amitriptyline 25 mg.    Review of Systems  Constitutional:  Negative for chills and fever.  Respiratory:  Negative for shortness of breath.   Cardiovascular:  Negative for chest pain.  Neurological:  Positive for tingling. Negative for headaches.      Objective:     BP 100/80   Pulse 67   Temp 97.7 F (36.5 C) (Temporal)   Ht 5\' 7"  (1.702 m)   Wt 228 lb 3.2 oz (103.5 kg)   SpO2 93%   BMI 35.74 kg/m    Physical Exam Vitals and nursing note reviewed.  Constitutional:      Appearance: Normal appearance.  Cardiovascular:     Rate and Rhythm: Normal rate and regular rhythm.     Heart sounds: Normal heart sounds.  Pulmonary:     Effort: Pulmonary effort is normal.     Breath sounds: Normal breath sounds.  Neurological:     Mental Status: She is alert.      Results for orders placed or performed in visit on 01/09/23  POCT  glycosylated hemoglobin (Hb A1C)  Result Value Ref Range   Hemoglobin A1C 8.8 (A) 4.0 - 5.6 %   HbA1c POC (<> result, manual entry)     HbA1c, POC (prediabetic range)     HbA1c, POC (controlled diabetic range)        The ASCVD Risk score (Arnett DK, et al., 2019) failed to calculate for the following reasons:   Unable to determine if patient is Non-Hispanic African American    Assessment & Plan:   Problem List Items Addressed This Visit       Endocrine   Uncontrolled type 2 diabetes mellitus with hyperglycemia (HCC) - Primary    Patient was finally able to get Trulicity 3 mg has been using it almost a month dramatic decrease in A1c from 15-8.8.  Patient is experiencing some hypoglycemia through the day we will decrease glipizide from 5 mg twice daily to 5 mg daily in the morning.  Continue monitoring glucoses      Relevant Medications   glipiZIDE (GLUCOTROL) 5 MG tablet   Other Relevant Orders   POCT glycosylated hemoglobin (Hb A1C) (Completed)     Nervous and Auditory  Neuropathy    Patient currently on amitriptyline 25 mg nightly.  And pregabalin.  Will increase pregabalin to 75 mg every morning and 150 mg every afternoon prescription sent in today      Relevant Medications   pregabalin (LYRICA) 75 MG capsule    Return in about 3 months (around 04/11/2023) for DM recheck.    Audria Nine, NP

## 2023-01-09 NOTE — Assessment & Plan Note (Signed)
Patient currently on amitriptyline 25 mg nightly.  And pregabalin.  Will increase pregabalin to 75 mg every morning and 150 mg every afternoon prescription sent in today

## 2023-01-09 NOTE — Assessment & Plan Note (Signed)
Patient was finally able to get Trulicity 3 mg has been using it almost a month dramatic decrease in A1c from 15-8.8.  Patient is experiencing some hypoglycemia through the day we will decrease glipizide from 5 mg twice daily to 5 mg daily in the morning.  Continue monitoring glucoses

## 2023-01-09 NOTE — Patient Instructions (Signed)
Nice to see you today I want you to take 1 glipizide a day vs 2 You will take 1 pregablin in the morning and 2 in the evening I have updated the prescriptions at the pharmacy  Follow up with me in 3 months, sooner if you need me

## 2023-01-18 ENCOUNTER — Other Ambulatory Visit: Payer: Self-pay | Admitting: Nurse Practitioner

## 2023-01-18 DIAGNOSIS — G629 Polyneuropathy, unspecified: Secondary | ICD-10-CM

## 2023-01-18 DIAGNOSIS — E1159 Type 2 diabetes mellitus with other circulatory complications: Secondary | ICD-10-CM

## 2023-01-18 DIAGNOSIS — E1165 Type 2 diabetes mellitus with hyperglycemia: Secondary | ICD-10-CM

## 2023-01-29 ENCOUNTER — Encounter: Payer: Self-pay | Admitting: Nurse Practitioner

## 2023-01-29 DIAGNOSIS — G8929 Other chronic pain: Secondary | ICD-10-CM

## 2023-01-29 MED ORDER — IBUPROFEN 800 MG PO TABS
800.0000 mg | ORAL_TABLET | Freq: Three times a day (TID) | ORAL | 0 refills | Status: DC | PRN
Start: 2023-01-29 — End: 2023-03-19

## 2023-01-30 ENCOUNTER — Other Ambulatory Visit: Payer: Self-pay | Admitting: Nurse Practitioner

## 2023-01-30 ENCOUNTER — Other Ambulatory Visit: Payer: Self-pay | Admitting: Diagnostic Neuroimaging

## 2023-01-30 DIAGNOSIS — I152 Hypertension secondary to endocrine disorders: Secondary | ICD-10-CM

## 2023-02-06 ENCOUNTER — Other Ambulatory Visit: Payer: Self-pay | Admitting: Nurse Practitioner

## 2023-02-06 ENCOUNTER — Other Ambulatory Visit: Payer: Self-pay | Admitting: Diagnostic Neuroimaging

## 2023-02-06 DIAGNOSIS — Z794 Long term (current) use of insulin: Secondary | ICD-10-CM

## 2023-02-06 DIAGNOSIS — G8929 Other chronic pain: Secondary | ICD-10-CM

## 2023-02-10 ENCOUNTER — Encounter: Payer: Self-pay | Admitting: Nurse Practitioner

## 2023-02-10 ENCOUNTER — Telehealth: Payer: Self-pay | Admitting: Nurse Practitioner

## 2023-02-10 NOTE — Telephone Encounter (Signed)
Contacted Valerie Lamb to schedule their annual wellness visit. Patient declined to schedule AWV at this time.Transferred care to [facility]  [facility] Guide [facility] AWV TEAM Direct Dial: [phone]
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
## 2023-02-13 ENCOUNTER — Encounter: Payer: Self-pay | Admitting: Nurse Practitioner

## 2023-02-13 DIAGNOSIS — E1159 Type 2 diabetes mellitus with other circulatory complications: Secondary | ICD-10-CM

## 2023-02-13 MED ORDER — LISINOPRIL 10 MG PO TABS: 10.0000 mg | ORAL_TABLET | Freq: Every day | ORAL | 1 refills | Status: DC

## 2023-02-13 NOTE — Addendum Note (Signed)
Addended by: Eden Emms on: 02/13/2023 04:42 PM   Modules accepted: Orders

## 2023-02-16 ENCOUNTER — Encounter: Payer: Self-pay | Admitting: Nurse Practitioner

## 2023-02-18 ENCOUNTER — Other Ambulatory Visit: Payer: 59 | Admitting: Pharmacist

## 2023-02-18 DIAGNOSIS — E1122 Type 2 diabetes mellitus with diabetic chronic kidney disease: Secondary | ICD-10-CM

## 2023-02-18 MED ORDER — TRULICITY 3 MG/0.5ML ~~LOC~~ SOAJ
3.0000 mg | SUBCUTANEOUS | 5 refills | Status: DC
Start: 2023-02-18 — End: 2023-08-06

## 2023-02-18 NOTE — Patient Instructions (Addendum)
[name],   It was a pleasure to see you today! As we discussed:?  Your blood sugar readings looked great today! I am glad you are no longer having low blood sugars since decreasing your glipizide. We discussed continuing your medications without changes today.  Continue Trulicity 3 mg under the skin once weekly   Continue glipizide 5 mg once daily ~30 minutes before breakfast  If you begin having low blood sugars again (less than 70 mg/dL) you may stop taking glipizide altogether.    Follow up with clinical pharmacist via telephone in around 4 weeks. Scheduled 03/25/23 at 9:00 AM.   Berenice Primas, PharmD - Clinical Pharmacist

## 2023-02-18 NOTE — Progress Notes (Signed)
02/18/2023 Name: Valerie Lamb MRN: [id number] DOB: 10-Aug-1955  Subjective  Chief Complaint  Patient presents with   Diabetes   Hyperlipidemia    Reason for visit: Valerie Lamb is a 67 y.o. year old female who presented for a telephone visit.   They were referred to the pharmacist by their PCP for assistance in managing diabetes.   Care Team: Primary Care Provider: Eden Emms, NP ; Next Scheduled Visit: Not scheduled  Reason for visit: ?  Valerie Lamb is a 67 y.o. female with a history of diabetes (type 2), who presents today for a diabetes pharmacotherapy visit.? PMH also significant for HTN, GERD, HLD, tobacco use, COPD, neuropathy.   Known DM Complications: peripheral neuropathy    Date of Last Diabetes Related Visit: 01/09/23 with PCP  Action At Last Diabetes Related Visit: ?  Decrease glipizide from 5 mg BID to once daily due to hypoglycemia; Increase evening dose of Lyrica (now 75 mg AM/150 mg PM)   Since Last visit / History of Present Illness: ?  Patient reports implementing plan from last visit. Denies adverse effects with titration of Lyrica though does not feel much benefit yet. Tolerating Trulicity 3 mg well without adverse effects. Has 1 pen left at home which she will take today. Requests refills sent to [facility] in [facility] due to [facility] closing.    Reported DM Regimen: ?  Glipizide 5 mg once daily before breakfast Trulicity 3 mg sq once weekly (increased back to 3 mg >4 weeks ago) Jardiance 25 mg daily    DM medications tried in the past:?  Glipizide 5 mg BID (frequency decreased 01/09/23 due to hypoglycemia) Rybelsus (oral semaglutide): Diarrhea Metformin: Nausea Lantus (2022)  Reported Diet: Patient reports that her appetite comes and goes, though she typically eats regular meals every day due to family cooking for her daily.   SMBG  Dexcom G7 : ?Uses Dexcom Reader (smart phone does not support G7 app) 14-Day Report ABG 141 mg/dL; TIR 78%, high 46%,  very high 1%, low 0%, very low 0%  Overall, patient thinks that blood sugars are  improved  since last diabetes related visit, with no report of BG <70 mg/dL.    Hypo/Hyperglycemia: ?  Symptoms of hypoglycemia since last visit:? no  If yes, it was treated by: n/a  Symptoms of hyperglycemia since last visit:? no   DM Prevention:  Statin: Taking; high intensity; Rosuvastatin 20 mg daily Aspirin - not indicated ?  ACEI/ARB - Taking; Urine MA/CR Ratio - normal. <0.7 (02/17/22) Last eye exam: Due - Has appointment in November Last foot exam: 10/24/2020 Due Tobacco Use: 8-10 cigarettes per day (is trying to cut back but not interested in quitting at this time)  Cardiovascular Risk Reduction History of clinical ASCVD? no The ASCVD Risk score (Arnett DK, et al., 2019) failed to calculate for the following reasons:   Unable to determine if patient is Non-Hispanic African American History of heart failure? no History of hyperlipidemia? yes Current BMI: 35.7 mg/m2 (Ht 5'7", Wt 103.5 kg) Taking statin? yes; high intensity (rosuvastatin 20 mg daily) Taking SGLT-2i? yes Taking GLP- 1 RA? yes Taking ACEi/ARB? yes     ________________________________________________  Objective    Review of Systems:?  Constitutional:? No fever, chills or unintentional weight loss  Cardiovascular:? No chest pain or pressure, shortness of breath, dyspnea on exertion, orthopnea or LE edema  Pulmonary:? No cough or shortness of breath  GI:? No nausea, vomiting, constipation, diarrhea, abdominal pain, dyspepsia, change  in bowel habits  Endocrine:? No polyuria, polyphagia or blurred vision  Psych:? No depression, anxiety, insomnia    Physical Examination:  Vitals:  Wt Readings from Last 3 Encounters:  01/09/23 228 lb 3.2 oz (103.5 kg)  10/03/22 228 lb (103.4 kg)  09/03/22 239 lb (108.4 kg)   BP Readings from Last 3 Encounters:  01/09/23 100/80  10/03/22 116/64  06/09/22 122/78   Pulse Readings from Last 3  Encounters:  01/09/23 67  10/03/22 72  06/09/22 (!) 55   The ASCVD Risk score (Arnett DK, et al., 2019) failed to calculate for the following reasons:   Unable to determine if patient is Non-Hispanic African American    Labs:?  Lab Results  Component Value Date   HGBA1C 8.8 (A) 01/09/2023   HGBA1C 15.0 (A) 10/03/2022   HGBA1C 8.0 (A) 06/09/2022   GLUCOSE 240 (H) 10/03/2022   GLUCOSE 92 02/17/2022   GLUCOSE 182 (H) 08/09/2021   MICRALBCREAT 0.9 02/17/2022   MICRALBCREAT 2.8 10/24/2020   CREATININE 0.86 10/03/2022   CREATININE 0.91 02/17/2022   CREATININE 0.85 08/09/2021   GFR 70.80 10/03/2022   GFR 66.45 02/17/2022   GFR 72.38 08/09/2021   Lab Results  Component Value Date   CHOL 133 02/17/2022   AST 57 (H) 10/03/2022   ALT 71 (H) 10/03/2022   Lab Results  Component Value Date   LDLCALC 59 02/17/2022     Chemistry      Component Value Date/Time   NA 138 10/03/2022 1023   K 3.5 10/03/2022 1023   CL 102 10/03/2022 1023   CO2 25 10/03/2022 1023   BUN 13 10/03/2022 1023   CREATININE 0.86 10/03/2022 1023      Component Value Date/Time   CALCIUM 9.0 10/03/2022 1023   ALKPHOS 121 (H) 10/03/2022 1023   AST 57 (H) 10/03/2022 1023   ALT 71 (H) 10/03/2022 1023   BILITOT 0.6 10/03/2022 1023       Assessment and Plan:   1. Diabetes, type 2: Improved control per last A1c of 8.8% (01/09/23) improved from 15% (10/03/22) though still above goal <7% without hypoglycemia. CGM report shows excellent control with ABG 141 mg/dL; TIR 16%, high 10%, very high 1% over the past 14 days.  Current Regimen: Jardiance 25 mg daily, Glipizide 5 mg daily, Trulicity 3 mg weekly  Exercise: Walking/playing with dog  Continue medications today without changes If hypoglycemia, patient verbalizes understanding to discontinue glipizide Reviewed signs/symptoms/treatment of hypoglycemia  Next A1c due ~04/11/23 Eye exam overdue - Scheduled in November Foot exam overdue - Recommend at next in-person  visit Future Consideration: GLP1-RA: Consider transition to [facility] per greater A1c- and weight-lowering propensity (per previous report of intolerance to Ozempic) with goal of stopping glipizide. Patient preference to remain on Trulicity though she is open to switch if significant backorder occurs again with 3 mg Trulicity Metformin: Reported intolerance (nausea/vomiting); Reasonable to try XR formulation SU: Ideally with optimization of GLP1 therapy, long-term goal to discontinue glipizide to reduce risk of hypoglycemia.     2. HTN: Controlled based on last clinic BP 100/80 mmHg, goal <130/80 mmHg. Denies lightheadedness/dizziness. Current Regimen: lisinopril 10 mg daily Continue medications without changes Future Consideration: If lower pressures begin to cause symptoms, reasonable to halve dose of lisinopril to 5 mg daily though would not discontinue due to renal protective benefits   3. ASCVD (primary prevention): Controlled - Last lipid panel on 02/17/22 with LDL of 59 mg/dL indicates patient is below goal of <70 mg/dL  on high-intensity statin for primary prevention of ASCVD.  Key risk factors include: diabetes, hypertension (controlled), hyperlipidemia (controlled), current smoker, BMI >30 kg/m2 Current Regimen: rosuvastatin 20 mg daily  Continue medications today without changes    Follow Up Follow up with clinical pharmacist via telephone in 4 weeks.  ?  Follow up with Eden Emms, NP not scheduled?  Future Appointments  Date Time Provider Department Center  03/25/2023  9:00 AM [facility] CCM PHARMACIST [facility]  09/01/2023  2:00 PM Willeen Niece, MD [facility] None    Loree Fee, PharmD Clinical Pharmacist [facility] [phone]

## 2023-03-09 ENCOUNTER — Encounter: Payer: Self-pay | Admitting: Nurse Practitioner

## 2023-03-15 ENCOUNTER — Encounter: Payer: Self-pay | Admitting: Nurse Practitioner

## 2023-03-16 NOTE — Telephone Encounter (Signed)
See other mychart encounter.

## 2023-03-18 ENCOUNTER — Encounter: Payer: Self-pay | Admitting: Nurse Practitioner

## 2023-03-18 DIAGNOSIS — G8929 Other chronic pain: Secondary | ICD-10-CM

## 2023-03-18 NOTE — Telephone Encounter (Signed)
LVM for patient to rtn my call to discuss.

## 2023-03-19 MED ORDER — IBUPROFEN 800 MG PO TABS
800.0000 mg | ORAL_TABLET | Freq: Three times a day (TID) | ORAL | 1 refills | Status: DC | PRN
Start: 2023-03-19 — End: 2023-06-09

## 2023-03-25 ENCOUNTER — Telehealth: Payer: Self-pay | Admitting: Nurse Practitioner

## 2023-03-25 ENCOUNTER — Other Ambulatory Visit: Payer: 59

## 2023-03-25 DIAGNOSIS — E1122 Type 2 diabetes mellitus with diabetic chronic kidney disease: Secondary | ICD-10-CM

## 2023-03-25 NOTE — Telephone Encounter (Signed)
Prescription Request  03/25/2023  LOV: 01/09/2023  What is the name of the medication or equipment? Continuous Blood Gluc Receiver (DEXCOM G7 RECEIVER) DEVI   Have you contacted your pharmacy to request a refill? Yes   Which pharmacy would you like this sent to?  [facility] [address] [place], [place] - [address] [address] [place] [address] Phone: [phone] Fax: [phone]    Patient notified that their request is being sent to the clinical staff for review and that they should receive a response within 2 business days.   Please advise at Mobile There is no such number on file (mobile).[phone]

## 2023-03-26 ENCOUNTER — Encounter: Payer: Self-pay | Admitting: Nurse Practitioner

## 2023-03-26 DIAGNOSIS — E1165 Type 2 diabetes mellitus with hyperglycemia: Secondary | ICD-10-CM

## 2023-03-26 MED ORDER — DEXCOM G7 SENSOR MISC: 5 refills | Status: DC

## 2023-03-26 MED ORDER — EMPAGLIFLOZIN 25 MG PO TABS: 25.0000 mg | ORAL_TABLET | Freq: Every day | ORAL | 1 refills | Status: DC

## 2023-03-26 MED ORDER — GLIPIZIDE 5 MG PO TABS: 5.0000 mg | ORAL_TABLET | Freq: Every day | ORAL | 1 refills | Status: DC

## 2023-03-26 NOTE — Telephone Encounter (Signed)
Refill provided

## 2023-03-26 NOTE — Telephone Encounter (Signed)
LAST APPOINTMENT DATE: 01/09/2023   NEXT APPOINTMENT DATE: 04/10/2023  Dexcom G7 Receiver   LAST REFILL:06/09/2022  QTY: #1each  2RF

## 2023-04-10 ENCOUNTER — Encounter: Payer: Self-pay | Admitting: *Deleted

## 2023-04-10 ENCOUNTER — Ambulatory Visit (INDEPENDENT_AMBULATORY_CARE_PROVIDER_SITE_OTHER): Payer: 59 | Admitting: Nurse Practitioner

## 2023-04-10 ENCOUNTER — Ambulatory Visit (INDEPENDENT_AMBULATORY_CARE_PROVIDER_SITE_OTHER)
Admission: RE | Admit: 2023-04-10 | Discharge: 2023-04-10 | Disposition: A | Discharge status: Home / Self Care | Payer: 59 | Source: Ambulatory Visit | Attending: Nurse Practitioner | Admitting: Nurse Practitioner

## 2023-04-10 ENCOUNTER — Encounter: Payer: Self-pay | Admitting: Nurse Practitioner

## 2023-04-10 VITALS — BP 108/72 | HR 69 | Temp 98.2°F | Ht 67.0 in | Wt 225.0 lb

## 2023-04-10 DIAGNOSIS — M549 Dorsalgia, unspecified: Secondary | ICD-10-CM

## 2023-04-10 DIAGNOSIS — E039 Hypothyroidism, unspecified: Secondary | ICD-10-CM | POA: Diagnosis not present

## 2023-04-10 DIAGNOSIS — Z794 Long term (current) use of insulin: Secondary | ICD-10-CM

## 2023-04-10 DIAGNOSIS — M25531 Pain in right wrist: Secondary | ICD-10-CM | POA: Diagnosis not present

## 2023-04-10 DIAGNOSIS — T7840XA Allergy, unspecified, initial encounter: Secondary | ICD-10-CM | POA: Diagnosis not present

## 2023-04-10 DIAGNOSIS — E1165 Type 2 diabetes mellitus with hyperglycemia: Secondary | ICD-10-CM | POA: Diagnosis not present

## 2023-04-10 DIAGNOSIS — G8929 Other chronic pain: Secondary | ICD-10-CM

## 2023-04-10 DIAGNOSIS — G629 Polyneuropathy, unspecified: Secondary | ICD-10-CM

## 2023-04-10 LAB — MICROALBUMIN / CREATININE URINE RATIO
Creatinine,U: 51.8 mg/dL
Microalb Creat Ratio: 1.4 mg/g (ref 0.0–30.0)
Microalb, Ur: <0.7 mg/dL (ref 0.0–1.9)

## 2023-04-10 LAB — COMPREHENSIVE METABOLIC PANEL
ALT: 30 U/L (ref 0–35)
AST: 26 U/L (ref 0–37)
Albumin: 4.2 g/dL (ref 3.5–5.2)
Alkaline Phosphatase: 81 U/L (ref 39–117)
BUN: 17 mg/dL (ref 6–23)
CO2: 28 meq/L (ref 19–32)
Calcium: 9.8 mg/dL (ref 8.4–10.5)
Chloride: 105 meq/L (ref 96–112)
Creatinine, Ser: 0.86 mg/dL (ref 0.40–1.20)
GFR: 70.54 mL/min (ref >60.00)
Glucose, Bld: 136 mg/dL — ABNORMAL HIGH (ref 70–99)
Potassium: 3.8 meq/L (ref 3.5–5.1)
Sodium: 140 meq/L (ref 135–145)
Total Bilirubin: 0.5 mg/dL (ref 0.2–1.2)
Total Protein: 7.1 g/dL (ref 6.0–8.3)

## 2023-04-10 LAB — CBC
HCT: 51.3 % — ABNORMAL HIGH (ref 36.0–46.0)
Hemoglobin: 17.2 g/dL — ABNORMAL HIGH (ref 12.0–15.0)
MCHC: 33.5 g/dL (ref 30.0–36.0)
MCV: 91 fL (ref 78.0–100.0)
Platelets: 151 10*3/uL (ref 150.0–400.0)
RBC: 5.64 Mil/uL — ABNORMAL HIGH (ref 3.87–5.11)
RDW: 15.2 % (ref 11.5–15.5)
WBC: 6.4 10*3/uL (ref 4.0–10.5)

## 2023-04-10 LAB — POCT GLYCOSYLATED HEMOGLOBIN (HGB A1C): Hemoglobin A1C: 7.3 % — AB (ref 4.0–5.6)

## 2023-04-10 MED ORDER — LEVOTHYROXINE SODIUM 100 MCG PO TABS: 100.0000 ug | ORAL_TABLET | Freq: Every day | ORAL | 1 refills | Status: DC

## 2023-04-10 MED ORDER — GLIPIZIDE ER 5 MG PO TB24: 5.0000 mg | ORAL_TABLET | Freq: Every day | ORAL | 1 refills | Status: DC

## 2023-04-10 MED ORDER — LEVOCETIRIZINE DIHYDROCHLORIDE 5 MG PO TABS: 5.0000 mg | ORAL_TABLET | Freq: Every evening | ORAL | 0 refills | Status: DC

## 2023-04-10 MED ORDER — MONTELUKAST SODIUM 10 MG PO TABS: 10.0000 mg | ORAL_TABLET | Freq: Every day | ORAL | 3 refills | Status: DC

## 2023-04-10 MED ORDER — PREGABALIN 75 MG PO CAPS
75.0000 mg | ORAL_CAPSULE | Freq: Two times a day (BID) | ORAL | Status: DC
Start: 2023-04-10 — End: 2023-10-09

## 2023-04-10 NOTE — Assessment & Plan Note (Signed)
Status post fall a month ago.  Bilateral side wrist pain pending x-ray
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
## 2023-04-10 NOTE — Assessment & Plan Note (Signed)
Has tried several second-generation antihistamines.  Will try levocetirizine and Singulair

## 2023-04-10 NOTE — Assessment & Plan Note (Signed)
Patient's A1c still trending down.  Currently on Trulicity 3 mg and glipizide IR 5 mg daily.  Patient still having some episodes of hypoglycemia but not super frequent.  She does have a CGM.  Will switch glipizide from IR to XL.  Continue Trulicity as is follow-up 3 months for A1c recheck

## 2023-04-10 NOTE — Patient Instructions (Signed)
Nice to see you today I will be in touch with the labs once I have them Follow up with me in 3 months, sooner if you need me 

## 2023-04-10 NOTE — Assessment & Plan Note (Signed)
History of the same.  Patient is to have injection of the back in the past.  Is starting to bother her again and she is interested in injections.  Ambulatory referral to Ortho placed today

## 2023-04-10 NOTE — Assessment & Plan Note (Signed)
We did try increasing Lyrica from 75 mg twice daily to 75 mg every morning and 150 mg every afternoon patient did not tolerate well.  Will switch back to 75 mg of Lyrica twice daily

## 2023-04-13 ENCOUNTER — Encounter: Payer: Self-pay | Admitting: Nurse Practitioner

## 2023-04-13 NOTE — Telephone Encounter (Signed)
Patient had wrist xray on 04/10/23

## 2023-04-14 ENCOUNTER — Encounter: Payer: Self-pay | Admitting: Nurse Practitioner

## 2023-04-14 DIAGNOSIS — E119 Type 2 diabetes mellitus without complications: Secondary | ICD-10-CM | POA: Diagnosis not present

## 2023-04-14 DIAGNOSIS — H2513 Age-related nuclear cataract, bilateral: Secondary | ICD-10-CM | POA: Diagnosis not present

## 2023-04-14 LAB — HM DIABETES EYE EXAM

## 2023-04-15 ENCOUNTER — Encounter: Payer: Self-pay | Admitting: Nurse Practitioner

## 2023-04-15 DIAGNOSIS — R251 Tremor, unspecified: Secondary | ICD-10-CM

## 2023-04-15 DIAGNOSIS — E1169 Type 2 diabetes mellitus with other specified complication: Secondary | ICD-10-CM

## 2023-04-16 MED ORDER — ROSUVASTATIN CALCIUM 20 MG PO TABS: 20.0000 mg | ORAL_TABLET | Freq: Every day | ORAL | 1 refills | Status: DC

## 2023-04-16 MED ORDER — ATENOLOL 25 MG PO TABS
25.0000 mg | ORAL_TABLET | Freq: Every day | ORAL | 1 refills | Status: DC
Start: 2023-04-16 — End: 2023-10-21

## 2023-04-17 ENCOUNTER — Ambulatory Visit: Payer: 59 | Admitting: Nurse Practitioner

## 2023-05-05 ENCOUNTER — Encounter: Payer: Self-pay | Admitting: Nurse Practitioner

## 2023-05-05 DIAGNOSIS — T7840XA Allergy, unspecified, initial encounter: Secondary | ICD-10-CM

## 2023-05-05 NOTE — Telephone Encounter (Signed)
Care team updated. HIM abstracted outside results. HM gap closed.

## 2023-05-06 MED ORDER — MONTELUKAST SODIUM 10 MG PO TABS: 10.0000 mg | ORAL_TABLET | Freq: Every day | ORAL | 1 refills | Status: DC

## 2023-05-06 MED ORDER — LEVOCETIRIZINE DIHYDROCHLORIDE 5 MG PO TABS
5.0000 mg | ORAL_TABLET | Freq: Every evening | ORAL | 2 refills | Status: DC
Start: 2023-05-06 — End: 2024-04-19

## 2023-05-06 MED ORDER — OMEPRAZOLE 40 MG PO CPDR: 40.0000 mg | DELAYED_RELEASE_CAPSULE | Freq: Two times a day (BID) | ORAL | 1 refills | Status: DC

## 2023-05-14 ENCOUNTER — Telehealth: Payer: Self-pay

## 2023-05-14 NOTE — Telephone Encounter (Signed)
This patient is on  our open gaps list. Looks like in one message she was going to transfer to another office but she has been getting care here as well? Would you like me to call patient and verify that she would like to continue care with our office ?

## 2023-05-14 NOTE — Telephone Encounter (Signed)
As far as I know she was planning to stay with [place]. But if you need to call and verify that is fine

## 2023-05-18 NOTE — Telephone Encounter (Signed)
Spoke to pt, pt states she doesn't recall discussing switching her care to another provider. Pt states she usually only sees [facility] every 3 months. Pt states she loves [facility] & our office, doesn't plan on leaving. Call back # [phone].

## 2023-05-18 NOTE — Telephone Encounter (Signed)
Called patient reviewed all information and repeated back to me. Will call if any questions.  ? ?

## 2023-06-09 ENCOUNTER — Encounter: Payer: Self-pay | Admitting: Nurse Practitioner

## 2023-06-09 DIAGNOSIS — I152 Hypertension secondary to endocrine disorders: Secondary | ICD-10-CM

## 2023-06-09 DIAGNOSIS — G8929 Other chronic pain: Secondary | ICD-10-CM

## 2023-06-09 MED ORDER — IBUPROFEN 800 MG PO TABS: 800.0000 mg | ORAL_TABLET | Freq: Three times a day (TID) | ORAL | 1 refills | Status: DC | PRN

## 2023-06-09 MED ORDER — LISINOPRIL 10 MG PO TABS: 10.0000 mg | ORAL_TABLET | Freq: Every day | ORAL | 1 refills | Status: DC

## 2023-07-17 ENCOUNTER — Encounter: Payer: Self-pay | Admitting: Nurse Practitioner

## 2023-07-17 ENCOUNTER — Ambulatory Visit: Payer: 59 | Admitting: Nurse Practitioner

## 2023-07-17 VITALS — BP 106/72 | HR 70 | Temp 97.9°F | Ht 67.0 in | Wt 231.2 lb

## 2023-07-17 DIAGNOSIS — E1159 Type 2 diabetes mellitus with other circulatory complications: Secondary | ICD-10-CM

## 2023-07-17 DIAGNOSIS — Z13820 Encounter for screening for osteoporosis: Secondary | ICD-10-CM

## 2023-07-17 DIAGNOSIS — E1165 Type 2 diabetes mellitus with hyperglycemia: Secondary | ICD-10-CM | POA: Diagnosis not present

## 2023-07-17 DIAGNOSIS — G629 Polyneuropathy, unspecified: Secondary | ICD-10-CM

## 2023-07-17 DIAGNOSIS — Z0000 Encounter for general adult medical examination without abnormal findings: Secondary | ICD-10-CM | POA: Insufficient documentation

## 2023-07-17 DIAGNOSIS — Z794 Long term (current) use of insulin: Secondary | ICD-10-CM

## 2023-07-17 DIAGNOSIS — J449 Chronic obstructive pulmonary disease, unspecified: Secondary | ICD-10-CM

## 2023-07-17 DIAGNOSIS — I152 Hypertension secondary to endocrine disorders: Secondary | ICD-10-CM

## 2023-07-17 DIAGNOSIS — G43109 Migraine with aura, not intractable, without status migrainosus: Secondary | ICD-10-CM | POA: Insufficient documentation

## 2023-07-17 DIAGNOSIS — E039 Hypothyroidism, unspecified: Secondary | ICD-10-CM | POA: Diagnosis not present

## 2023-07-17 DIAGNOSIS — Z720 Tobacco use: Secondary | ICD-10-CM

## 2023-07-17 LAB — CBC
HCT: 51.9 % — ABNORMAL HIGH (ref 36.0–46.0)
Hemoglobin: 17.2 g/dL — ABNORMAL HIGH (ref 12.0–15.0)
MCHC: 33.2 g/dL (ref 30.0–36.0)
MCV: 90.5 fL (ref 78.0–100.0)
Platelets: 151 10*3/uL (ref 150.0–400.0)
RBC: 5.74 Mil/uL — ABNORMAL HIGH (ref 3.87–5.11)
RDW: 14.7 % (ref 11.5–15.5)
WBC: 7 10*3/uL (ref 4.0–10.5)

## 2023-07-17 LAB — COMPREHENSIVE METABOLIC PANEL
ALT: 23 U/L (ref 0–35)
AST: 23 U/L (ref 0–37)
Albumin: 4.2 g/dL (ref 3.5–5.2)
Alkaline Phosphatase: 81 U/L (ref 39–117)
BUN: 17 mg/dL (ref 6–23)
CO2: 26 meq/L (ref 19–32)
Calcium: 9.5 mg/dL (ref 8.4–10.5)
Chloride: 104 meq/L (ref 96–112)
Creatinine, Ser: 0.78 mg/dL (ref 0.40–1.20)
GFR: 79.16 mL/min (ref >60.00)
Glucose, Bld: 140 mg/dL — ABNORMAL HIGH (ref 70–99)
Potassium: 3.9 meq/L (ref 3.5–5.1)
Sodium: 139 meq/L (ref 135–145)
Total Bilirubin: 0.6 mg/dL (ref 0.2–1.2)
Total Protein: 6.7 g/dL (ref 6.0–8.3)

## 2023-07-17 LAB — POCT GLYCOSYLATED HEMOGLOBIN (HGB A1C): Hemoglobin A1C: 7.2 % — AB (ref 4.0–5.6)

## 2023-07-17 LAB — LIPID PANEL
Cholesterol: 135 mg/dL (ref 0–200)
HDL: 47.3 mg/dL (ref >39.00)
LDL Cholesterol: 54 mg/dL (ref 0–99)
NonHDL: 87.51
Total CHOL/HDL Ratio: 3
Triglycerides: 166 mg/dL — ABNORMAL HIGH (ref 0.0–149.0)
VLDL: 33.2 mg/dL (ref 0.0–40.0)

## 2023-07-17 LAB — TSH: TSH: 0.87 u[IU]/mL (ref 0.35–5.50)

## 2023-07-17 MED ORDER — VARENICLINE TARTRATE (STARTER) 0.5 MG X 11 & 1 MG X 42 PO TBPK: ORAL_TABLET | ORAL | 0 refills | Status: DC

## 2023-07-17 NOTE — Assessment & Plan Note (Signed)
History of same.  Patient would like to try Chantix to stop smoking.  States he tried the medication in the past and it worked well.  Pending urine microscopy rule out microscopic hematuria in the setting of tobacco abuse.

## 2023-07-17 NOTE — Assessment & Plan Note (Signed)
Patient currently maintained on atenolol 25 mg and lisinopril 10 mg daily.  Blood pressure well-controlled.  Continue medication as prescribed

## 2023-07-17 NOTE — Assessment & Plan Note (Signed)
Discussed age-appropriate immunizations and screening exams.  Did review patient's personal, surgical, social, family histories.  Patient is up-to-date on all age-appropriate vaccinations she would like.  Patient is up-to-date on CRC screening.  Patient refused breast cancer screening.  Patient is aged out out of cervical cancer screening and is status post hysterectomy.  Patient was amendable to screening of osteoporosis.  DEXA scan placed today patient intervention call and schedule appointment.  Patient was also given information at discharge about preventative healthcare maintenance with anticipatory guidance

## 2023-07-17 NOTE — Assessment & Plan Note (Signed)
History of the same they went to remission for some time headaches and started back patient reports 5 times a week.  Patient states she took an antimigraine medication from her son that worked well but is unsure the name.  She will reach out to me when she figures out the name of the medication

## 2023-07-17 NOTE — Assessment & Plan Note (Signed)
Patient currently maintained on Spiriva and albuterol.  Patient not considered controlled but has tried Symbicort and Advair in the past with adverse drug events.

## 2023-07-17 NOTE — Progress Notes (Signed)
Established Patient Office Visit  Subjective   Patient ID: Valerie Lamb, female    DOB: 1955/08/21  Age: 68 y.o. MRN: [id number]  Chief Complaint  Patient presents with   Diabetes   Influenza    Would like flu shot.      DM2: currently maintained on CGM, Jardiance 25, Glipizide 5, trulicity 3 mg. She was having hypoglycemia last office visit and we switch glipzide to XR. States that on Wednesday she is doing trulicity she will skip her glipizide.  Doing that she is avoiding hypoglycemia  HTN: states that she does not check it at home. She is currently maintained on atenolol 25 mg and lisinopril 10 mg daily  COPD: states that she is using the albuterol infrequenlty States that she uses it 4-5 times a week. She has been on symbicort and adviar and they did not do well for her. She did have adverse event  Hypothyroid: on levothyrozine 100 mcg daily  for complete physical and follow up of chronic conditions.  Immunizations: -Tetanus: Completed  2021 -Influenza: Update today -Shingles: Started the shingles but did not complete due to adverse event related to first injection -Pneumonia: Completed  2023  Diet: Fair diet. She is eating 2 meals a day with some snacks. Drinking 1 cup of coffee a day and juice, water and some soda  Exercise: No regular exercise. States that she walks and plays iwht the dog   Eye exam: Completes annually. 02/2023  Dental exam: Completes semi-annually    Colonoscopy: Completed in 2019.  Patient does not want to continue colonoscopies Lung Cancer Screening: Patient qualifies patient refused  Pap smear: Aged out and hysterectomy  Dexa: Ambulatory referral [place]   Mammogram: Patient qualifies, patient refuses  Headahces: history of migraines. She is having 5 headahces a week. Throbbing of the front part of the head and down the neck . Lught senstiviety and nausea or vomiting. She will get blurred vision prior to the headache.  Patient states  that her son gave her some type of migraine medicine that was very helpful but she is unsure the name and reach back out to me      Review of Systems  Constitutional:  Negative for chills and fever.  Respiratory:  Negative for shortness of breath.   Cardiovascular:  Negative for chest pain and leg swelling.  Gastrointestinal:  Negative for abdominal pain, blood in stool, constipation, diarrhea, nausea and vomiting.       BM daily every other   Genitourinary:  Negative for dysuria and hematuria.  Neurological:  Negative for tingling and headaches.  Psychiatric/Behavioral:  Negative for hallucinations and suicidal ideas.       Objective:     BP 106/72   Pulse 70   Temp 97.9 F (36.6 C) (Oral)   Ht 5\' 7"  (1.702 m)   Wt 231 lb 3.2 oz (104.9 kg)   SpO2 95%   BMI 36.21 kg/m  BP Readings from Last 3 Encounters:  07/17/23 106/72  04/10/23 108/72  01/09/23 100/80   Wt Readings from Last 3 Encounters:  07/17/23 231 lb 3.2 oz (104.9 kg)  04/10/23 225 lb (102.1 kg)  01/09/23 228 lb 3.2 oz (103.5 kg)   SpO2 Readings from Last 3 Encounters:  07/17/23 95%  04/10/23 90%  01/09/23 93%      Physical Exam Vitals and nursing note reviewed.  Constitutional:      Appearance: Normal appearance.  HENT:     Right Ear: Tympanic membrane, ear  canal and external ear normal.     Left Ear: Tympanic membrane, ear canal and external ear normal.     Mouth/Throat:     Mouth: Mucous membranes are moist.     Pharynx: Oropharynx is clear.  Eyes:     Extraocular Movements: Extraocular movements intact.     Pupils: Pupils are equal, round, and reactive to light.  Cardiovascular:     Rate and Rhythm: Normal rate and regular rhythm.     Pulses: Normal pulses.     Heart sounds: Normal heart sounds.  Pulmonary:     Effort: Pulmonary effort is normal.     Breath sounds: Normal breath sounds.  Abdominal:     General: Bowel sounds are normal. There is no distension.     Palpations: There is no  mass.     Tenderness: There is no abdominal tenderness.     Hernia: No hernia is present.  Musculoskeletal:     Right lower leg: No edema.     Left lower leg: No edema.  Lymphadenopathy:     Cervical: No cervical adenopathy.  Skin:    General: Skin is warm.  Neurological:     General: No focal deficit present.     Mental Status: She is alert.     Deep Tendon Reflexes:     Reflex Scores:      Bicep reflexes are 2+ on the right side and 2+ on the left side.      Patellar reflexes are 2+ on the right side and 2+ on the left side.    Comments: Bilateral upper and lower extremity strength 5/5  Psychiatric:        Mood and Affect: Mood normal.        Behavior: Behavior normal.        Thought Content: Thought content normal.        Judgment: Judgment normal.    Title   Diabetic Foot Exam - detailed Is there a history of foot ulcer?: No Is there a foot ulcer now?: No Is there swelling?: No Is there elevated skin temperature?: No Is there abnormal foot shape?: No Is there a claw toe deformity?: No Are the toenails long?: No Are the toenails thick?: No Are the toenails ingrown?: No Pulse Foot Exam completed.: Yes   Right Posterior Tibialis: Present Left posterior Tibialis: Present   Right Dorsalis Pedis: Present Left Dorsalis Pedis: Present     Sensory Foot Exam Completed.: Yes Semmes-Weinstein Monofilament Test "+" means "has sensation" and "-" means "no sensation"      Image components are not supported.   Image components are not supported. Image components are not supported.  Tuning [place] Comments All 10 sites tested sensation intact bilaterally       Results for orders placed or performed in visit on 07/17/23  POCT glycosylated hemoglobin (Hb A1C)  Result Value Ref Range   Hemoglobin A1C 7.2 (A) 4.0 - 5.6 %   HbA1c POC (<> result, manual entry)     HbA1c, POC (prediabetic range)     HbA1c, POC (controlled diabetic range)        The ASCVD Risk score  (Arnett DK, et al., 2019) failed to calculate for the following reasons:   Unable to determine if patient is Non-Hispanic African American    Assessment & Plan:   Problem List Items Addressed This Visit       Cardiovascular and Mediastinum   Hypertension associated with diabetes [facility])   Patient currently  maintained on atenolol 25 mg and lisinopril 10 mg daily.  Blood pressure well-controlled.  Continue medication as prescribed      Migraine with aura and without status migrainosus, not intractable   History of the same they went to remission for some time headaches and started back patient reports 5 times a week.  Patient states she took an antimigraine medication from her son that worked well but is unsure the name.  She will reach out to me when she figures out the name of the medication        Respiratory   COPD (chronic obstructive pulmonary disease) (HCC)   Patient currently maintained on Spiriva and albuterol.  Patient not considered controlled but has tried Symbicort and Advair in the past with adverse drug events.      Relevant Medications   Varenicline Tartrate, Starter, (CHANTIX STARTING MONTH PAK) 0.5 MG X 11 & 1 MG X 42 TBPK     Endocrine   Hypothyroidism   Relevant Orders   TSH   Type 2 diabetes mellitus with hyperglycemia, with long-term current use of insulin (HCC)   Patient currently maintained on Jardiance 25 mg, glipizide 5 mg XL, Trulicity 3 mg weekly.  A1c is trending down.  Will continue work on lifestyle applications continue medications prescribed      Relevant Orders   POCT glycosylated hemoglobin (Hb A1C) (Completed)   CBC   Comprehensive metabolic panel   Lipid panel     Nervous and Auditory   Neuropathy   Patient currently maintained on Lyrica 75 mg twice daily.  Continue        Other   Tobacco abuse   History of same.  Patient would like to try Chantix to stop smoking.  States he tried the medication in the past and it worked well.  Pending  urine microscopy rule out microscopic hematuria in the setting of tobacco abuse.      Relevant Orders   Urine Microscopic   Preventative health care - Primary   Discussed age-appropriate immunizations and screening exams.  Did review patient's personal, surgical, social, family histories.  Patient is up-to-date on all age-appropriate vaccinations she would like.  Patient is up-to-date on CRC screening.  Patient refused breast cancer screening.  Patient is aged out out of cervical cancer screening and is status post hysterectomy.  Patient was amendable to screening of osteoporosis.  DEXA scan placed today patient intervention call and schedule appointment.  Patient was also given information at discharge about preventative healthcare maintenance with anticipatory guidance      Other Visit Diagnoses       Screening for osteoporosis       Relevant Orders   DG Bone Density       Return in about 4 months (around 11/14/2023) for DM recheck.    Audria Nine, NP

## 2023-07-17 NOTE — Patient Instructions (Signed)
Nice to see you today I will be in touch with the labs Follow up with me in 4 months, sooner if you need me

## 2023-07-17 NOTE — Assessment & Plan Note (Signed)
Patient currently maintained on Jardiance 25 mg, glipizide 5 mg XL, Trulicity 3 mg weekly.  A1c is trending down.  Will continue work on lifestyle applications continue medications prescribed

## 2023-07-17 NOTE — Assessment & Plan Note (Signed)
Patient currently maintained on Lyrica 75 mg twice daily.  Continue

## 2023-07-23 ENCOUNTER — Encounter: Payer: Self-pay | Admitting: Nurse Practitioner

## 2023-07-30 ENCOUNTER — Encounter: Payer: Self-pay | Admitting: Nurse Practitioner

## 2023-07-30 DIAGNOSIS — G43109 Migraine with aura, not intractable, without status migrainosus: Secondary | ICD-10-CM

## 2023-07-30 MED ORDER — SUMATRIPTAN SUCCINATE 100 MG PO TABS: 100.0000 mg | ORAL_TABLET | ORAL | 0 refills | Status: DC | PRN

## 2023-08-05 ENCOUNTER — Other Ambulatory Visit: Payer: Self-pay | Admitting: Nurse Practitioner

## 2023-08-05 DIAGNOSIS — E1122 Type 2 diabetes mellitus with diabetic chronic kidney disease: Secondary | ICD-10-CM

## 2023-08-17 ENCOUNTER — Encounter: Payer: Self-pay | Admitting: Nurse Practitioner

## 2023-09-01 ENCOUNTER — Ambulatory Visit: Payer: 59 | Admitting: Dermatology

## 2023-09-10 ENCOUNTER — Telehealth: Payer: Self-pay

## 2023-09-10 NOTE — Telephone Encounter (Signed)
 Noted.

## 2023-09-10 NOTE — Transitions of Care (Post Inpatient/ED Visit) (Signed)
 I spoke with pt; pt fell on 09/06/23 at home pt lost footing; pt seen at [facility] ED and sprained ankle; pt still using crutches and ace wrap. Pt said less pain in rt ankle and lt knee. Offered pt appt for ED FU at [facility] but pt said she will cb if she does not continue to improve.. UC & ED precautions also given and pt voiced understanding. Sending note to Audria Nine NP.       09/10/2023  Name: Valerie Lamb MRN: [id number] DOB: Jul 26, 1955  Today's TOC FU Call Status: Today's TOC FU Call Status:: Successful TOC FU Call Completed TOC FU Call Complete Date: 09/10/23 Patient's Name and Date of Birth confirmed.  Transition Care Management Follow-up Telephone Call Date of Discharge: 09/06/23 Discharge Facility: Other [facility]) Name of Other (Non-[facility]) Discharge Facility: [facility] ED Type of Discharge: Emergency Department Reason for ED Visit: Other: (pt fell on 09/06/23 at home pt lost footing; pt seen at [facility]  and sprained ankle; pt still using crutches.) How have you been since you were released from the hospital?: Better Any questions or concerns?: No  Items Reviewed: Did you receive and understand the discharge instructions provided?: Yes Medications obtained,verified, and reconciled?: Yes (Medications Reviewed) Any new allergies since your discharge?: No Dietary orders reviewed?: NA Do you have support at home?: Yes People in Home [RPT]: child(ren), adult Name of Support/Comfort Primary Source: Trey Paula  Medications Reviewed Today: Medications Reviewed Today     Reviewed by Patience Musca, LPN (Licensed Practical Nurse) on 09/10/23 at 1116  Med List Status: <None>   Medication Order Taking? Sig Documenting Provider Last Dose Status Informant  albuterol (VENTOLIN HFA) 108 (90 Base) MCG/ACT inhaler [id number] No Inhale 1-2 puffs into the lungs every 6 (six) hours as needed for shortness of breath or wheezing. Eden Emms, NP Taking Active   amitriptyline  (ELAVIL) 25 MG tablet [id number] No TAKE 1 TABLET BY MOUTH EVERY DAY AT BEDTIME *PLEASE CALL OFFICE AND SCHEDULE APPT FOR FURTHER REFILLS: *REFILL REQUEST* Penumalli, Glenford Bayley, MD Taking Active   atenolol (TENORMIN) 25 MG tablet [id number] No Take 1 tablet (25 mg total) by mouth daily. Eden Emms, NP Taking Active   Continuous Blood Gluc Receiver (DEXCOM G7 RECEIVER) DEVI [id number] No 1 each by Does not apply route daily. DX E11.22 Eden Emms, NP Taking Active   Continuous Glucose Sensor (DEXCOM G7 SENSOR) MISC [id number] No Apply sensor every 10 days as directed Eden Emms, NP Taking Active   Dulaglutide (TRULICITY) 3 MG/0.5ML Ivory Broad [id number]  INJECT CONTENTS OF 1 PEN ONCE A WEEK Eden Emms, NP  Active   empagliflozin (JARDIANCE) 25 MG TABS tablet [id number] No Take 1 tablet (25 mg total) by mouth daily. Eden Emms, NP Taking Active   glipiZIDE (GLUCOTROL XL) 5 MG 24 hr tablet [id number] No Take 1 tablet (5 mg total) by mouth daily with breakfast. Eden Emms, NP Taking Active   ibuprofen (ADVIL) 800 MG tablet [id number] No Take 1 tablet (800 mg total) by mouth every 8 (eight) hours as needed. Eden Emms, NP Taking Active   levocetirizine (XYZAL) 5 MG tablet [id number] No Take 1 tablet (5 mg total) by mouth every evening. Eden Emms, NP Taking Active   levothyroxine (SYNTHROID) 100 MCG tablet [id number] No Take 1 tablet (100 mcg total) by mouth daily before breakfast. Eden Emms, NP Taking Active   lisinopril (ZESTRIL) 10 MG tablet [id number] No Take 1  tablet (10 mg total) by mouth daily. Eden Emms, NP Taking Active   Melatonin 10 MG TABS [id number] No Take 10 mg by mouth at bedtime. [provider] Taking Active Self  montelukast (SINGULAIR) 10 MG tablet [id number] No Take 1 tablet (10 mg total) by mouth at bedtime. Eden Emms, NP Taking Active   omeprazole (PRILOSEC) 40 MG capsule [id number] No Take 1 capsule (40 mg total) by mouth 2 (two) times daily. Eden Emms, NP Taking Active   pregabalin (LYRICA) 75 MG capsule [id number] No Take 1 capsule (75 mg total) by mouth 2 (two) times daily. Take 1 capsule (75MG ) in the morning and 2 capsules (150mg ) in the evening Eden Emms, NP Taking Active   rosuvastatin (CRESTOR) 20 MG tablet [id number] No Take 1 tablet (20 mg total) by mouth daily. Eden Emms, NP Taking Active   SUMAtriptan (IMITREX) 100 MG tablet [id number]  Take 1 tablet (100 mg total) by mouth every 2 (two) hours as needed for migraine. May repeat in 2 hours if headache persists or recurs. Eden Emms, NP  Active   Tiotropium Bromide Monohydrate 2.5 MCG/ACT AERS [id number] No Inhale 2 puffs into the lungs daily. Eden Emms, NP Taking Active   Varenicline Tartrate, Starter, (CHANTIX STARTING MONTH PAK) 0.5 MG X 11 & 1 MG X 42 TBPK [id number]  Take one 0.5 mg tablet by mouth once daily for 3 days, then increase to one 0.5 mg tablet twice daily for 4 days, then increase to one 1 mg tablet twice daily. Eden Emms, NP  Active             Home Care and Equipment/Supplies: Were Home Health Services Ordered?: NA Any new equipment or medical supplies ordered?: Yes (pt given crutches and ace bandage) Name of Medical supply agency?: given at ED Were you able to get the equipment/medical supplies?: Yes Do you have any questions related to the use of the equipment/supplies?: No  Functional Questionnaire: Do you need assistance with bathing/showering or dressing?: No Do you need assistance with meal preparation?: No Do you need assistance with eating?: No Do you have difficulty maintaining continence: No Do you have difficulty managing or taking your medications?: No  Follow up appointments reviewed: PCP Follow-up appointment confirmed?: NA (pt will cb if needed for appt.) [facility] Follow-up appointment confirmed?: NA Do you need transportation to your follow-up appointment?: No Do you understand care options if your  condition(s) worsen?: Yes-patient verbalized understanding    SIGNATURE Lewanda Rife, LPN

## 2023-09-24 ENCOUNTER — Other Ambulatory Visit: Payer: Self-pay | Admitting: Nurse Practitioner

## 2023-09-24 DIAGNOSIS — E1165 Type 2 diabetes mellitus with hyperglycemia: Secondary | ICD-10-CM

## 2023-09-24 DIAGNOSIS — G8929 Other chronic pain: Secondary | ICD-10-CM

## 2023-10-03 ENCOUNTER — Other Ambulatory Visit: Payer: Self-pay | Admitting: Nurse Practitioner

## 2023-10-03 DIAGNOSIS — E1122 Type 2 diabetes mellitus with diabetic chronic kidney disease: Secondary | ICD-10-CM

## 2023-10-09 ENCOUNTER — Encounter: Payer: Self-pay | Admitting: Nurse Practitioner

## 2023-10-09 ENCOUNTER — Ambulatory Visit (INDEPENDENT_AMBULATORY_CARE_PROVIDER_SITE_OTHER): Admitting: Nurse Practitioner

## 2023-10-09 ENCOUNTER — Other Ambulatory Visit: Payer: Self-pay | Admitting: Nurse Practitioner

## 2023-10-09 ENCOUNTER — Ambulatory Visit (INDEPENDENT_AMBULATORY_CARE_PROVIDER_SITE_OTHER)
Admission: RE | Admit: 2023-10-09 | Discharge: 2023-10-09 | Disposition: A | Discharge status: Home / Self Care | Source: Ambulatory Visit | Attending: Nurse Practitioner | Admitting: Nurse Practitioner

## 2023-10-09 VITALS — BP 118/68 | HR 67 | Temp 97.9°F | Ht 67.0 in | Wt 232.4 lb

## 2023-10-09 DIAGNOSIS — E1122 Type 2 diabetes mellitus with diabetic chronic kidney disease: Secondary | ICD-10-CM | POA: Diagnosis not present

## 2023-10-09 DIAGNOSIS — M25579 Pain in unspecified ankle and joints of unspecified foot: Secondary | ICD-10-CM | POA: Diagnosis not present

## 2023-10-09 DIAGNOSIS — M79673 Pain in unspecified foot: Secondary | ICD-10-CM | POA: Diagnosis not present

## 2023-10-09 DIAGNOSIS — M79671 Pain in right foot: Secondary | ICD-10-CM

## 2023-10-09 DIAGNOSIS — W19XXXD Unspecified fall, subsequent encounter: Secondary | ICD-10-CM

## 2023-10-09 DIAGNOSIS — Z720 Tobacco use: Secondary | ICD-10-CM | POA: Diagnosis not present

## 2023-10-09 DIAGNOSIS — M25571 Pain in right ankle and joints of right foot: Secondary | ICD-10-CM | POA: Diagnosis not present

## 2023-10-09 DIAGNOSIS — Z7985 Long-term (current) use of injectable non-insulin antidiabetic drugs: Secondary | ICD-10-CM

## 2023-10-09 DIAGNOSIS — W19XXXA Unspecified fall, initial encounter: Secondary | ICD-10-CM | POA: Insufficient documentation

## 2023-10-09 DIAGNOSIS — Z7984 Long term (current) use of oral hypoglycemic drugs: Secondary | ICD-10-CM

## 2023-10-09 LAB — URINALYSIS, MICROSCOPIC ONLY

## 2023-10-09 MED ORDER — SUMATRIPTAN SUCCINATE 100 MG PO TABS: 100.0000 mg | ORAL_TABLET | ORAL | 0 refills | Status: DC | PRN

## 2023-10-09 MED ORDER — DEXCOM G7 SENSOR MISC: 1 refills | Status: DC

## 2023-10-09 NOTE — Progress Notes (Signed)
 Established Patient Office Visit  Subjective   Patient ID: Valerie Lamb, female    DOB: September 04, 1955  Age: 68 y.o. MRN: [id number]  Chief Complaint  Patient presents with   Medical Management of Chronic Issues    Here for checkup. Also, pt provided urine sample that was provided at 07/17/23 OV [future order].     HPI  Fall:states that she fell on 09/06/2023. States that she was coming through a door and her right ankle rolled. States that she hit left knee and back. State that her ankle is still hurting. She has tried motrin . She is having a throbbing pain in the ankle that is 90 percent of thei time.States that she may have some swelling.  States that she did use an ace bandage for the ankle. States that she will wrap it when it really starts to bother her. It will help for approx 10 mins then it starts bother her. States that she has used heat and ice that will help and on the first two weeks she has staayed off States that she says it was a 10 and now a 2 on the pain scale    Review of Systems  Constitutional:  Negative for chills and fever.  Respiratory:  Negative for shortness of breath.   Cardiovascular:  Negative for chest pain.  Musculoskeletal:  Positive for joint pain.  Neurological:  Positive for tingling and weakness.      Objective:     BP 118/68   Pulse 67   Temp 97.9 F (36.6 C) (Oral)   Ht 5\' 7"  (1.702 m)   Wt 232 lb 6 oz (105.4 kg)   SpO2 96%   BMI 36.40 kg/m    Physical Exam Vitals and nursing note reviewed.  Constitutional:      Appearance: Normal appearance.  Cardiovascular:     Rate and Rhythm: Normal rate and regular rhythm.     Heart sounds: Normal heart sounds.  Pulmonary:     Effort: Pulmonary effort is normal.     Breath sounds: Normal breath sounds.  Musculoskeletal:        General: Tenderness present.       Feet:  Feet:     Comments: DP and PT pulse 2+  ROM present limited due to pain   Some edema Neurological:     Mental  Status: She is alert.      Results for orders placed or performed in visit on 10/09/23  Urine Microscopic  Result Value Ref Range   WBC, UA 0-2/hpf 0-2/hpf   RBC / HPF 0-2/hpf 0-2/hpf   Squamous Epithelial / HPF Rare(0-4/hpf) Rare(0-4/hpf)   Bacteria, UA Rare(<10/hpf) (A) None      The ASCVD Risk score (Arnett DK, et al., 2019) failed to calculate for the following reasons:   Unable to determine if patient is Non-Hispanic African American    Assessment & Plan:   Problem List Items Addressed This Visit       Endocrine   Type 2 diabetes mellitus with diabetic chronic kidney disease (HCC)   Relevant Medications   Continuous Glucose Sensor (DEXCOM G7 SENSOR) MISC     Other   Tobacco abuse   Fall - Primary   Patient was seen in the emergency department.  Did review ED note and imaging      Relevant Orders   DG Foot Complete Right (Completed)   DG Ankle Complete Right (Completed)   Foot and ankle pain   Will reimage to make  sure there was no stress fracture that was missed on initial imaging.  Patient can continue the over-the-counter analgesics rest ice and heat as needed encouraged brace/compression wrap while patient is on her feet moving around.  If she does not continue to have improvement in x-rays negative consider Ortho referral      Relevant Orders   DG Foot Complete Right (Completed)   DG Ankle Complete Right (Completed)    Return in about 4 weeks (around 11/06/2023) for DM recheck.    Margarie Shay, NP

## 2023-10-09 NOTE — Assessment & Plan Note (Signed)
 Patient was seen in the emergency department.  Did review ED note and imaging

## 2023-10-09 NOTE — Patient Instructions (Signed)
 Nice to see you today Use the brace when you are up and walking Over the counter analgesics as needed Heat and ice is good to use Follow up with me in 1 month, sooner if you need me

## 2023-10-09 NOTE — Assessment & Plan Note (Signed)
 Will reimage to make sure there was no stress fracture that was missed on initial imaging.  Patient can continue the over-the-counter analgesics rest ice and heat as needed encouraged brace/compression wrap while patient is on her feet moving around.  If she does not continue to have improvement in x-rays negative consider Ortho referral

## 2023-10-12 ENCOUNTER — Encounter: Payer: Self-pay | Admitting: Nurse Practitioner

## 2023-10-13 ENCOUNTER — Encounter: Payer: Self-pay | Admitting: Nurse Practitioner

## 2023-10-13 ENCOUNTER — Ambulatory Visit: Payer: Self-pay | Admitting: Nurse Practitioner

## 2023-10-13 ENCOUNTER — Other Ambulatory Visit: Payer: Self-pay | Admitting: Nurse Practitioner

## 2023-10-13 DIAGNOSIS — M79673 Pain in unspecified foot: Secondary | ICD-10-CM

## 2023-10-13 DIAGNOSIS — E785 Hyperlipidemia, unspecified: Secondary | ICD-10-CM

## 2023-10-21 ENCOUNTER — Other Ambulatory Visit: Payer: Self-pay | Admitting: Nurse Practitioner

## 2023-10-21 DIAGNOSIS — R251 Tremor, unspecified: Secondary | ICD-10-CM

## 2023-10-21 DIAGNOSIS — E039 Hypothyroidism, unspecified: Secondary | ICD-10-CM

## 2023-10-27 ENCOUNTER — Other Ambulatory Visit: Payer: Self-pay | Admitting: Nurse Practitioner

## 2023-10-27 DIAGNOSIS — E1165 Type 2 diabetes mellitus with hyperglycemia: Secondary | ICD-10-CM

## 2023-10-28 ENCOUNTER — Ambulatory Visit: Admitting: Orthopedic Surgery

## 2023-10-28 ENCOUNTER — Encounter: Payer: Self-pay | Admitting: Orthopedic Surgery

## 2023-10-28 VITALS — BP 129/78 | Ht 67.0 in | Wt 232.0 lb

## 2023-10-28 DIAGNOSIS — S92251A Displaced fracture of navicular [scaphoid] of right foot, initial encounter for closed fracture: Secondary | ICD-10-CM | POA: Diagnosis not present

## 2023-10-28 NOTE — Progress Notes (Signed)
 New Patient Visit  Assessment: Valerie Lamb is a 68 y.o. female with the following: 1. Closed avulsion fracture of navicular bone of right foot, initial encounter  Plan: Valerie Lamb fell almost 2 months ago.  She sustained an avulsion fracture of the navicular of the right foot.  Radiographs from a few weeks ago demonstrates stable alignment of the avulsion fracture.  On physical exam, she has mild tenderness to palpation over the medial aspect of the midfoot.  Minimal swelling.  No bruising.  Provided her with ankle exercises to initiate.  Weightbearing as tolerated.  Okay to continue on a regular shoe.  Medicines as needed.  Ice the foot as needed.  Elevate the foot to help with swelling.  If she continues to have issues, she will contact the clinic.  Otherwise, follow-up as needed.  Follow-up: Return if symptoms worsen or fail to improve.  Subjective:  Chief Complaint  Patient presents with   Foot Pain    Fell 2 months ago and dr said she has a chipped bone in her foot , motrin  prn  keeps her up at night pain stays in the inside of the ankle     History of Present Illness: Valerie Lamb is a 68 y.o. female who has been referred by Winthrop Hawks, NP for evaluation of right foot pain.  Approximately 2 months ago, she fell.  She injured her right foot.  She was evaluated in an emergency department, and radiographs were negative.  She was provided an Ace wrap.  She continued to have pain, and repeat x-rays demonstrated an avulsion fracture.  She was subsequently referred to clinic.  She takes Motrin  as needed.  She has not used a walking boot.  She is using a regular shoe.  She continues to have occasional throbbing.  She notes some swelling.  No prior injuries to the right foot or ankle.   Review of Systems: No fevers or chills No numbness or tingling No chest pain No shortness of breath No bowel or bladder dysfunction No GI distress No headaches   Medical History:  Past Medical  History:  Diagnosis Date   Anemia    Anxiety    Asthma    COPD (chronic obstructive pulmonary disease) (HCC)    Depression    Diabetes mellitus without complication (HCC)    Endometriosis    Gastric ulcer    GERD (gastroesophageal reflux disease)    Hypertension    Hypothyroidism    Sleep apnea    Thyroid  disease     Past Surgical History:  Procedure Laterality Date   HYSTEROSCOPY WITH D & C N/A 08/23/2020   Procedure: DILATATION AND CURETTAGE /HYSTEROSCOPY, polypectomy;  Surgeon: Heron Lord, MD;  Location: [facility];  Service: Gynecology;  Laterality: N/A;   TUBAL LIGATION     VAGINAL HYSTERECTOMY Bilateral 02/26/2021   Procedure: HYSTERECTOMY VAGINAL;  Surgeon: Othelia Blinks, MD;  Location: [facility] OR;  Service: Gynecology;  Laterality: Bilateral;    Family History  Problem Relation Age of Onset   Alcohol abuse Mother    Arthritis Mother    Asthma Mother    COPD Mother    Depression Mother    Drug abuse Mother    Early death Mother    Hyperlipidemia Mother    Hypertension Mother    Alcohol abuse Father    Cancer Father        lung   Multiple sclerosis Maternal Aunt    Social History   Tobacco Use  Smoking status: Every Day    Current packs/day: 0.50    Average packs/day: 0.5 packs/day for 52.4 years (26.2 ttl pk-yrs)    Types: Cigarettes    Start date: 1973   Smokeless tobacco: Never   Tobacco comments:    2 ppd previously, cut back 2021  Vaping Use   Vaping status: Never Used  Substance Use Topics   Alcohol use: Never   Drug use: Never    Allergies  Allergen Reactions   Semaglutide  Diarrhea    Rybelsus  tablet   Metformin Nausea And Vomiting    Current Meds  Medication Sig   albuterol  (VENTOLIN  HFA) 108 (90 Base) MCG/ACT inhaler Inhale 1-2 puffs into the lungs every 6 (six) hours as needed for shortness of breath or wheezing.   amitriptyline  (ELAVIL ) 25 MG tablet TAKE 1 TABLET BY MOUTH EVERY DAY AT BEDTIME *PLEASE CALL OFFICE AND SCHEDULE  APPT FOR FURTHER REFILLS: *REFILL REQUEST*   atenolol  (TENORMIN ) 25 MG tablet Take 1 tablet by mouth once daily   Continuous Blood Gluc Receiver (DEXCOM G7 RECEIVER) DEVI 1 each by Does not apply route daily. DX E11.22   Continuous Glucose Sensor (DEXCOM G7 SENSOR) MISC CHANGE EVERY 10 DAYS   Dulaglutide  (TRULICITY ) 3 MG/0.5ML SOAJ INJECT CONTENTS OF 1 PEN ONCE A WEEK   empagliflozin  (JARDIANCE ) 25 MG TABS tablet Take 1 tablet by mouth once daily   glipiZIDE  (GLUCOTROL  XL) 5 MG 24 hr tablet Take 1 tablet (5 mg total) by mouth daily with breakfast.   ibuprofen  (ADVIL ) 800 MG tablet TAKE 1 TABLET BY MOUTH EVERY 8 HOURS AS NEEDED   levocetirizine (XYZAL ) 5 MG tablet Take 1 tablet (5 mg total) by mouth every evening.   levothyroxine  (SYNTHROID ) 100 MCG tablet TAKE 1 TABLET BY MOUTH ONCE DAILY BEFORE BREAKFAST   lisinopril  (ZESTRIL ) 10 MG tablet Take 1 tablet (10 mg total) by mouth daily.   Melatonin 10 MG TABS Take 10 mg by mouth at bedtime.   montelukast  (SINGULAIR ) 10 MG tablet Take 1 tablet (10 mg total) by mouth at bedtime.   omeprazole  (PRILOSEC) 40 MG capsule Take 1 capsule (40 mg total) by mouth 2 (two) times daily.   rosuvastatin  (CRESTOR ) 20 MG tablet Take 1 tablet by mouth once daily   SUMAtriptan  (IMITREX ) 100 MG tablet Take 1 tablet (100 mg total) by mouth every 2 (two) hours as needed for migraine. May repeat in 2 hours if headache persists or recurs.   Tiotropium Bromide  Monohydrate 2.5 MCG/ACT AERS Inhale 2 puffs into the lungs daily.    Objective: BP 129/78   Ht 5\' 7"  (1.702 m)   Wt 232 lb (105.2 kg)   BMI 36.34 kg/m   Physical Exam:  General: Alert and oriented. and No acute distress. Gait: Normal gait.  Evaluation of right foot demonstrates minimal swelling.  No bruising.  Mild tenderness to palpation over the medial midfoot.  She has some irritation with dorsiflexion beyond it neutral position.  She is lacking some dorsiflexion with her knee extended.  2+ DP pulse.  Toes  warm and well-perfused.  IMAGING: I personally ordered and reviewed the following images  X-rays of the right foot and ankle were reviewed in clinic today.  Minimally displaced avulsion fracture of the medial aspect of the navicular.   New Medications:  No orders of the defined types were placed in this encounter.     Tonita Frater, MD  10/28/2023 3:27 PM

## 2023-10-28 NOTE — Patient Instructions (Signed)
Instructions ° °1.  You have sustained an ankle sprain, or similar exercises that can be treated as an ankle sprain.  **These exercises can also be used as part of recovery from an ankle fracture.  °2.  I encourage you to stay on your feet and gradually remove your walking boot.   °3.  Below are some exercises that you can complete on your own to improve your symptoms.  °4.  As an alternative, you can search for ankle sprain exercises online, and can see some demonstrations on YouTube  °5.  If you are having difficulty with these exercises, we can also prescribe formal physical therapy ° °Ankle Exercises °Ask your health care provider which exercises are safe for you. Do exercises exactly as told by your health care provider and adjust them as directed. It is normal to feel mild stretching, pulling, tightness, or mild discomfort as you do these exercises. Stop right away if you feel sudden pain or your pain gets worse. Do not begin these exercises until told by your health care provider. ° °Stretching and range-of-motion exercises °These exercises warm up your muscles and joints and improve the movement and flexibility of your ankle. These exercises may also help to relieve pain. ° °Dorsiflexion/plantar flexion ° °Sit with your R knee straight or bent. Do not rest your foot on anything. °Flex your left ankle to tilt the top of your foot toward your shin. This is called dorsiflexion. °Hold this position for 5 seconds. °Point your toes downward to tilt the top of your foot away from your shin. This is called plantar flexion. °Hold this position for 5 seconds. °Repeat 10 times. Complete this exercise 2-3 times a day.  As tolerated ° °Ankle alphabet ° °Sit with your R foot supported at your lower leg. °Do not rest your foot on anything. °Make sure your foot has room to move freely. °Think of your R foot as a paintbrush: °Move your foot to trace each letter of the alphabet in the air. Keep your hip and knee still while  you trace the letters. Trace every letter from A to Z. °Make the letters as large as you can without causing or increasing any discomfort. ° °Repeat 2-3 times. Complete this exercise 2-3 times a day. ° ° °Strengthening exercises °These exercises build strength and endurance in your ankle. Endurance is the ability to use your muscles for a long time, even after they get tired. °Dorsiflexors °These are muscles that lift your foot up. °Secure a rubber exercise band or tube to an object, such as a table leg, that will stay still when the band is pulled. Secure the other end around your R foot. °Sit on the floor, facing the object with your R leg extended. The band or tube should be slightly tense when your foot is relaxed. °Slowly flex your R ankle and toes to bring your foot toward your shin. °Hold this position for 5 seconds. °Slowly return your foot to the starting position, controlling the band as you do that. °Repeat 10 times. Complete this exercise 2-3 times a day. ° °Plantar flexors °These are muscles that push your foot down. °Sit on the floor with your R leg extended. °Loop a rubber exercise band or tube around the ball of your R foot. The ball of your foot is on the walking surface, right under your toes. The band or tube should be slightly tense when your foot is relaxed. °Slowly point your toes downward, pushing them away from   you. °Hold this position for 5 seconds. °Slowly release the tension in the band or tube, controlling smoothly until your foot is back in the starting position. °Repeat 10 times. Complete this exercise 2-3 times a day. ° °Towel curls ° °Sit in a chair on a non-carpeted surface, and put your feet on the floor. °Place a towel in front of your feet. °Keeping your heel on the floor, put your R foot on the towel. °Pull the towel toward you by grabbing the towel with your toes and curling them under. Keep your heel on the floor. °Let your toes relax. °Grab the towel again. Keep pulling the  towel until it is completely underneath your foot. °Repeat 10 times. Complete this exercise 2-3 times a day. ° °Standing plantar flexion °This is an exercise in which you use your toes to lift your body's weight while standing. °Stand with your feet shoulder-width apart. °Keep your weight spread evenly over the width of your feet while you rise up on your toes. Use a wall or table to steady yourself if needed, but try not to use it for support. °If this exercise is too easy, try these options: °Shift your weight toward your R leg until you feel challenged. °If told by your health care provider, lift your uninjured leg off the floor. °Hold this position for 5 seconds. °Repeat 10 times. Complete this exercise 2-3 times a day. ° °Tandem walking °Stand with one foot directly in front of the other. °Slowly raise your back foot up, lifting your heel before your toes, and place it directly in front of your other foot. °Continue to walk in this heel-to-toe way. Have a countertop or wall nearby to use if needed to keep your balance, but try not to hold onto anything for support. ° °Repeat 10 times. Complete this exercise 2-3 times a day. ° ° °Document Revised: 02/13/2018 Document Reviewed: 02/15/2018 °Elsevier Patient Education © 2020 [employer] ° °

## 2023-10-30 ENCOUNTER — Other Ambulatory Visit: Payer: Self-pay | Admitting: Nurse Practitioner

## 2023-10-30 DIAGNOSIS — E1122 Type 2 diabetes mellitus with diabetic chronic kidney disease: Secondary | ICD-10-CM

## 2023-10-31 ENCOUNTER — Encounter: Payer: Self-pay | Admitting: Nurse Practitioner

## 2023-10-31 DIAGNOSIS — E1122 Type 2 diabetes mellitus with diabetic chronic kidney disease: Secondary | ICD-10-CM

## 2023-10-31 DIAGNOSIS — E1165 Type 2 diabetes mellitus with hyperglycemia: Secondary | ICD-10-CM

## 2023-11-02 MED ORDER — TRULICITY 3 MG/0.5ML ~~LOC~~ SOAJ: 3.0000 mg | SUBCUTANEOUS | 1 refills | Status: DC

## 2023-11-02 MED ORDER — GLIPIZIDE ER 5 MG PO TB24: 5.0000 mg | ORAL_TABLET | Freq: Every day | ORAL | 1 refills | Status: DC

## 2023-11-03 ENCOUNTER — Other Ambulatory Visit: Payer: Self-pay | Admitting: Nurse Practitioner

## 2023-11-03 ENCOUNTER — Encounter: Payer: Self-pay | Admitting: Nurse Practitioner

## 2023-11-03 DIAGNOSIS — E1122 Type 2 diabetes mellitus with diabetic chronic kidney disease: Secondary | ICD-10-CM

## 2023-11-04 ENCOUNTER — Other Ambulatory Visit: Payer: Self-pay | Admitting: Nurse Practitioner

## 2023-11-04 DIAGNOSIS — G8929 Other chronic pain: Secondary | ICD-10-CM

## 2023-11-04 MED ORDER — DEXCOM G7 SENSOR MISC: 3 refills | Status: DC

## 2023-11-13 ENCOUNTER — Ambulatory Visit: Admitting: Nurse Practitioner

## 2023-11-20 ENCOUNTER — Encounter: Payer: Self-pay | Admitting: Nurse Practitioner

## 2023-11-20 ENCOUNTER — Telehealth: Admitting: Family Medicine

## 2023-11-20 ENCOUNTER — Ambulatory Visit: Payer: Self-pay | Admitting: Nurse Practitioner

## 2023-11-20 DIAGNOSIS — B9689 Other specified bacterial agents as the cause of diseases classified elsewhere: Secondary | ICD-10-CM | POA: Diagnosis not present

## 2023-11-20 DIAGNOSIS — J019 Acute sinusitis, unspecified: Secondary | ICD-10-CM | POA: Diagnosis not present

## 2023-11-20 DIAGNOSIS — R051 Acute cough: Secondary | ICD-10-CM

## 2023-11-20 MED ORDER — AMOXICILLIN-POT CLAVULANATE 875-125 MG PO TABS: 1.0000 | ORAL_TABLET | Freq: Two times a day (BID) | ORAL | 0 refills | Status: DC

## 2023-11-20 MED ORDER — PROMETHAZINE-DM 6.25-15 MG/5ML PO SYRP
5.0000 mL | ORAL_SOLUTION | Freq: Four times a day (QID) | ORAL | 0 refills | Status: AC | PRN
Start: 2023-11-20 — End: 2023-11-30

## 2023-11-20 NOTE — Patient Instructions (Signed)

## 2023-11-20 NOTE — Progress Notes (Signed)
 Virtual Visit Consent   Valerie Lamb, you are scheduled for a virtual visit with a [facility] provider today. Just as with appointments in the office, your consent must be obtained to participate. Your consent will be active for this visit and any virtual visit you may have with one of our providers in the next 365 days. If you have a MyChart account, a copy of this consent can be sent to you electronically.  As this is a virtual visit, video technology does not allow for your provider to perform a traditional examination. This may limit your provider's ability to fully assess your condition. If your provider identifies any concerns that need to be evaluated in person or the need to arrange testing (such as labs, EKG, etc.), we will make arrangements to do so. Although advances in technology are sophisticated, we cannot ensure that it will always work on either your end or our end. If the connection with a video visit is poor, the visit may have to be switched to a telephone visit. With either a video or telephone visit, we are not always able to ensure that we have a secure connection.  By engaging in this virtual visit, you consent to the provision of healthcare and authorize for your insurance to be billed (if applicable) for the services provided during this visit. Depending on your insurance coverage, you may receive a charge related to this service.  I need to obtain your verbal consent now. Are you willing to proceed with your visit today? Nubia Ziesmer has provided verbal consent on 11/20/2023 for a virtual visit (video or telephone). Albertha Huger, FNP  Date: 11/20/2023 3:03 PM   Virtual Visit via Video Note   I, Albertha Huger, connected with  Valerie Lamb  ([id number], 12-08-1955) on 11/20/23 at  3:00 PM EDT by a video-enabled telemedicine application and verified that I am speaking with the correct person using two identifiers.  Location: Patient: Virtual Visit Location Patient:  Home Provider: Virtual Visit Location Provider: Home Office   I discussed the limitations of evaluation and management by telemedicine and the availability of in person appointments. The patient expressed understanding and agreed to proceed.    History of Present Illness: Valerie Lamb is a 68 y.o. who identifies as a female who was assigned female at birth, and is being seen today for sinus pain and pressure with post nasal drainage for almost a week with yellow mucus and a bad cough keeping her up at night. No wheezing or sob. Sx worsening. Valerie Lamb  HPI: HPI  Problems:  Patient Active Problem List   Diagnosis Date Noted   Fall 10/09/2023   Foot and ankle pain 10/09/2023   Preventative health care 07/17/2023   Migraine with aura and without status migrainosus, not intractable 07/17/2023   Allergies 04/10/2023   Right wrist pain 04/10/2023   Chronic back pain 04/10/2023   Cramps of lower extremity 10/03/2022   Encounter for monitoring long-term proton pump inhibitor therapy 10/03/2022   Brittle nails 06/09/2022   Abnormal CBC 06/09/2022   Screening mammography declined 02/17/2022   Type 2 diabetes mellitus with hyperglycemia, with long-term current use of insulin  (HCC) 09/10/2021   Dyslipidemia 09/10/2021   Type 2 diabetes mellitus with diabetic polyneuropathy, with long-term current use of insulin  (HCC) 09/10/2021   H/O vaginal hysterectomy 03/25/2021   Post-operative state 02/26/2021   Morbid obesity (HCC) 10/15/2020   Eczema 10/09/2020   Hyperlipidemia associated with type 2 diabetes mellitus (HCC) 07/12/2020   Numbness  of finger 07/12/2020   Tremor 07/12/2020   Insomnia 06/25/2020   Endometrial hyperplasia without atypia, simple 05/15/2020   Postmenopausal bleeding 05/10/2020   Hypothyroidism 05/07/2020   Type 2 diabetes mellitus with diabetic chronic kidney disease (HCC) 05/07/2020   COPD (chronic obstructive pulmonary disease) (HCC) 05/07/2020   Hypertension associated with  diabetes (HCC) 05/07/2020   GERD (gastroesophageal reflux disease) 05/07/2020   Neuropathy 05/07/2020   Chronic low back pain 05/07/2020   Current moderate episode of major depressive disorder without prior episode (HCC) 05/07/2020   Tobacco abuse 05/07/2020    Allergies:  Allergies  Allergen Reactions   Semaglutide  Diarrhea    Rybelsus  tablet   Metformin Nausea And Vomiting   Medications:  Current Outpatient Medications:    albuterol  (VENTOLIN  HFA) 108 (90 Base) MCG/ACT inhaler, Inhale 1-2 puffs into the lungs every 6 (six) hours as needed for shortness of breath or wheezing., Disp: 1 each, Rfl: 6   amitriptyline  (ELAVIL ) 25 MG tablet, TAKE 1 TABLET BY MOUTH EVERY DAY AT BEDTIME *PLEASE CALL OFFICE AND SCHEDULE APPT FOR FURTHER REFILLS: *REFILL REQUEST*, Disp: 15 tablet, Rfl: 10   atenolol  (TENORMIN ) 25 MG tablet, Take 1 tablet by mouth once daily, Disp: 90 tablet, Rfl: 1   Continuous Blood Gluc Receiver (DEXCOM G7 RECEIVER) DEVI, 1 each by Does not apply route daily. DX E11.22, Disp: 1 each, Rfl: 2   Continuous Glucose Sensor (DEXCOM G7 SENSOR) MISC, CHANGE EVERY 10 DAYS, Disp: 3 each, Rfl: 3   Dulaglutide  (TRULICITY ) 3 MG/0.5ML SOAJ, Inject 3 mg into the skin once a week., Disp: 6 mL, Rfl: 1   empagliflozin  (JARDIANCE ) 25 MG TABS tablet, Take 1 tablet by mouth once daily, Disp: 90 tablet, Rfl: 1   glipiZIDE  (GLUCOTROL  XL) 5 MG 24 hr tablet, Take 1 tablet (5 mg total) by mouth daily with breakfast., Disp: 90 tablet, Rfl: 1   ibuprofen  (ADVIL ) 800 MG tablet, TAKE 1 TABLET BY MOUTH EVERY 8 HOURS AS NEEDED, Disp: 30 tablet, Rfl: 0   levocetirizine (XYZAL ) 5 MG tablet, Take 1 tablet (5 mg total) by mouth every evening., Disp: 90 tablet, Rfl: 2   levothyroxine  (SYNTHROID ) 100 MCG tablet, TAKE 1 TABLET BY MOUTH ONCE DAILY BEFORE BREAKFAST, Disp: 90 tablet, Rfl: 1   lisinopril  (ZESTRIL ) 10 MG tablet, Take 1 tablet (10 mg total) by mouth daily., Disp: 90 tablet, Rfl: 1   Melatonin 10 MG TABS,  Take 10 mg by mouth at bedtime., Disp: , Rfl:    montelukast  (SINGULAIR ) 10 MG tablet, Take 1 tablet (10 mg total) by mouth at bedtime., Disp: 90 tablet, Rfl: 1   omeprazole  (PRILOSEC) 40 MG capsule, Take 1 capsule by mouth twice daily, Disp: 180 capsule, Rfl: 0   rosuvastatin  (CRESTOR ) 20 MG tablet, Take 1 tablet by mouth once daily, Disp: 90 tablet, Rfl: 1   SUMAtriptan  (IMITREX ) 100 MG tablet, Take 1 tablet (100 mg total) by mouth every 2 (two) hours as needed for migraine. May repeat in 2 hours if headache persists or recurs., Disp: 10 tablet, Rfl: 0   Tiotropium Bromide  Monohydrate 2.5 MCG/ACT AERS, Inhale 2 puffs into the lungs daily., Disp: 3 each, Rfl: 2  Observations/Objective: Patient is well-developed, well-nourished in no acute distress.  Resting comfortably  at home.  Head is normocephalic, atraumatic.  No labored breathing.  Speech is clear and coherent with logical content.  Patient is alert and oriented at baseline.    Assessment and Plan: There are no diagnoses linked to this encounter.  Increase fluids, take atb with food and probiotics, UC if sx persist or worsen.  Follow Up Instructions: I discussed the assessment and treatment plan with the patient. The patient was provided an opportunity to ask questions and all were answered. The patient agreed with the plan and demonstrated an understanding of the instructions.  A copy of instructions were sent to the patient via MyChart unless otherwise noted below.     The patient was advised to call back or seek an in-person evaluation if the symptoms worsen or if the condition fails to improve as anticipated.    Milley Vining, FNP

## 2023-11-20 NOTE — Telephone Encounter (Signed)
 FYI Only or Action Required?: FYI only for provider.  Patient was last seen in primary care on 10/09/2023 by Dorothe Gaster, NP. Called Nurse Triage reporting Headache.  Triage Disposition: See HCP Within 4 Hours (Or PCP Triage)  Patient/caregiver understands and will follow disposition?: Yes                           Copied from CRM [id number]. Topic: Clinical - Red Word Triage >> Nov 20, 2023  1:23 PM Allyne Areola wrote: Red Word that prompted transfer to Nurse Triage: Patient is calling because is experiencing Rhinosinusitis for the past two day, sore throat, coughing, temperature changes, congestion. She has not been able to sleep at all. Reason for Disposition . [1] MILD difficulty breathing (e.g., minimal/no SOB at rest, SOB with walking, pulse <100) AND [2] still present when not coughing  Answer Assessment - Initial Assessment Questions Patient scheduled for a virtual office visit today as no appointment availability in office.   Headache Sore throat Productive cough- white/yellow mucus  Chest pain with a deep breath (5/10 pain level) Ribs hurt from coughing A little difficulty breathing Patient thinks she has rhinosinusitis; had it before and was given an abx Denies current fever Nauseous Was having hot flashes with chills  Protocols used: Cough - Acute Productive-A-AH

## 2023-11-23 NOTE — Telephone Encounter (Signed)
 Noted

## 2023-12-03 ENCOUNTER — Other Ambulatory Visit: Payer: Self-pay | Admitting: Nurse Practitioner

## 2023-12-03 DIAGNOSIS — G8929 Other chronic pain: Secondary | ICD-10-CM

## 2023-12-11 ENCOUNTER — Other Ambulatory Visit: Payer: Self-pay | Admitting: Nurse Practitioner

## 2023-12-11 DIAGNOSIS — I152 Hypertension secondary to endocrine disorders: Secondary | ICD-10-CM

## 2024-01-01 ENCOUNTER — Ambulatory Visit: Admitting: Nurse Practitioner

## 2024-01-01 VITALS — BP 108/68 | HR 73 | Temp 97.7°F | Ht 67.0 in | Wt 226.2 lb

## 2024-01-01 DIAGNOSIS — Z794 Long term (current) use of insulin: Secondary | ICD-10-CM

## 2024-01-01 DIAGNOSIS — R5383 Other fatigue: Secondary | ICD-10-CM | POA: Diagnosis not present

## 2024-01-01 DIAGNOSIS — L409 Psoriasis, unspecified: Secondary | ICD-10-CM

## 2024-01-01 DIAGNOSIS — F322 Major depressive disorder, single episode, severe without psychotic features: Secondary | ICD-10-CM

## 2024-01-01 DIAGNOSIS — J449 Chronic obstructive pulmonary disease, unspecified: Secondary | ICD-10-CM

## 2024-01-01 DIAGNOSIS — E1165 Type 2 diabetes mellitus with hyperglycemia: Secondary | ICD-10-CM | POA: Diagnosis not present

## 2024-01-01 DIAGNOSIS — Z7985 Long-term (current) use of injectable non-insulin antidiabetic drugs: Secondary | ICD-10-CM

## 2024-01-01 LAB — CBC
HCT: 53.1 % — ABNORMAL HIGH (ref 36.0–46.0)
Hemoglobin: 17.3 g/dL — ABNORMAL HIGH (ref 12.0–15.0)
MCHC: 32.5 g/dL (ref 30.0–36.0)
MCV: 90.1 fl (ref 78.0–100.0)
Platelets: 138 K/uL — ABNORMAL LOW (ref 150.0–400.0)
RBC: 5.89 Mil/uL — ABNORMAL HIGH (ref 3.87–5.11)
RDW: 15.3 % (ref 11.5–15.5)
WBC: 5.8 K/uL (ref 4.0–10.5)

## 2024-01-01 LAB — BASIC METABOLIC PANEL WITH GFR
BUN: 19 mg/dL (ref 6–23)
CO2: 27 meq/L (ref 19–32)
Calcium: 9.5 mg/dL (ref 8.4–10.5)
Chloride: 103 meq/L (ref 96–112)
Creatinine, Ser: 0.75 mg/dL (ref 0.40–1.20)
GFR: 82.71 mL/min (ref >60.00)
Glucose, Bld: 176 mg/dL — ABNORMAL HIGH (ref 70–99)
Potassium: 3.8 meq/L (ref 3.5–5.1)
Sodium: 141 meq/L (ref 135–145)

## 2024-01-01 LAB — TSH: TSH: 0.59 u[IU]/mL (ref 0.35–5.50)

## 2024-01-01 LAB — VITAMIN B12: Vitamin B-12: 355 pg/mL (ref 211–911)

## 2024-01-01 LAB — POCT GLYCOSYLATED HEMOGLOBIN (HGB A1C): Hemoglobin A1C: 7.3 % — AB (ref 4.0–5.6)

## 2024-01-01 LAB — VITAMIN D 25 HYDROXY (VIT D DEFICIENCY, FRACTURES): VITD: 35.92 ng/mL (ref 30.00–100.00)

## 2024-01-01 MED ORDER — DULOXETINE HCL 30 MG PO CPEP: 30.0000 mg | ORAL_CAPSULE | Freq: Every day | ORAL | 1 refills | Status: DC

## 2024-01-01 MED ORDER — DEXCOM G7 SENSOR MISC: 3 refills | Status: DC

## 2024-01-01 MED ORDER — TRIAMCINOLONE ACETONIDE 0.1 % EX CREA: 1.0000 | TOPICAL_CREAM | Freq: Two times a day (BID) | CUTANEOUS | 0 refills | Status: AC

## 2024-01-01 MED ORDER — TIOTROPIUM BROMIDE MONOHYDRATE 2.5 MCG/ACT IN AERS: 2.0000 | INHALATION_SPRAY | Freq: Every day | RESPIRATORY_TRACT | 5 refills | Status: AC

## 2024-01-01 NOTE — Assessment & Plan Note (Signed)
 Triamcinolone  0.1% cream twice daily for 7 to 10 days.  Topical steroid precautions reviewed

## 2024-01-01 NOTE — Assessment & Plan Note (Signed)
 Ambiguous in nature.  Patient does have a history of sleep apnea which is not treated.  PHQ-9 and GAD-7 were positive today.  Patient has no HI/SI/AVH.  Will check basic labs along with starting patient on duloxetine 30 mg daily.  If labs normal and no improvement after depression is treated consider referral to pulmonology for further evaluation and treatment of sleep apnea.

## 2024-01-01 NOTE — Progress Notes (Signed)
 Established Patient Office Visit  Subjective   Patient ID: Beda Dula, female    DOB: Oct 23, 1955  Age: 68 y.o. MRN: [id number]  Chief Complaint  Patient presents with   Diabetes    Pt complains of being more tired than normal. States of several naps throughout the day but still sleepy.     HPI 7.3  DM2: Patient currently maintained on Trulicity  3 mg weekly, Jardiance  25 mg, glipizide  5 mg.  Patient does have a continuous glucose monitor to monitor her sugar levels at home.  Of note patient is on ACE and statin therapy.  Patient's last A1c was 7.2% States that she has been cheating some. States that her daughter in law works in a bakery and she has been getting samples.  She does mention that she will get hypoglycemia on her injection today if she takes glipizide  so she will get glipizide  on the Trulicity  day  HTN: Patient currently maintained on lisinopril  10 mg daily.  Daytime sleepiness: does have a history of sleep apena. States that she is getting up 3-4 times a night for urinating. When she does get good sleep she will take several naps a day. States that she does not care. States that she does have a CPAP and cannot tolerate it and has not used it in a couple of years      01/01/2024   10:27 AM 10/09/2023    9:38 AM 07/17/2023    9:58 AM  PHQ9 SCORE ONLY  PHQ-9 Total Score 16 19 9        01/01/2024   10:27 AM 10/09/2023    9:39 AM 07/17/2023    9:58 AM 04/10/2023    9:10 AM  GAD 7 : Generalized Anxiety Score  Nervous, Anxious, on Edge 2 3 0 0  Control/stop worrying 2 3 0 0  Worry too much - different things 2 2 0 0  Trouble relaxing 3 3 1 1   Restless 2 2 0 0  Easily annoyed or irritable 2 3 1  0  Afraid - awful might happen 1 3 0 0  Total GAD 7 Score 14 19 2 1   Anxiety Difficulty Somewhat difficult Very difficult Not difficult at all         Review of Systems  Constitutional:  Positive for malaise/fatigue. Negative for chills and fever.  Respiratory:  Negative for  shortness of breath.   Cardiovascular:  Negative for chest pain.  Neurological:  Negative for headaches.  Psychiatric/Behavioral:  Negative for hallucinations and suicidal ideas. The patient does not have insomnia.       Objective:     BP 108/68   Pulse 73   Temp 97.7 F (36.5 C) (Oral)   Ht 5' 7 (1.702 m)   Wt 226 lb 3.2 oz (102.6 kg)   SpO2 94%   BMI 35.43 kg/m  BP Readings from Last 3 Encounters:  01/01/24 108/68  10/28/23 129/78  10/09/23 118/68   Wt Readings from Last 3 Encounters:  01/01/24 226 lb 3.2 oz (102.6 kg)  10/28/23 232 lb (105.2 kg)  10/09/23 232 lb 6 oz (105.4 kg)   SpO2 Readings from Last 3 Encounters:  01/01/24 94%  10/09/23 96%  07/17/23 95%      Physical Exam Vitals and nursing note reviewed.  Constitutional:      Appearance: Normal appearance.  Cardiovascular:     Rate and Rhythm: Normal rate and regular rhythm.     Heart sounds: Normal heart sounds.  Pulmonary:  Effort: Pulmonary effort is normal.     Breath sounds: Normal breath sounds.  Neurological:     Mental Status: She is alert.      Results for orders placed or performed in visit on 01/01/24  POCT glycosylated hemoglobin (Hb A1C)  Result Value Ref Range   Hemoglobin A1C 7.3 (A) 4.0 - 5.6 %   HbA1c POC (<> result, manual entry)     HbA1c, POC (prediabetic range)     HbA1c, POC (controlled diabetic range)        The ASCVD Risk score (Arnett DK, et al., 2019) failed to calculate for the following reasons:   Unable to determine if patient is Non-Hispanic African American    Assessment & Plan:   Problem List Items Addressed This Visit       Respiratory   COPD (chronic obstructive pulmonary disease) (HCC)   Relevant Medications   Tiotropium Bromide  Monohydrate 2.5 MCG/ACT AERS     Endocrine   Type 2 diabetes mellitus with hyperglycemia, with long-term current use of insulin  (HCC) - Primary   Patient currently maintained on Trulicity  3 mg weekly, Jardiance  25 mg  daily, glipizide  5 mg daily.  A1c is 7.3%.  Will not increase any medication at this juncture patient over the last modifications inclusive of diet      Relevant Medications   Continuous Glucose Sensor (DEXCOM G7 SENSOR) MISC   DULoxetine (CYMBALTA) 30 MG capsule   Other Relevant Orders   POCT glycosylated hemoglobin (Hb A1C) (Completed)   CBC   Basic metabolic panel with GFR     Musculoskeletal and Integument   Psoriasis   Triamcinolone  0.1% cream twice daily for 7 to 10 days.  Topical steroid precautions reviewed      Relevant Medications   triamcinolone  cream (KENALOG ) 0.1 %     Other   Fatigue   Ambiguous in nature.  Patient does have a history of sleep apnea which is not treated.  PHQ-9 and GAD-7 were positive today.  Patient has no HI/SI/AVH.  Will check basic labs along with starting patient on duloxetine 30 mg daily.  If labs normal and no improvement after depression is treated consider referral to pulmonology for further evaluation and treatment of sleep apnea.      Relevant Orders   CBC   Vitamin B12   TSH   VITAMIN D 25 Hydroxy (Vit-D Deficiency, Fractures)   Current severe episode of major depressive disorder without psychotic features (HCC)   History of the same.  Will treat with duloxetine 30 mg daily.  Patient denies HI/SI/AVH.      Relevant Medications   DULoxetine (CYMBALTA) 30 MG capsule    Return in about 3 months (around 04/02/2024) for DM recheck.    Adina Crandall, NP

## 2024-01-01 NOTE — Patient Instructions (Signed)
 Nice to see you today I will be in touch with the labs once I have reviewed them  Follow up with me in 3 months, sooner if you need me

## 2024-01-01 NOTE — Assessment & Plan Note (Signed)
 History of the same.  Will treat with duloxetine 30 mg daily.  Patient denies HI/SI/AVH.

## 2024-01-01 NOTE — Assessment & Plan Note (Signed)
 Patient currently maintained on Trulicity  3 mg weekly, Jardiance  25 mg daily, glipizide  5 mg daily.  A1c is 7.3%.  Will not increase any medication at this juncture patient over the last modifications inclusive of diet

## 2024-01-05 ENCOUNTER — Ambulatory Visit: Payer: Self-pay | Admitting: Nurse Practitioner

## 2024-01-15 ENCOUNTER — Ambulatory Visit: Admitting: Nurse Practitioner

## 2024-01-22 ENCOUNTER — Ambulatory Visit

## 2024-01-28 ENCOUNTER — Other Ambulatory Visit: Payer: Self-pay | Admitting: Nurse Practitioner

## 2024-01-28 DIAGNOSIS — T7840XA Allergy, unspecified, initial encounter: Secondary | ICD-10-CM

## 2024-02-12 ENCOUNTER — Telehealth: Payer: Self-pay | Admitting: Nurse Practitioner

## 2024-02-12 NOTE — Telephone Encounter (Signed)
 Patient was started on duloxetine  30 mg daily and we will see how her fatigue and daytime sleepiness is.

## 2024-02-12 NOTE — Telephone Encounter (Signed)
-----   Message from [facility] sent at 01/01/2024 12:08 PM EDT ----- Regarding: Fatigue Patient was started on duloxetine  30 mg daily and we will see how her fatigue and daytime sleepiness is.

## 2024-02-15 NOTE — Telephone Encounter (Signed)
 Spoke with pt.  Pt complains that she has been doing better.  States she still has slight daytime sleepiness/fatigue but its not as bad anymore.

## 2024-02-22 ENCOUNTER — Telehealth: Payer: Self-pay

## 2024-02-22 ENCOUNTER — Other Ambulatory Visit (HOSPITAL_COMMUNITY): Payer: Self-pay

## 2024-02-22 NOTE — Telephone Encounter (Signed)
 Pharmacy Patient Advocate Encounter   Received notification from Onbase that prior authorization for Dexcom G7 sensors is required/requested.   Insurance verification completed.   The patient is insured through [facility] .   Per test claim: PA required; PA submitted to above mentioned insurance via Latent Key/confirmation #/EOC [id number] Status is pending

## 2024-02-23 ENCOUNTER — Other Ambulatory Visit (HOSPITAL_COMMUNITY): Payer: Self-pay

## 2024-02-23 NOTE — Telephone Encounter (Signed)
 Copied from CRM [id number]. Topic: Clinical - Medication Prior Auth >> Feb 23, 2024  9:09 AM Franky GRADE wrote: Reason for CRM: Patient is calling because she received a call from [facility] that the Dexcom G7 Sensor has been denied. Ref Number [id number].

## 2024-02-23 NOTE — Telephone Encounter (Signed)
 Pharmacy Patient Advocate Encounter  Received notification from [employer] that Prior Authorization for Dexcom G7 sensors has been DENIED.  Full denial letter will be uploaded to the media tab. See denial reason below.     PA #/Case ID/Reference #: # [id number]    If we can get more chart notes of her hypoglycemic episodes, we could resubmit.

## 2024-02-23 NOTE — Telephone Encounter (Signed)
 We can let the patient know they are denying it because she is not on insulin 

## 2024-02-25 ENCOUNTER — Other Ambulatory Visit (HOSPITAL_COMMUNITY): Payer: Self-pay

## 2024-03-01 ENCOUNTER — Encounter: Payer: Self-pay | Admitting: Nurse Practitioner

## 2024-03-01 DIAGNOSIS — Z794 Long term (current) use of insulin: Secondary | ICD-10-CM

## 2024-03-03 NOTE — Telephone Encounter (Signed)
 See about hypoglycemia with current use of dexcom. Can we resubmit or appeal please

## 2024-03-04 ENCOUNTER — Other Ambulatory Visit (HOSPITAL_COMMUNITY): Payer: Self-pay

## 2024-03-07 MED ORDER — DEXCOM G7 SENSOR MISC: 3 refills | Status: DC

## 2024-03-07 NOTE — Addendum Note (Signed)
 Addended by: WENDEE LYNWOOD HERO on: 03/07/2024 02:02 PM   Modules accepted: Orders

## 2024-03-08 ENCOUNTER — Other Ambulatory Visit (HOSPITAL_COMMUNITY): Payer: Self-pay

## 2024-03-10 ENCOUNTER — Ambulatory Visit (INDEPENDENT_AMBULATORY_CARE_PROVIDER_SITE_OTHER)

## 2024-03-10 VITALS — Ht 67.0 in | Wt 226.0 lb

## 2024-03-10 DIAGNOSIS — Z0000 Encounter for general adult medical examination without abnormal findings: Secondary | ICD-10-CM | POA: Diagnosis not present

## 2024-03-10 NOTE — Progress Notes (Signed)
 Subjective:   Adja Ruff is a 68 y.o. who presents for a Medicare Wellness preventive visit.  As a reminder, Annual Wellness Visits don't include a physical exam, and some assessments may be limited, especially if this visit is performed virtually. We may recommend an in-person follow-up visit with your provider if needed.  Visit Complete: Virtual I connected with  Litsy Bertsch on 03/10/24 by a audio enabled telemedicine application and verified that I am speaking with the correct person using two identifiers.  Patient Location: Home  Provider Location: Office/Clinic  I discussed the limitations of evaluation and management by telemedicine. The patient expressed understanding and agreed to proceed.  Vital Signs: Because this visit was a virtual/telehealth visit, some criteria may be missing or patient reported. Any vitals not documented were not able to be obtained and vitals that have been documented are patient reported.  VideoDeclined- This patient declined Librarian, academic. Therefore the visit was completed with audio only.  Persons Participating in Visit: Patient.  AWV Questionnaire: Yes: Patient Medicare AWV questionnaire was completed by the patient on 03/10/2024; I have confirmed that all information answered by patient is correct and no changes since this date.  Cardiac Risk Factors include: advanced age (>63men, >78 women);hypertension;diabetes mellitus;Other (see comment);obesity (BMI >30kg/m2);dyslipidemia;smoking/ tobacco exposure, Risk factor comments: COPD     Objective:    Today's Vitals   03/10/24 0854  Weight: 226 lb (102.5 kg)  Height: 5' 7 (1.702 m)   Body mass index is 35.4 kg/m.     03/10/2024    9:04 AM 12/13/2021    1:39 PM 02/27/2021    3:20 AM 02/26/2021    8:08 AM 02/22/2021   10:20 AM 08/23/2020    8:51 AM 08/17/2020    5:14 PM  Advanced Directives  Does Patient Have a Medical Advance Directive? No No  No No No No   Would patient like information on creating a medical advance directive?   No - Patient declined  No - Patient declined No - Patient declined No - Patient declined    Current Medications (verified) Outpatient Encounter Medications as of 03/10/2024  Medication Sig   albuterol  (VENTOLIN  HFA) 108 (90 Base) MCG/ACT inhaler Inhale 1-2 puffs into the lungs every 6 (six) hours as needed for shortness of breath or wheezing.   amitriptyline  (ELAVIL ) 25 MG tablet TAKE 1 TABLET BY MOUTH EVERY DAY AT BEDTIME *PLEASE CALL OFFICE AND SCHEDULE APPT FOR FURTHER REFILLS: *REFILL REQUEST*   atenolol  (TENORMIN ) 25 MG tablet Take 1 tablet by mouth once daily   Continuous Blood Gluc Receiver (DEXCOM G7 RECEIVER) DEVI 1 each by Does not apply route daily. DX E11.22   Continuous Glucose Sensor (DEXCOM G7 SENSOR) MISC CHANGE EVERY 10 DAYS   Dulaglutide  (TRULICITY ) 3 MG/0.5ML SOAJ Inject 3 mg into the skin once a week.   DULoxetine  (CYMBALTA ) 30 MG capsule Take 1 capsule (30 mg total) by mouth daily.   empagliflozin  (JARDIANCE ) 25 MG TABS tablet Take 1 tablet by mouth once daily   glipiZIDE  (GLUCOTROL  XL) 5 MG 24 hr tablet Take 1 tablet (5 mg total) by mouth daily with breakfast.   ibuprofen  (ADVIL ) 800 MG tablet TAKE 1 TABLET BY MOUTH EVERY 8 HOURS AS NEEDED   levothyroxine  (SYNTHROID ) 100 MCG tablet TAKE 1 TABLET BY MOUTH ONCE DAILY BEFORE BREAKFAST   lisinopril  (ZESTRIL ) 10 MG tablet Take 1 tablet by mouth once daily   Melatonin 10 MG TABS Take 10 mg by mouth at bedtime.  omeprazole  (PRILOSEC) 40 MG capsule Take 1 capsule by mouth twice daily   rosuvastatin  (CRESTOR ) 20 MG tablet Take 1 tablet by mouth once daily   SUMAtriptan  (IMITREX ) 100 MG tablet Take 1 tablet (100 mg total) by mouth every 2 (two) hours as needed for migraine. May repeat in 2 hours if headache persists or recurs.   Tiotropium Bromide  Monohydrate 2.5 MCG/ACT AERS Inhale 2 puffs into the lungs daily.   triamcinolone  cream (KENALOG ) 0.1 % Apply 1  Application topically 2 (two) times daily.   levocetirizine (XYZAL ) 5 MG tablet Take 1 tablet (5 mg total) by mouth every evening. (Patient not taking: Reported on 03/10/2024)   montelukast  (SINGULAIR ) 10 MG tablet TAKE 1 TABLET BY MOUTH AT BEDTIME   No facility-administered encounter medications on file as of 03/10/2024.    Allergies (verified) Semaglutide  and Metformin   History: Past Medical History:  Diagnosis Date   Anemia    Anxiety    Asthma    COPD (chronic obstructive pulmonary disease) (HCC)    Depression    Diabetes mellitus without complication (HCC)    Endometriosis    Gastric ulcer    GERD (gastroesophageal reflux disease)    Hypertension    Hypothyroidism    Sleep apnea    Thyroid  disease    Past Surgical History:  Procedure Laterality Date   HYSTEROSCOPY WITH D & C N/A 08/23/2020   Procedure: DILATATION AND CURETTAGE /HYSTEROSCOPY, polypectomy;  Surgeon: Victor Claudell SAUNDERS, MD;  Location: [facility] ORS;  Service: Gynecology;  Laterality: N/A;   TUBAL LIGATION     VAGINAL HYSTERECTOMY Bilateral 02/26/2021   Procedure: HYSTERECTOMY VAGINAL;  Surgeon: Lorence Ozell CROME, MD;  Location: [facility] OR;  Service: Gynecology;  Laterality: Bilateral;   Family History  Problem Relation Age of Onset   Alcohol abuse Mother    Arthritis Mother    Asthma Mother    COPD Mother    Depression Mother    Drug abuse Mother    Early death Mother    Hyperlipidemia Mother    Hypertension Mother    Alcohol abuse Father    Cancer Father        lung   Multiple sclerosis Maternal Aunt    Social History   Socioeconomic History   Marital status: Widowed    Spouse name: Not on file   Number of children: Not on file   Years of education: Not on file   Highest education level: GED or equivalent  Occupational History   Occupation: Disablity  Tobacco Use   Smoking status: Every Day    Current packs/day: 0.50    Average packs/day: 0.5 packs/day for 52.8 years (26.4 ttl pk-yrs)     Types: Cigarettes    Start date: 1973   Smokeless tobacco: Never   Tobacco comments:    2 ppd previously, cut back 2021  Vaping Use   Vaping status: Never Used  Substance and Sexual Activity   Alcohol use: Never   Drug use: Never   Sexual activity: Not Currently  Other Topics Concern   Not on file  Social History Narrative   Granddaughter lives with her/2025      05/07/20   From: [place], [place]. Moved in 2017    Living: with husband, Deward (2005)   Work: disability due to back pain, [employer] prior to this      Family: son - Chyrl (in [place]) , [place] ([place]), [place] (SIDS death), Marcelline ([place]) - 3 grandchildren (one passed away)  Enjoys: short attention span, computer, knitting      Exercise: walking the dog   Diet: not currently, does not follow diabetic diet      Safety   Seat belts: Yes    Guns: No   Safe in relationships: Yes    Social Drivers of Corporate investment banker Strain: Low Risk  (03/10/2024)   Overall Financial Resource Strain (CARDIA)    Difficulty of Paying Living Expenses: Not very hard  Food Insecurity: No Food Insecurity (03/10/2024)   Hunger Vital Sign    Worried About Running Out of Food in the Last Year: Never true    Ran Out of Food in the Last Year: Never true  Transportation Needs: No Transportation Needs (03/10/2024)   PRAPARE - Administrator, Civil Service (Medical): No    Lack of Transportation (Non-Medical): No  Physical Activity: Inactive (03/10/2024)   Exercise Vital Sign    Days of Exercise per Week: 0 days    Minutes of Exercise per Session: 0 min  Stress: No Stress Concern Present (03/10/2024)   [employer] of Occupational Health - Occupational Stress Questionnaire    Feeling of Stress: Not at all  Social Connections: Socially Isolated (03/10/2024)   Social Connection and Isolation Panel    Frequency of Communication with Friends and Family: More than three times a week    Frequency of Social Gatherings with Friends and  Family: More than three times a week    Attends Religious Services: Never    Database administrator or Organizations: No    Attends Banker Meetings: Never    Marital Status: Widowed    Tobacco Counseling Ready to quit: Not Answered Counseling given: Not Answered Tobacco comments: 2 ppd previously, cut back 2021    Clinical Intake:  Pre-visit preparation completed: Yes  Pain : No/denies pain     BMI - recorded: 35.4 Nutritional Status: BMI > 30  Obese Diabetes: Yes CBG done?: No Did pt. bring in CBG monitor from home?: No  Lab Results  Component Value Date   HGBA1C 7.3 (A) 01/01/2024   HGBA1C 7.2 (A) 07/17/2023   HGBA1C 7.3 (A) 04/10/2023     How often do you need to have someone help you when you read instructions, pamphlets, or other written materials from your doctor or pharmacy?: 1 - Never  Interpreter Needed?: No  Information entered by :: Madalyn Legner, RMA   Activities of Daily Living     03/10/2024    8:31 AM  In your present state of health, do you have any difficulty performing the following activities:  Hearing? 0  Difficulty concentrating or making decisions? 0  Walking or climbing stairs? 0  Dressing or bathing? 0  Doing errands, shopping? 0  Comment son drives her around  Preparing Food and eating ? N  Using the Toilet? N  In the past six months, have you accidently leaked urine? N  Do you have problems with loss of bowel control? N  Managing your Medications? N  Managing your Finances? N  Housekeeping or managing your Housekeeping? N    Patient Care Team: Wendee Lynwood HERO, NP as PCP - General (Nurse Practitioner) Fate Morna SAILOR, [employer] (Inactive) as Pharmacist (Pharmacist) [facility] [place] [facility]  I have updated your Care Teams any recent Medical Services you may have received from other providers in the past year.     Assessment:   This is a routine wellness examination for  Nena.  Hearing/Vision  screen Hearing Screening - Comments:: Denies hearing difficulties     Goals Addressed             This Visit's Progress    Patient Stated        quit smoking and losing weight   Still working on it/2025       Depression Screen     03/10/2024    9:07 AM 01/01/2024   10:27 AM 10/09/2023    9:38 AM 07/17/2023    9:58 AM 04/10/2023    9:08 AM 01/09/2023    9:49 AM 10/03/2022   10:05 AM  PHQ 2/9 Scores  PHQ - 2 Score 5 4 5 3  0 4 2  PHQ- 9 Score 6 16 19 9 3 14 12     Fall Risk     03/10/2024    8:31 AM 01/01/2024    9:50 AM 10/09/2023    9:38 AM 07/17/2023    9:59 AM 04/10/2023    9:10 AM  Fall Risk   Falls in the past year? 1 1 1 1 1   Number falls in past yr: 1 0 0 0 0  Injury with Fall? 1 0 1 1   Risk for fall due to :  No Fall Risks  Other (Comment) No Fall Risks  Follow up Falls evaluation completed;Falls prevention discussed Falls evaluation completed  Falls evaluation completed Falls evaluation completed    MEDICARE RISK AT HOME:  Medicare Risk at Home Any stairs in or around the home?: (Patient-Rptd) No If so, are there any without handrails?: (Patient-Rptd) No Home free of loose throw rugs in walkways, pet beds, electrical cords, etc?: (Patient-Rptd) No Adequate lighting in your home to reduce risk of falls?: (Patient-Rptd) Yes Life alert?: (Patient-Rptd) No Use of a cane, walker or w/c?: (Patient-Rptd) No Grab bars in the bathroom?: (Patient-Rptd) No Shower chair or bench in shower?: (Patient-Rptd) No Elevated toilet seat or a handicapped toilet?: (Patient-Rptd) No  TIMED UP AND GO:  Was the test performed?  No  Cognitive Function: Declined/Normal: No cognitive concerns noted by patient or family. Patient alert, oriented, able to answer questions appropriately and recall recent events. No signs of memory loss or confusion.        12/13/2021    1:43 PM  6CIT Screen  What Year? 0 points  What month? 0 points  What time? 0 points  Count back from 20 0 points   Months in reverse 0 points  Repeat phrase 2 points  Total Score 2 points    Immunizations Immunization History  Administered Date(s) Administered   Influenza,inj,Quad PF,6+ Mos 02/22/2020, 03/25/2021, 02/17/2022   Janssen (J&J) SARS-COV-2 Vaccination 10/01/2019   PFIZER Comirnaty(Gray Top)Covid-19 Tri-Sucrose Vaccine 05/17/2020   PFIZER(Purple Top)SARS-COV-2 Vaccination 04/06/2021   Pfizer(Comirnaty)Fall Seasonal Vaccine 12 years and older 05/24/2022   Pneumococcal Polysaccharide-23 05/24/2022   Tdap 05/07/2020   Zoster Recombinant(Shingrix) 01/02/2019    Screening Tests Health Maintenance  Topic Date Due   Diabetic kidney evaluation - Urine ACR  Never done   Mammogram  Never done   Lung Cancer Screening  Never done   Zoster Vaccines- Shingrix (2 of 2) 02/27/2019   DEXA SCAN  Never done   Colonoscopy  10/15/2022   Medicare Annual Wellness (AWV)  12/14/2022   Pneumococcal Vaccine: 50+ Years (2 of 2 - PCV) 05/25/2023   Influenza Vaccine  01/01/2024   COVID-19 Vaccine (5 - 2025-26 season) 02/01/2024   OPHTHALMOLOGY EXAM  04/13/2024   HEMOGLOBIN  A1C  07/03/2024   FOOT EXAM  07/16/2024   Diabetic kidney evaluation - eGFR measurement  12/31/2024   DTaP/Tdap/Td (2 - Td or Tdap) 05/07/2030   Hepatitis C Screening  Completed   Meningococcal B Vaccine  Aged Out    Health Maintenance Items Addressed: Vaccines Due: flu and pneumonia, DEXA ordered, Labs Due UACR, See Nurse Notes at the end of this note  Additional Screening:  Vision Screening: Recommended annual ophthalmology exams for early detection of glaucoma and other disorders of the eye. Is the patient up to date with their annual eye exam?  Yes  Who is the provider or what is the name of the office in which the patient attends annual eye exams? [facility]  Dental Screening: Recommended annual dental exams for proper oral hygiene  Community Resource Referral / Chronic Care Management: CRR required  this visit?  No   CCM required this visit?  No   Plan:    I have personally reviewed and noted the following in the patient's chart:   Medical and social history Use of alcohol, tobacco or illicit drugs  Current medications and supplements including opioid prescriptions. Patient is not currently taking opioid prescriptions. Functional ability and status Nutritional status Physical activity Advanced directives List of other physicians Hospitalizations, surgeries, and ER visits in previous 12 months Vitals Screenings to include cognitive, depression, and falls Referrals and appointments  In addition, I have reviewed and discussed with patient certain preventive protocols, quality metrics, and best practice recommendations. A written personalized care plan for preventive services as well as general preventive health recommendations were provided to patient.   Jazon Jipson L Yovanni Frenette, CMA   03/10/2024   After Visit Summary: (MyChart) Due to this being a telephonic visit, the after visit summary with patients personalized plan was offered to patient via MyChart   Notes: Patient declines Colonoscopy, lung screening, and mammogram.  She is willing to get a DEXA and Dr. Wendee has placed an order for it in 07/2023.  Patient stated that she would do it if they come out to her.  I informed patient to call to where the order was sent which was [facility].  She is due for a Flu vaccina and pneumonia vaccine.  Patient declines all other due vaccines.  She is due for a UACR and will get that done during her next office visit.

## 2024-03-10 NOTE — Patient Instructions (Signed)
 Ms. Valerie Lamb,  Thank you for taking the time for your Medicare Wellness Visit. I appreciate your continued commitment to your health goals. Please review the care plan we discussed, and feel free to reach out if I can assist you further.  Medicare recommends these wellness visits once per year to help you and your care team stay ahead of potential health issues. These visits are designed to focus on prevention, allowing your provider to concentrate on managing your acute and chronic conditions during your regular appointments.  Please note that Annual Wellness Visits do not include a physical exam. Some assessments may be limited, especially if the visit was conducted virtually. If needed, we may recommend a separate in-person follow-up with your provider.  Ongoing Care Seeing your primary care provider every 3 to 6 months helps us  monitor your health and provide consistent, personalized care. Last office visit on 01/01/2024.  Next office visit on 05/06/2024.  You are due for a Flu vaccine and a pneumonia vaccine, which you can get at a pharmacy.  You can also get during your next office visit with your provider.    Referrals If a referral was made during today's visit and you haven't received any updates within two weeks, please contact the referred provider directly to check on the status.  Recommended Screenings:  Health Maintenance  Topic Date Due   Yearly kidney health urinalysis for diabetes  Never done   Breast Cancer Screening  Never done   Screening for Lung Cancer  Never done   Zoster (Shingles) Vaccine (2 of 2) 02/27/2019   DEXA scan (bone density measurement)  Never done   Colon Cancer Screening  10/15/2022   Medicare Annual Wellness Visit  12/14/2022   Pneumococcal Vaccine for age over 39 (2 of 2 - PCV) 05/25/2023   Flu Shot  01/01/2024   COVID-19 Vaccine (5 - 2025-26 season) 02/01/2024   Eye exam for diabetics  04/13/2024   Hemoglobin A1C  07/03/2024   Complete foot exam    07/16/2024   Yearly kidney function blood test for diabetes  12/31/2024   DTaP/Tdap/Td vaccine (2 - Td or Tdap) 05/07/2030   Hepatitis C Screening  Completed   Meningitis B Vaccine  Aged Out       03/10/2024    9:04 AM  Advanced Directives  Does Patient Have a Medical Advance Directive? No   Advance Care Planning is important because it: Ensures you receive medical care that aligns with your values, goals, and preferences. Provides guidance to your family and loved ones, reducing the emotional burden of decision-making during critical moments.  Vision: Annual vision screenings are recommended for early detection of glaucoma, cataracts, and diabetic retinopathy. These exams can also reveal signs of chronic conditions such as diabetes and high blood pressure.  Dental: Annual dental screenings help detect early signs of oral cancer, gum disease, and other conditions linked to overall health, including heart disease and diabetes.  Please see the attached documents for additional preventive care recommendations.

## 2024-03-11 NOTE — Telephone Encounter (Signed)
 Copied from CRM [id number]. Topic: Clinical - Medication Prior Auth >> Mar 11, 2024  2:40 PM Drema MATSU wrote: Reason for CRM: Patient read online that if provider puts in a prior Authorization request with medicaid they may approve her DEXCOM G7. She states that she is on 3 medications.

## 2024-03-14 ENCOUNTER — Other Ambulatory Visit (HOSPITAL_COMMUNITY): Payer: Self-pay

## 2024-03-14 ENCOUNTER — Encounter: Payer: Self-pay | Admitting: Nurse Practitioner

## 2024-03-14 ENCOUNTER — Encounter: Payer: Self-pay | Admitting: Pharmacist

## 2024-03-14 NOTE — Progress Notes (Signed)
 Chart Review Reason: Drug information Question - Medication Access  Summary: Dexcom G7 = $111.70 for 1 sensor at patient's pharmacy   Rx Insurance: YES [facility] Summary of Benefit: Chart Notes 02/23/24 show Prior Shara has been DENIED    Considerations: Medicare will only cover CGM devices for those prescribed insulin  therapy.  Lowest cash price = Libre 3+ for $75/month which is not affordable for patient.   Manuelita FABIENE Kobs, PharmD Clinical Pharmacist [facility] [phone]

## 2024-03-14 NOTE — Telephone Encounter (Signed)
 Do you know of any resources for the Dexcom CGM?

## 2024-03-23 ENCOUNTER — Telehealth: Payer: Self-pay

## 2024-03-23 NOTE — Telephone Encounter (Signed)
 Not sure which medication this is for. We will probably have to reach out to the pt.

## 2024-03-23 NOTE — Telephone Encounter (Signed)
 Copied from CRM [id number]. Topic: Clinical - Medication Prior Auth >> Mar 23, 2024  8:38 AM Valerie Lamb wrote: Reason for CRM: Patient called and Nat fron [facility] called in regarding prior auth, would like to know why prior shara was denied  [phone]

## 2024-03-24 ENCOUNTER — Encounter: Payer: Self-pay | Admitting: Nurse Practitioner

## 2024-03-24 ENCOUNTER — Other Ambulatory Visit: Payer: Self-pay | Admitting: Nurse Practitioner

## 2024-03-24 DIAGNOSIS — G8929 Other chronic pain: Secondary | ICD-10-CM

## 2024-03-24 DIAGNOSIS — E1165 Type 2 diabetes mellitus with hyperglycemia: Secondary | ICD-10-CM

## 2024-03-24 DIAGNOSIS — E1159 Type 2 diabetes mellitus with other circulatory complications: Secondary | ICD-10-CM

## 2024-03-25 ENCOUNTER — Encounter: Payer: Self-pay | Admitting: Nurse Practitioner

## 2024-03-25 DIAGNOSIS — E1165 Type 2 diabetes mellitus with hyperglycemia: Secondary | ICD-10-CM

## 2024-03-25 MED ORDER — ACCU-CHEK GUIDE ME W/DEVICE KIT: PACK | 0 refills | Status: DC

## 2024-03-25 MED ORDER — BLOOD GLUCOSE TEST VI STRP: 1.0000 | ORAL_STRIP | Freq: Two times a day (BID) | 2 refills | Status: DC

## 2024-03-25 MED ORDER — ACCU-CHEK GUIDE TEST VI STRP: ORAL_STRIP | 5 refills | Status: DC

## 2024-03-25 MED ORDER — ACCU-CHEK SOFTCLIX LANCETS MISC: 5 refills | Status: DC

## 2024-03-25 MED ORDER — BLOOD GLUCOSE MONITORING SUPPL DEVI: 1.0000 | 0 refills | Status: DC

## 2024-03-25 MED ORDER — ACCU-CHEK SOFTCLIX LANCET DEV KIT: PACK | 0 refills | Status: DC

## 2024-03-25 MED ORDER — LANCET DEVICE MISC: 1.0000 | 0 refills | Status: DC

## 2024-03-25 MED ORDER — LANCETS MISC: 1.0000 | Freq: Two times a day (BID) | 2 refills | Status: DC

## 2024-03-28 LAB — HM DEXA SCAN

## 2024-03-31 ENCOUNTER — Encounter: Payer: Self-pay | Admitting: Nurse Practitioner

## 2024-04-04 ENCOUNTER — Encounter: Payer: Self-pay | Admitting: Radiology

## 2024-04-12 ENCOUNTER — Other Ambulatory Visit: Payer: Self-pay | Admitting: Family

## 2024-04-12 ENCOUNTER — Encounter: Payer: Self-pay | Admitting: Nurse Practitioner

## 2024-04-12 ENCOUNTER — Other Ambulatory Visit: Payer: Self-pay | Admitting: Nurse Practitioner

## 2024-04-12 DIAGNOSIS — T7840XA Allergy, unspecified, initial encounter: Secondary | ICD-10-CM

## 2024-04-12 DIAGNOSIS — E039 Hypothyroidism, unspecified: Secondary | ICD-10-CM

## 2024-04-12 DIAGNOSIS — R251 Tremor, unspecified: Secondary | ICD-10-CM

## 2024-04-12 DIAGNOSIS — E1169 Type 2 diabetes mellitus with other specified complication: Secondary | ICD-10-CM

## 2024-04-12 DIAGNOSIS — E1122 Type 2 diabetes mellitus with diabetic chronic kidney disease: Secondary | ICD-10-CM

## 2024-04-12 DIAGNOSIS — J449 Chronic obstructive pulmonary disease, unspecified: Secondary | ICD-10-CM

## 2024-04-13 MED ORDER — TRULICITY 3 MG/0.5ML ~~LOC~~ SOAJ: 3.0000 mg | SUBCUTANEOUS | 1 refills | Status: AC

## 2024-04-13 MED ORDER — ALBUTEROL SULFATE HFA 108 (90 BASE) MCG/ACT IN AERS: 1.0000 | INHALATION_SPRAY | Freq: Four times a day (QID) | RESPIRATORY_TRACT | 1 refills | Status: AC | PRN

## 2024-04-14 ENCOUNTER — Encounter: Payer: Self-pay | Admitting: Nurse Practitioner

## 2024-04-14 DIAGNOSIS — T7840XA Allergy, unspecified, initial encounter: Secondary | ICD-10-CM

## 2024-04-14 MED ORDER — SUMATRIPTAN SUCCINATE 100 MG PO TABS: 100.0000 mg | ORAL_TABLET | ORAL | 0 refills | Status: DC | PRN

## 2024-04-14 MED ORDER — SPIRIVA RESPIMAT 2.5 MCG/ACT IN AERS: 2.0000 | INHALATION_SPRAY | Freq: Every day | RESPIRATORY_TRACT | 5 refills | Status: AC

## 2024-04-14 MED ORDER — MONTELUKAST SODIUM 10 MG PO TABS: 10.0000 mg | ORAL_TABLET | Freq: Every day | ORAL | 0 refills | Status: DC

## 2024-04-14 MED ORDER — OMEPRAZOLE 40 MG PO CPDR: 40.0000 mg | DELAYED_RELEASE_CAPSULE | Freq: Two times a day (BID) | ORAL | 0 refills | Status: DC

## 2024-04-15 ENCOUNTER — Encounter: Payer: Self-pay | Admitting: Nurse Practitioner

## 2024-04-15 LAB — OPHTHALMOLOGY REPORT-SCANNED

## 2024-04-18 ENCOUNTER — Encounter: Payer: Self-pay | Admitting: Nurse Practitioner

## 2024-04-19 ENCOUNTER — Telehealth: Payer: Self-pay

## 2024-04-19 DIAGNOSIS — T7840XA Allergy, unspecified, initial encounter: Secondary | ICD-10-CM

## 2024-04-19 DIAGNOSIS — Z794 Long term (current) use of insulin: Secondary | ICD-10-CM

## 2024-04-19 MED ORDER — LEVOCETIRIZINE DIHYDROCHLORIDE 5 MG PO TABS: 5.0000 mg | ORAL_TABLET | Freq: Every evening | ORAL | 2 refills | Status: AC

## 2024-04-19 MED ORDER — GLIPIZIDE ER 5 MG PO TB24: 5.0000 mg | ORAL_TABLET | Freq: Every day | ORAL | 1 refills | Status: DC

## 2024-04-19 NOTE — Telephone Encounter (Signed)
 LAST APPOINTMENT DATE: 01/01/24   NEXT APPOINTMENT DATE: 05/06/2024  Pt requests for scripts sent to [facility] home delivery   Glipizide  xl  LAST REFILL: 11/02/23 QTY: #90, 1RF   Levocetirizine  LAST REFILL: 05/06/23  QTY: #90, 2RF

## 2024-04-19 NOTE — Telephone Encounter (Signed)
 Refills provided

## 2024-04-19 NOTE — Addendum Note (Signed)
 Addended by: WENDEE LYNWOOD HERO on: 04/19/2024 02:15 PM   Modules accepted: Orders

## 2024-04-26 ENCOUNTER — Telehealth: Payer: Self-pay

## 2024-04-26 DIAGNOSIS — T7840XA Allergy, unspecified, initial encounter: Secondary | ICD-10-CM

## 2024-04-26 DIAGNOSIS — F322 Major depressive disorder, single episode, severe without psychotic features: Secondary | ICD-10-CM

## 2024-04-26 DIAGNOSIS — G8929 Other chronic pain: Secondary | ICD-10-CM

## 2024-04-26 DIAGNOSIS — E1165 Type 2 diabetes mellitus with hyperglycemia: Secondary | ICD-10-CM

## 2024-04-26 NOTE — Telephone Encounter (Signed)
 LAST APPOINTMENT DATE: 01/01/2024 NEXT APPOINTMENT DATE: 05/06/2024  Pt requests for all prescriptions switched to [facility] delivery.  Received requests for:   Montelukast  Omeprazole  Sumatriptan   Ibuprofen  Duloxetine  EC/DR

## 2024-04-27 MED ORDER — DULOXETINE HCL 30 MG PO CPEP: 30.0000 mg | ORAL_CAPSULE | Freq: Every day | ORAL | 1 refills | Status: DC

## 2024-04-27 MED ORDER — OMEPRAZOLE 40 MG PO CPDR: 40.0000 mg | DELAYED_RELEASE_CAPSULE | Freq: Two times a day (BID) | ORAL | 0 refills | Status: AC

## 2024-04-27 MED ORDER — MONTELUKAST SODIUM 10 MG PO TABS: 10.0000 mg | ORAL_TABLET | Freq: Every day | ORAL | 0 refills | Status: DC

## 2024-04-27 MED ORDER — IBUPROFEN 800 MG PO TABS: 800.0000 mg | ORAL_TABLET | Freq: Three times a day (TID) | ORAL | 0 refills | Status: AC | PRN

## 2024-04-27 MED ORDER — SUMATRIPTAN SUCCINATE 100 MG PO TABS: 100.0000 mg | ORAL_TABLET | ORAL | 0 refills | Status: AC | PRN

## 2024-04-27 NOTE — Addendum Note (Signed)
 Addended by: WENDEE LYNWOOD HERO on: 04/27/2024 01:09 PM   Modules accepted: Orders

## 2024-05-06 ENCOUNTER — Ambulatory Visit: Admitting: Nurse Practitioner

## 2024-05-06 VITALS — BP 100/60 | HR 67 | Temp 98.1°F | Ht 67.0 in | Wt 231.0 lb

## 2024-05-06 DIAGNOSIS — Z794 Long term (current) use of insulin: Secondary | ICD-10-CM | POA: Diagnosis not present

## 2024-05-06 DIAGNOSIS — F322 Major depressive disorder, single episode, severe without psychotic features: Secondary | ICD-10-CM

## 2024-05-06 DIAGNOSIS — T7840XA Allergy, unspecified, initial encounter: Secondary | ICD-10-CM

## 2024-05-06 DIAGNOSIS — G4733 Obstructive sleep apnea (adult) (pediatric): Secondary | ICD-10-CM

## 2024-05-06 DIAGNOSIS — E1165 Type 2 diabetes mellitus with hyperglycemia: Secondary | ICD-10-CM

## 2024-05-06 LAB — POCT GLYCOSYLATED HEMOGLOBIN (HGB A1C): Hemoglobin A1C: 7.6 % — AB (ref 4.0–5.6)

## 2024-05-06 MED ORDER — FREESTYLE LIBRE 3 PLUS SENSOR MISC: 1 refills | Status: AC

## 2024-05-06 MED ORDER — MONTELUKAST SODIUM 10 MG PO TABS: 10.0000 mg | ORAL_TABLET | Freq: Every day | ORAL | 2 refills | Status: AC

## 2024-05-06 MED ORDER — DULOXETINE HCL 30 MG PO CPEP: 60.0000 mg | ORAL_CAPSULE | Freq: Every day | ORAL | Status: DC

## 2024-05-06 MED ORDER — FLUTICASONE PROPIONATE 50 MCG/ACT NA SUSP: 2.0000 | Freq: Every day | NASAL | 0 refills | Status: AC

## 2024-05-06 MED ORDER — FREESTYLE LIBRE 3 READER DEVI: 1.0000 | 0 refills | Status: AC

## 2024-05-06 NOTE — Patient Instructions (Signed)
 Increase the Cymbalta  (duloxetine ) to 60mg  daily. Take 2 of the 30mg  capsules at the same time A1C is up work on watching what you eat Follow up with me in 3 months, sooner if you need me  I have sent some nasal spray to the mail order pharmacy to try out. It can cause nosebleeds

## 2024-05-06 NOTE — Progress Notes (Signed)
 Established Patient Office Visit  Subjective   Patient ID: Valerie Lamb, female    DOB: Sep 29, 1955  Age: 68 y.o. MRN: [id number]  Chief Complaint  Patient presents with  . Follow-up    HPI   Discussed the use of AI scribe software for clinical note transcription with the patient, who gave verbal consent to proceed.  History of Present Illness Valerie Lamb is a 68 year old female with type 2 diabetes who presents for management of elevated blood sugar levels.  Her blood sugar levels have increased, with her A1c rising to 7.6 from previous levels of 7.3 and 7.2 over the past year. She attributes this increase to dietary indulgences during the holidays, particularly consuming eggnog and pie. She does not currently have a glucose monitor due to insurance issues and difficulty using test strips because of tremors. Her blood sugar readings, when checked, range between 120 and 130 mg/dL. She is currently on Trulicity  3 mg and Jardiance , and she has been taking glipizide  once daily.  Patient was previously on glipizide  5 mg twice daily but is experiencing hypoglycemia so we switched to once daily dosing  She experiences a lack of appetite, often skipping meals, and relies on [employer] Breakfast to manage hunger. She experiences dizziness and headaches when she does not eat. Despite these symptoms, she lacks motivation to eat or engage in activities she previously enjoyed. She is currently taking duloxetine  (Cymbalta ) but has not noticed any improvement in her motivation or mood. She continues to nap frequently during the day.  She has a history of sleep apnea and has tried CPAP therapy, which she found intolerable due to issues with the mask and her sleeping habits. She has also tried oral appliances without success. Her 100-pound German shepherd sleeps in her bed, which affects her use of CPAP therapy.  She reports nasal congestion and postnasal drip, particularly in the mornings, which  causes her to gag and vomit. She does not currently use any nasal sprays. No fever, chills, chest pain, or new shortness of breath.    Review of Systems  Constitutional:  Negative for chills and fever.  Respiratory:  Negative for shortness of breath.   Cardiovascular:  Negative for chest pain.  Neurological:  Negative for headaches.  Psychiatric/Behavioral:  Negative for hallucinations and suicidal ideas.       Objective:     BP 100/60   Pulse 67   Temp 98.1 F (36.7 C) (Oral)   Ht 5' 7 (1.702 m)   Wt 231 lb (104.8 kg)   SpO2 92%   BMI 36.18 kg/m  BP Readings from Last 3 Encounters:  05/06/24 100/60  01/01/24 108/68  10/28/23 129/78   Wt Readings from Last 3 Encounters:  05/06/24 231 lb (104.8 kg)  03/10/24 226 lb (102.5 kg)  01/01/24 226 lb 3.2 oz (102.6 kg)   SpO2 Readings from Last 3 Encounters:  05/06/24 92%  01/01/24 94%  10/09/23 96%      Physical Exam Vitals and nursing note reviewed.  Constitutional:      Appearance: Normal appearance.  Cardiovascular:     Rate and Rhythm: Normal rate and regular rhythm.     Heart sounds: Normal heart sounds.  Pulmonary:     Effort: Pulmonary effort is normal.     Breath sounds: Normal breath sounds.  Abdominal:     General: Bowel sounds are normal.  Neurological:     Mental Status: She is alert.      Results  for orders placed or performed in visit on 05/06/24  POCT glycosylated hemoglobin (Hb A1C)  Result Value Ref Range   Hemoglobin A1C 7.6 (A) 4.0 - 5.6 %   HbA1c POC (<> result, manual entry)     HbA1c, POC (prediabetic range)     HbA1c, POC (controlled diabetic range)        The ASCVD Risk score (Arnett DK, et al., 2019) failed to calculate for the following reasons:   Unable to determine if patient is Non-Hispanic African American    Assessment & Plan:   Problem List Items Addressed This Visit       Endocrine   Type 2 diabetes mellitus with hyperglycemia, with long-term current use of  insulin  (HCC) - Primary   Relevant Medications   DULoxetine  (CYMBALTA ) 30 MG capsule   Continuous Glucose Sensor (FREESTYLE LIBRE 3 PLUS SENSOR) MISC   Continuous Glucose Receiver (FREESTYLE LIBRE 3 READER) DEVI   Other Relevant Orders   POCT glycosylated hemoglobin (Hb A1C) (Completed)     Other   Allergies   Relevant Medications   montelukast  (SINGULAIR ) 10 MG tablet   fluticasone  (FLONASE ) 50 MCG/ACT nasal spray   Current severe episode of major depressive disorder without psychotic features (HCC)   Relevant Medications   DULoxetine  (CYMBALTA ) 30 MG capsule   Other Visit Diagnoses       OSA (obstructive sleep apnea)       Relevant Orders   Ambulatory referral to Pulmonology      Assessment and Plan Assessment & Plan Type 2 diabetes mellitus with hyperglycemia A1c increased to 7.6, likely due to holiday indulgences. Difficulty with blood glucose monitoring due to tremors and lack of insurance coverage for supplies. Concerns about hypoglycemia with increased Glipizide  dosage. Decision to maintain current Trulicity  dosage due to recent refill and potential appetite suppression with increased dosage. - Submitted request for [employer] 3 Plus to insurance. - Maintain current Trulicity  dosage at 3 mg weekly. - Encouraged dietary modifications to improve A1c levels. - Monitor blood glucose levels as feasible.  Major depressive disorder, severe episode without psychotic features Persistent lack of motivation and appetite despite current duloxetine  dosage. Symptoms include excessive daytime napping and lack of interest in activities. Decision to increase duloxetine  dosage to address symptoms. - Increased duloxetine  to 60 mg daily. - Referred to pulmonology for evaluation of sleep apnea treatment options.  Obstructive sleep apnea Difficulty tolerating CPAP therapy due to movement and discomfort. Previous trials of oral appliances were ineffective. Discussion of alternative  treatments including Inspire device. - Referred to pulmonology for evaluation of alternative sleep apnea treatments, including Inspire device.  Allergic rhinitis Experiencing postnasal drip leading to gagging and vomiting. No current nasal spray treatment. - Prescribed Flonase  nasal spray for postnasal drip.  General health maintenance Discussion of flu vaccination and upcoming dermatology appointment for nail issues. Emphasis on nutritional support for nail and hair health. - Continue with dermatology appointment in February. - Encouraged nutritional support with calcium , vitamin D , and other supplements.  Return in about 3 months (around 08/04/2024) for DM recheck.    Adina Crandall, NP

## 2024-05-07 ENCOUNTER — Encounter: Payer: Self-pay | Admitting: Nurse Practitioner

## 2024-05-09 ENCOUNTER — Other Ambulatory Visit (HOSPITAL_COMMUNITY): Payer: Self-pay

## 2024-05-09 ENCOUNTER — Encounter: Payer: Self-pay | Admitting: Nurse Practitioner

## 2024-05-09 ENCOUNTER — Telehealth: Payer: Self-pay

## 2024-05-09 NOTE — Telephone Encounter (Signed)
 Pls submit PA for FreeStyle Libre 3 Plus sensor.

## 2024-05-09 NOTE — Telephone Encounter (Unsigned)
 Copied from CRM [id number]. Topic: Clinical - Medication Prior Auth >> May 09, 2024 11:33 AM Amy B wrote: Reason for CRM: Continuous Glucose Sensor (FREESTYLE LIBRE 3 PLUS SENSOR) MISC requires prior authorization.  Please submit prior auth.

## 2024-05-09 NOTE — Telephone Encounter (Signed)
 Pharmacy Patient Advocate Encounter   Received notification from Patient Advice Request messages that prior authorization for [facility] 3 plus sensors is required/requested.   Insurance verification completed.   The patient is insured through [facility].   Per test claim: PA required; PA submitted to above mentioned insurance via Latent Key/confirmation #/EOC [facility] Status is pending

## 2024-05-12 ENCOUNTER — Other Ambulatory Visit (HOSPITAL_COMMUNITY): Payer: Self-pay

## 2024-05-12 NOTE — Telephone Encounter (Signed)
 Pharmacy Patient Advocate Encounter  Received notification from [employer] that Prior Authorization for [facility] 3 plus sensors has been DENIED.  Full denial letter will be uploaded to the media tab. See denial reason below.      PA #/Case ID/Reference #: # [id number]

## 2024-05-13 ENCOUNTER — Other Ambulatory Visit (HOSPITAL_COMMUNITY): Payer: Self-pay

## 2024-05-25 ENCOUNTER — Encounter: Payer: Self-pay | Admitting: Nurse Practitioner

## 2024-05-25 DIAGNOSIS — E1165 Type 2 diabetes mellitus with hyperglycemia: Secondary | ICD-10-CM

## 2024-05-25 DIAGNOSIS — F322 Major depressive disorder, single episode, severe without psychotic features: Secondary | ICD-10-CM

## 2024-05-27 MED ORDER — DULOXETINE HCL 60 MG PO CPEP: 60.0000 mg | ORAL_CAPSULE | Freq: Every day | ORAL | 1 refills | Status: AC

## 2024-05-27 NOTE — Telephone Encounter (Signed)
Ok to send as pended.

## 2024-06-04 ENCOUNTER — Other Ambulatory Visit: Payer: Self-pay | Admitting: Nurse Practitioner

## 2024-06-04 DIAGNOSIS — E1165 Type 2 diabetes mellitus with hyperglycemia: Secondary | ICD-10-CM

## 2024-06-04 DIAGNOSIS — E1169 Type 2 diabetes mellitus with other specified complication: Secondary | ICD-10-CM

## 2024-06-04 DIAGNOSIS — R251 Tremor, unspecified: Secondary | ICD-10-CM

## 2024-06-04 DIAGNOSIS — E039 Hypothyroidism, unspecified: Secondary | ICD-10-CM

## 2024-06-06 ENCOUNTER — Encounter: Payer: Self-pay | Admitting: Nurse Practitioner

## 2024-08-05 ENCOUNTER — Ambulatory Visit: Admitting: Nurse Practitioner

## 2024-08-12 ENCOUNTER — Ambulatory Visit: Admitting: Nurse Practitioner
